# Patient Record
Sex: Female | Born: 1965
Health system: Southern US, Community
[De-identification: ages and names within clinical notes are randomized; demographics above are authoritative.]

## PROBLEM LIST (undated history)

## (undated) DIAGNOSIS — IMO0002 Reserved for concepts with insufficient information to code with codable children: Secondary | ICD-10-CM

## (undated) DIAGNOSIS — F419 Anxiety disorder, unspecified: Secondary | ICD-10-CM

## (undated) DIAGNOSIS — I10 Essential (primary) hypertension: Secondary | ICD-10-CM

## (undated) DIAGNOSIS — I219 Acute myocardial infarction, unspecified: Secondary | ICD-10-CM

## (undated) DIAGNOSIS — I779 Disorder of arteries and arterioles, unspecified: Secondary | ICD-10-CM

## (undated) DIAGNOSIS — Z90710 Acquired absence of both cervix and uterus: Secondary | ICD-10-CM

## (undated) DIAGNOSIS — R9430 Abnormal result of cardiovascular function study, unspecified: Secondary | ICD-10-CM

## (undated) DIAGNOSIS — I739 Peripheral vascular disease, unspecified: Secondary | ICD-10-CM

## (undated) DIAGNOSIS — G479 Sleep disorder, unspecified: Secondary | ICD-10-CM

## (undated) DIAGNOSIS — Z720 Tobacco use: Secondary | ICD-10-CM

## (undated) DIAGNOSIS — Z955 Presence of coronary angioplasty implant and graft: Secondary | ICD-10-CM

## (undated) DIAGNOSIS — G43909 Migraine, unspecified, not intractable, without status migrainosus: Secondary | ICD-10-CM

## (undated) DIAGNOSIS — F32A Depression, unspecified: Secondary | ICD-10-CM

## (undated) DIAGNOSIS — F329 Major depressive disorder, single episode, unspecified: Secondary | ICD-10-CM

## (undated) DIAGNOSIS — E079 Disorder of thyroid, unspecified: Secondary | ICD-10-CM

## (undated) DIAGNOSIS — Z8489 Family history of other specified conditions: Secondary | ICD-10-CM

## (undated) DIAGNOSIS — I2510 Atherosclerotic heart disease of native coronary artery without angina pectoris: Secondary | ICD-10-CM

## (undated) DIAGNOSIS — Z95828 Presence of other vascular implants and grafts: Secondary | ICD-10-CM

## (undated) DIAGNOSIS — R519 Headache, unspecified: Secondary | ICD-10-CM

## (undated) DIAGNOSIS — M1990 Unspecified osteoarthritis, unspecified site: Secondary | ICD-10-CM

## (undated) DIAGNOSIS — E785 Hyperlipidemia, unspecified: Secondary | ICD-10-CM

## (undated) DIAGNOSIS — L0591 Pilonidal cyst without abscess: Secondary | ICD-10-CM

## (undated) HISTORY — PX: CORONARY STENT PLACEMENT: SHX1402

## (undated) HISTORY — PX: FRACTURE SURGERY: SHX138

## (undated) HISTORY — DX: Anxiety disorder, unspecified: F41.9

## (undated) HISTORY — DX: Hyperlipidemia, unspecified: E78.5

## (undated) HISTORY — PX: ABDOMINAL HYSTERECTOMY: SHX81

## (undated) HISTORY — DX: Abnormal result of cardiovascular function study, unspecified: R94.30

## (undated) HISTORY — DX: Atherosclerotic heart disease of native coronary artery without angina pectoris: I25.10

## (undated) HISTORY — DX: Peripheral vascular disease, unspecified: I73.9

## (undated) HISTORY — DX: Disorder of arteries and arterioles, unspecified: I77.9

## (undated) HISTORY — PX: JOINT REPLACEMENT: SHX530

## (undated) HISTORY — DX: Reserved for concepts with insufficient information to code with codable children: IMO0002

## (undated) HISTORY — DX: Disorder of thyroid, unspecified: E07.9

## (undated) HISTORY — DX: Acquired absence of both cervix and uterus: Z90.710

## (undated) HISTORY — DX: Presence of other vascular implants and grafts: Z95.828

## (undated) HISTORY — DX: Tobacco use: Z72.0

---
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
## 1999-06-29 ENCOUNTER — Other Ambulatory Visit: Admission: RE | Admit: 1999-06-29 | Discharge: 1999-06-29 | Payer: Self-pay | Admitting: Obstetrics and Gynecology

## 2002-05-21 ENCOUNTER — Other Ambulatory Visit: Admission: RE | Admit: 2002-05-21 | Discharge: 2002-05-21 | Payer: Self-pay | Admitting: Obstetrics and Gynecology

## 2003-01-16 ENCOUNTER — Encounter (INDEPENDENT_AMBULATORY_CARE_PROVIDER_SITE_OTHER): Payer: Self-pay | Admitting: *Deleted

## 2003-01-16 ENCOUNTER — Ambulatory Visit (HOSPITAL_COMMUNITY): Admission: RE | Admit: 2003-01-16 | Discharge: 2003-01-16 | Payer: Self-pay | Admitting: Obstetrics and Gynecology

## 2004-07-24 DIAGNOSIS — Z955 Presence of coronary angioplasty implant and graft: Secondary | ICD-10-CM

## 2004-07-24 HISTORY — DX: Presence of coronary angioplasty implant and graft: Z95.5

## 2005-12-15 ENCOUNTER — Inpatient Hospital Stay (HOSPITAL_COMMUNITY): Admission: EM | Admit: 2005-12-15 | Discharge: 2005-12-20 | Payer: Self-pay | Admitting: *Deleted

## 2005-12-15 ENCOUNTER — Encounter: Payer: Self-pay | Admitting: Emergency Medicine

## 2006-01-01 ENCOUNTER — Ambulatory Visit: Payer: Self-pay | Admitting: Cardiology

## 2006-02-19 ENCOUNTER — Ambulatory Visit: Payer: Self-pay | Admitting: Cardiology

## 2006-04-19 ENCOUNTER — Ambulatory Visit: Payer: Self-pay | Admitting: Cardiology

## 2006-04-20 ENCOUNTER — Ambulatory Visit: Payer: Self-pay

## 2006-05-11 ENCOUNTER — Ambulatory Visit: Payer: Self-pay | Admitting: Cardiology

## 2006-06-01 ENCOUNTER — Ambulatory Visit: Payer: Self-pay | Admitting: Cardiology

## 2006-11-20 ENCOUNTER — Ambulatory Visit: Payer: Self-pay | Admitting: Cardiology

## 2007-06-07 ENCOUNTER — Ambulatory Visit: Payer: Self-pay | Admitting: Cardiology

## 2007-06-07 ENCOUNTER — Ambulatory Visit: Payer: Self-pay

## 2008-03-04 ENCOUNTER — Emergency Department (HOSPITAL_COMMUNITY): Admission: EM | Admit: 2008-03-04 | Discharge: 2008-03-04 | Payer: Self-pay | Admitting: Emergency Medicine

## 2008-04-01 ENCOUNTER — Ambulatory Visit: Payer: Self-pay

## 2008-04-01 ENCOUNTER — Ambulatory Visit: Payer: Self-pay | Admitting: Cardiology

## 2008-07-27 ENCOUNTER — Encounter: Admission: RE | Admit: 2008-07-27 | Discharge: 2008-07-27 | Payer: Self-pay | Admitting: Obstetrics and Gynecology

## 2008-08-07 ENCOUNTER — Inpatient Hospital Stay (HOSPITAL_COMMUNITY): Admission: RE | Admit: 2008-08-07 | Discharge: 2008-08-09 | Payer: Self-pay | Admitting: Obstetrics and Gynecology

## 2008-08-07 ENCOUNTER — Encounter (INDEPENDENT_AMBULATORY_CARE_PROVIDER_SITE_OTHER): Payer: Self-pay | Admitting: Obstetrics and Gynecology

## 2008-11-05 ENCOUNTER — Encounter: Payer: Self-pay | Admitting: Cardiology

## 2008-11-05 ENCOUNTER — Ambulatory Visit: Payer: Self-pay | Admitting: Cardiology

## 2008-11-05 ENCOUNTER — Ambulatory Visit: Payer: Self-pay

## 2008-11-11 ENCOUNTER — Telehealth (INDEPENDENT_AMBULATORY_CARE_PROVIDER_SITE_OTHER): Payer: Self-pay | Admitting: *Deleted

## 2008-11-12 ENCOUNTER — Encounter: Payer: Self-pay | Admitting: Cardiovascular Disease

## 2008-11-12 ENCOUNTER — Ambulatory Visit: Payer: Self-pay

## 2009-05-03 ENCOUNTER — Telehealth: Payer: Self-pay | Admitting: Cardiology

## 2009-05-26 ENCOUNTER — Encounter: Payer: Self-pay | Admitting: Cardiology

## 2009-05-27 ENCOUNTER — Ambulatory Visit: Payer: Self-pay

## 2009-05-27 ENCOUNTER — Ambulatory Visit: Payer: Self-pay | Admitting: Cardiology

## 2009-09-06 ENCOUNTER — Telehealth: Payer: Self-pay | Admitting: Cardiology

## 2009-10-07 ENCOUNTER — Encounter (INDEPENDENT_AMBULATORY_CARE_PROVIDER_SITE_OTHER): Payer: Self-pay | Admitting: *Deleted

## 2010-02-09 ENCOUNTER — Telehealth: Payer: Self-pay | Admitting: Cardiology

## 2010-03-14 ENCOUNTER — Encounter: Payer: Self-pay | Admitting: Cardiology

## 2010-03-15 ENCOUNTER — Ambulatory Visit: Payer: Self-pay | Admitting: Cardiology

## 2010-03-15 ENCOUNTER — Ambulatory Visit: Payer: Self-pay

## 2010-08-14 ENCOUNTER — Encounter: Payer: Self-pay | Admitting: Obstetrics and Gynecology

## 2010-08-23 NOTE — Assessment & Plan Note (Signed)
Summary: 6 month appt/mt   Visit Type:  Follow-up Primary Provider:  Phillips Odor  CC:  CAD.  History of Present Illness: The patient is seen for followup of coronary artery disease.  She is not having any significant chest pain.  She is under significant stress.  Her mother who is a patient of mine, recently fell and was injured.  She is very worried about this.  She has continued to smoke some.  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  The patient also has severe carotid artery disease.  This is followed very carefully.  Doppler done today reveals stable 60-79% bilateral disease.  Current Medications (verified): 1)  Plavix 75 Mg Tabs (Clopidogrel Bisulfate) .... Take 1 Tab By Mouth Daily 2)  Benazepril Hcl 5 Mg Tabs (Benazepril Hcl) .Marland Kitchen.. 1 Tablet By Mouth Daily 3)  Metoprolol Tartrate 25 Mg Tabs (Metoprolol Tartrate) .... Two Times A Day 4)  Aspirin 81 Mg  Tbec (Aspirin) .... 2 By Mouth Every Day 5)  Vytorin 10-40 Mg Tabs (Ezetimibe-Simvastatin) .Marland Kitchen.. 1 By Mouth Daily 6)  Alprazolam 0.5 Mg  Tabs (Alprazolam) .... Take 1 Tablet By Mouth Once A Day 7)  Nitroglycerin 0.4 Mg Subl (Nitroglycerin) .... Take 1 Tab Sl As Needed Chest Pain 8)  Metformin Hcl 500 Mg Tabs (Metformin Hcl) .Marland Kitchen.. 1 Two Times A Day 9)  Triazolam 0.25 Mg Tabs (Triazolam) .... At Bedtime As Needed  Allergies (verified): 1)  ! Pcn  Past History:  Past Medical History: CAD...cath..11/2005..DES and aspiration throbbectomy  RCA  for ST MI  /  myoview  10/2008..53%...inferior scar...no ischemia EF  55%  cath...11/2005..mild inferior hypokinesis Carotid artery disease...   doppler.Marland KitchenMarland KitchenNovember, 2010.Marland KitchenMarland Kitchen60-79% bilateral  ..stable /   Doppler.. August, 2011... stable... 60-79% bilateral disease Obesity.   smoking.   Dyslipidemia hysterectomy for very large fibroids.... 2010 Anxiety  Review of Systems       Patient denies fever, chills, headache, sweats, rash, change in vision, change in hearing, chest pain, cough, nausea  vomiting, urinary symptoms.  All other systems are reviewed and are negative.  Vital Signs:  Patient profile:   45 year old female Height:      67 inches Weight:      207 pounds BMI:     32.54 Pulse rate:   80 / minute BP sitting:   116 / 70  (left arm) Cuff size:   regular  Vitals Entered By: Hardin Negus, RMA (March 15, 2010 10:03 AM)  Physical Exam  General:  patient is stable today but tearful about her mother. Head:  head is atraumatic. Eyes:  no xanthelasma. Neck:  carotid bruits are present. Chest Wall:  no chest wall tenderness. Lungs:  lungs are clear.  Respiratory effort is nonlabored. Heart:  cardiac exam reveals S1-S2.  No clicks or significant murmurs. Abdomen:  abdomen is soft. Msk:  no musculoskeletal deformities. Extremities:  no peripheral edema. Skin:  no skin rashes. Psych:  patient is oriented to person time and place.  Affect is normal.   Impression & Recommendations:  Problem # 1:  * ANXIETY The patient has felt anxious and has historically used Xanax.  Recently she was given some Halcion to help her sleep.  I have encouraged her to use a Halcion on a very limited basis so that she does not develop reflex insomnia. I discussed the use of these medications with her at length.  Problem # 2:  TOBACCO ABUSE (ICD-305.1) Unfortunately she continues to smoke.  She is doing her  best to try to limit this.  I counseled her about this.  Problem # 3:  HYPERLIPIDEMIA (ICD-272.4)  Her updated medication list for this problem includes:    Vytorin 10-40 Mg Tabs (Ezetimibe-simvastatin) .Marland Kitchen... 1 by mouth daily The patient's lipids are treated.  Problem # 4:  CAROTID ARTERY STENOSIS, BILATERAL (ICD-433.10)  Her updated medication list for this problem includes:    Plavix 75 Mg Tabs (Clopidogrel bisulfate) .Marland Kitchen... Take 1 tab by mouth daily    Aspirin 81 Mg Tbec (Aspirin) .Marland Kitchen... 2 by mouth every day Doppler today reveals significant but stable carotid disease.  She  will have a followup study in 6 months.I reviewed the carotid Doppler report very carefully.  Problem # 5:  OVERWEIGHT (ICD-278.02) Weight loss would be helpful.  Problem # 6:  CAD (ICD-414.00)  Her updated medication list for this problem includes:    Plavix 75 Mg Tabs (Clopidogrel bisulfate) .Marland Kitchen... Take 1 tab by mouth daily    Benazepril Hcl 5 Mg Tabs (Benazepril hcl) .Marland Kitchen... 1 tablet by mouth daily    Metoprolol Tartrate 25 Mg Tabs (Metoprolol tartrate) .Marland Kitchen..Marland Kitchen Two times a day    Aspirin 81 Mg Tbec (Aspirin) .Marland Kitchen... 2 by mouth every day    Nitroglycerin 0.4 Mg Subl (Nitroglycerin) .Marland Kitchen... Take 1 tab sl as needed chest pain Coronary disease is stable.  No change in therapy at this time.  Patient Instructions: 1)  Your physician wants you to follow-up in:  6 months.  You will receive a reminder letter in the mail two months in advance. If you don't receive a letter, please call our office to schedule the follow-up appointment.

## 2010-08-23 NOTE — Miscellaneous (Signed)
  Clinical Lists Changes  Observations: Added new observation of PRIMARY MD: Assunta Found, MD (03/15/2010 13:27)

## 2010-08-23 NOTE — Progress Notes (Signed)
Summary: plavix / vytorin / benazepril  Phone Note Refill Request Message from:  Patient on February 09, 2010 3:39 PM  Refills Requested: Medication #1:  PLAVIX 75 MG TABS Take 1 tab by mouth daily  Medication #2:  VYTORIN 10-40 MG TABS 1 by mouth daily  Medication #3:  BENAZEPRIL HCL 5 MG TABS 1 tablet by mouth daily  Medication #4:  METFORMIN HCL 500 MG TABS 1 two times a day. [facility] [phone]  Initial call taken by: Judie Grieve,  February 09, 2010 3:40 PM  Follow-up for Phone Call        lmtcb metformin needs to be filled by PCP not cardiology. Hardin Negus, RMA  February 10, 2010 9:36 AM  spoke with Pt she understands  Follow-up by: Hardin Negus, RMA,  February 10, 2010 4:46 PM    Prescriptions: VYTORIN 10-40 MG TABS (EZETIMIBE-SIMVASTATIN) 1 by mouth daily  #90 x 2   Entered by:   Hardin Negus, RMA   Authorized by:   Talitha Givens, MD, [facility]   Signed by:   Hardin Negus, RMA on 02/10/2010   Method used:   Faxed to ...       [facility] (mail-order)             , [place]         Ph: [phone]       Fax: [phone]   RxID:   [id number] BENAZEPRIL HCL 5 MG TABS (BENAZEPRIL HCL) 1 tablet by mouth daily  #90 x 2   Entered by:   Hardin Negus, RMA   Authorized by:   Talitha Givens, MD, [facility]   Signed by:   Hardin Negus, RMA on 02/10/2010   Method used:   Faxed to ...       [facility] (mail-order)             , [place]         Ph: [phone]       Fax: [phone]   RxID:   [id number] PLAVIX 75 MG TABS (CLOPIDOGREL BISULFATE) Take 1 tab by mouth daily  #90 x 2   Entered by:   Hardin Negus, RMA   Authorized by:   Talitha Givens, MD, [facility]   Signed by:   Hardin Negus, RMA on 02/10/2010   Method used:   Faxed to ...       [facility] (mail-order)             , [place]         Ph: [phone]       Fax: [phone]   RxID:   [id number]

## 2010-08-23 NOTE — Progress Notes (Signed)
Summary: pt needs refill asap today  Phone Note Refill Request Call back at Work Phone [phone] Message from:  Patient  Refills Requested: Medication #1:  PLAVIX 75 MG TABS Take 1 tab by mouth daily per pt they have faxed several times and she only has six pills left and needs this refill called in today and please call her before five today at her work number listed above  Initial call taken by: Omer Jack,  September 06, 2009 2:11 PM    Prescriptions: PLAVIX 75 MG TABS (CLOPIDOGREL BISULFATE) Take 1 tab by mouth daily  #90 x 2   Entered by:   Hardin Negus, RMA   Authorized by:   Talitha Givens, MD, [facility]   Signed by:   Hardin Negus, RMA on 09/06/2009   Method used:   Electronically to        [facility]* (mail-order)             ,          Ph: [phone]       Fax: [phone]   RxID:   [id number]

## 2010-08-23 NOTE — Letter (Signed)
Summary: Appointment - Reminder 2  [facility], Main Office  [address]   [place], [place] [address]   Phone: [phone]  Fax: [phone]     October 07, 2009 MRN: [id number]   NAILA ELIZONDO [address] [place], [place]  [address]   Dear Ms. Radloff,  Our records indicate that it is time to schedule a follow-up appointment with Dr. Myrtis Ser. It is very important that we reach you to schedule this appointment. We look forward to participating in your health care needs. Please contact us at the number listed above at your earliest convenience to schedule your appointment.  If you are unable to make an appointment at this time, give [place] a call so we can update our records.     Sincerely,   Migdalia Dk [facility] Scheduling Team

## 2010-08-23 NOTE — Miscellaneous (Signed)
Summary: Orders Update  Clinical Lists Changes  Orders: Added new Test order of Carotid Duplex (Carotid Duplex) - Signed 

## 2010-09-13 ENCOUNTER — Encounter: Payer: Self-pay | Admitting: Cardiology

## 2010-09-14 ENCOUNTER — Encounter (INDEPENDENT_AMBULATORY_CARE_PROVIDER_SITE_OTHER): Payer: 59

## 2010-09-14 ENCOUNTER — Encounter: Payer: Self-pay | Admitting: Cardiology

## 2010-09-14 ENCOUNTER — Ambulatory Visit (INDEPENDENT_AMBULATORY_CARE_PROVIDER_SITE_OTHER): Payer: 59 | Admitting: Cardiology

## 2010-09-14 DIAGNOSIS — I2510 Atherosclerotic heart disease of native coronary artery without angina pectoris: Secondary | ICD-10-CM

## 2010-09-14 DIAGNOSIS — I6529 Occlusion and stenosis of unspecified carotid artery: Secondary | ICD-10-CM

## 2010-09-20 NOTE — Miscellaneous (Signed)
  Clinical Lists Changes  Observations: Added new observation of PAST MED HX: CAD...cath..11/2005..DES and aspiration throbbectomy  RCA  for ST MI  /  myoview  10/2008..53%...inferior scar...no ischemia EF  55%  cath...11/2005..mild inferior hypokinesis  /   EF 53%.. nuclear... April, 2010 Carotid artery disease...   doppler.Marland KitchenMarland KitchenNovember, 2010.Marland KitchenMarland Kitchen60-79% bilateral  ..stable /   Doppler.. August, 2011... stable... 60-79% bilateral disease Obesity.   smoking.   Dyslipidemia hysterectomy for very large fibroids.... 2010 Anxiety (09/13/2010 8:29) Added new observation of PRIMARY MD: Assunta Found, MD (09/13/2010 8:29)       Past History:  Past Medical History: CAD...cath..11/2005..DES and aspiration throbbectomy  RCA  for ST MI  /  myoview  10/2008..53%...inferior scar...no ischemia EF  55%  cath...11/2005..mild inferior hypokinesis  /   EF 53%.. nuclear... April, 2010 Carotid artery disease...   doppler.Marland KitchenMarland KitchenNovember, 2010.Marland KitchenMarland Kitchen60-79% bilateral  ..stable /   Doppler.. August, 2011... stable... 60-79% bilateral disease Obesity.   smoking.   Dyslipidemia hysterectomy for very large fibroids.... 2010 Anxiety

## 2010-09-20 NOTE — Miscellaneous (Signed)
Summary: Orders Update  Clinical Lists Changes  Orders: Added new Test order of Carotid Duplex (Carotid Duplex) - Signed 

## 2010-09-20 NOTE — Assessment & Plan Note (Signed)
Summary: [name]   Visit Type:  Follow-up Primary Provider:  Assunta Found, MD  CC:  CAD.  History of Present Illness: Patient is seen for followup of coronary artery disease.  I saw her last August, 2011.  First we discussed her mother who is my patient.  The patient did not have any chest pain.  She is trying very hard to stop smoking.  She has enormous stress at work.  She is trying to cut down to one cigarette of the time.  Currently she smokes in the range of 10 cigarettes per day.  Recently she had a cyst on her back that was drained.  Current Medications (verified): 1)  Plavix 75 Mg Tabs (Clopidogrel Bisulfate) .... Take 1 Tab By Mouth Daily 2)  Benazepril Hcl 5 Mg Tabs (Benazepril Hcl) .Marland Kitchen.. 1 Tablet By Mouth Daily 3)  Metoprolol Tartrate 25 Mg Tabs (Metoprolol Tartrate) .... Two Times A Day 4)  Aspirin 81 Mg  Tbec (Aspirin) .... 2 By Mouth Every Day 5)  Vytorin 10-40 Mg Tabs (Ezetimibe-Simvastatin) .Marland Kitchen.. 1 By Mouth Daily 6)  Alprazolam 0.5 Mg  Tabs (Alprazolam) .... Take 1 Tablet By Mouth Once A Day 7)  Nitroglycerin 0.4 Mg Subl (Nitroglycerin) .... Take 1 Tab Sl As Needed Chest Pain 8)  Metformin Hcl 500 Mg Tabs (Metformin Hcl) .Marland Kitchen.. 1 Two Times A Day 9)  Triazolam 0.25 Mg Tabs (Triazolam) .... At Bedtime As Needed 10)  Bactrim Ds 800-160 Mg Tabs (Sulfamethoxazole-Trimethoprim) .... Two Times A Day  Allergies: 1)  ! Pcn  Past History:  Past Medical History: CAD...cath..11/2005..DES and aspiration throbbectomy  RCA  for ST MI  /  myoview  10/2008..53%...inferior scar...no ischemia EF  55%  cath...11/2005..mild inferior hypokinesis  /   EF 53%.. nuclear... April, 2010 Carotid artery disease...   doppler.Marland KitchenMarland KitchenNovember, 2010.Marland KitchenMarland Kitchen60-79% bilateral  ..stable /   Doppler.. August, 2011... stable... 60-79% bilateral disease Obesity.   smoking.   Dyslipidemia hysterectomy for very large fibroids.... 2010 Anxiety..  Review of Systems       Patient denies fever, chills, headache, sweats,  rash, change in vision, change in hearing, chest pain, cough, nausea vomiting, urinary symptoms.  All other systems are reviewed and are negative.  Vital Signs:  Patient profile:   45 year old female Height:      67 inches Weight:      209 pounds BMI:     32.85 Pulse rate:   81 / minute BP sitting:   110 / 70  (left arm)  Vitals Entered By: Laurance Flatten CMA (September 14, 2010 9:12 AM)  Physical Exam  General:  The patient is stable but overweight. Head:  head is atraumatic. Eyes:  no xanthelasma. Neck:  no jugular venous distention. Chest Wall:  no chest wall tenderness. Lungs:  lungs are clear.  Respiratory effort is unlabored. Heart:  cardiac exam reveals an S1-S2.  No clicks or significant murmurs or Abdomen:  abdomen is soft. Msk:  no musculoskeletal deformities. Extremities:  no peripheral edema. Skin:  no skin rashes.  The lesion on her back is treated. Psych:  patient is oriented to person time and place.  Affect is normal.   Impression & Recommendations:  Problem # 1:  * ANXIETY The patient has a multitude of stresses.  There was an unexpected death in the family who was found to weeks after death.  She has significant stress at work.  She is doing well in general.  These stresses affect her ability to stop smoking.  We discussed this at length.  Problem # 2:  TOBACCO ABUSE (ICD-305.1) As outlined above she is trying hard to stop smoking.  Problem # 3:  CAROTID ARTERY STENOSIS, BILATERAL (ICD-433.10) Carotid disease is followed very carefully.  Problem # 4:  CAD (ICD-414.00) Coronary disease is stable.  EKG is done today and reviewed by me.  I compared with prior tracing.  There are old inferior Q waves.  No significant change. No further workup is needed at this time. Her nitroglycerin will be refilled.  Problem # 5:  HYPERLIPIDEMIA (ICD-272.4) Lipids are treated.  No change in therapy.  I will see her back in 6 months for followup.  Other Orders: EKG w/  Interpretation (93000)  Patient Instructions: 1)  Your physician recommends that you schedule a follow-up appointment in: 6 months with Dr. Myrtis Ser 2)  Your physician recommends that you continue on your current medications as directed. Please refer to the Current Medication list given to you today. Prescriptions: NITROGLYCERIN 0.4 MG SUBL (NITROGLYCERIN) Take 1 tab SL as needed chest pain  #25 x 6   Entered by:   Dossie Arbour, RN, BSN   Authorized by:   Talitha Givens, MD, [facility]   Signed by:   Dossie Arbour, RN, BSN on 09/14/2010   Method used:   Electronically to        [facility] [address] * (retail)       [address]       [place], [place]  [address]       Ph: [phone] or [phone]       Fax: [phone]   RxID:   [id number]

## 2010-09-30 ENCOUNTER — Telehealth: Payer: Self-pay | Admitting: Cardiology

## 2010-10-11 NOTE — Progress Notes (Signed)
Summary: pt having dental work need to stop some meds  Phone Note Call from Patient Call back at Work Phone [phone]   Caller: Patient Summary of Call: Pt having work on Monday and need to speak with someone about stopping meidcation prior to the dental work Initial call taken by: Judie Grieve,  September 30, 2010 9:29 AM  Follow-up for Phone Call        Phone Call Completed  PER DR Aailyah Dunbar PT  MAY HOLD PLAVIX ONLY PRIOR TO DENTAL WORK DR BEAVERS OFF AWARE AS WELL AS PT . Follow-up by: Scherrie Bateman, LPN,  September 30, 2010 10:01 AM

## 2010-11-07 LAB — COMPREHENSIVE METABOLIC PANEL
Albumin: 4.2 g/dL (ref 3.5–5.2)
Alkaline Phosphatase: 49 U/L (ref 39–117)
BUN: 4 mg/dL — ABNORMAL LOW (ref 6–23)
Calcium: 10.2 mg/dL (ref 8.4–10.5)
Glucose, Bld: 90 mg/dL (ref 70–99)
Potassium: 3.5 mEq/L (ref 3.5–5.1)
Sodium: 136 mEq/L (ref 135–145)
Total Protein: 7.5 g/dL (ref 6.0–8.3)

## 2010-11-07 LAB — URINALYSIS, ROUTINE W REFLEX MICROSCOPIC
Nitrite: NEGATIVE
Protein, ur: NEGATIVE mg/dL
Specific Gravity, Urine: <1.005 — ABNORMAL LOW (ref 1.005–1.030)
Urobilinogen, UA: 0.2 mg/dL (ref 0.0–1.0)

## 2010-11-07 LAB — DIFFERENTIAL
Basophils Relative: 0 % (ref 0–1)
Lymphocytes Relative: 32 % (ref 12–46)
Lymphs Abs: 4.3 10*3/uL — ABNORMAL HIGH (ref 0.7–4.0)
Monocytes Absolute: 1 10*3/uL (ref 0.1–1.0)
Monocytes Relative: 7 % (ref 3–12)
Neutro Abs: 7.9 10*3/uL — ABNORMAL HIGH (ref 1.7–7.7)
Neutrophils Relative %: 59 % (ref 43–77)

## 2010-11-07 LAB — CROSSMATCH
ABO/RH(D): A POS
Antibody Screen: NEGATIVE

## 2010-11-07 LAB — PROTIME-INR: INR: 0.9 (ref 0.00–1.49)

## 2010-11-07 LAB — CBC
HCT: 31 % — ABNORMAL LOW (ref 36.0–46.0)
Hemoglobin: 6.9 g/dL — CL (ref 12.0–15.0)
MCHC: 31.3 g/dL (ref 30.0–36.0)
MCHC: 31 g/dL (ref 30.0–36.0)
Platelets: 422 10*3/uL — ABNORMAL HIGH (ref 150–400)
Platelets: 284 10*3/uL (ref 150–400)
RBC: 3.1 MIL/uL — ABNORMAL LOW (ref 3.87–5.11)
RDW: 17.9 % — ABNORMAL HIGH (ref 11.5–15.5)
RDW: 17.8 % — ABNORMAL HIGH (ref 11.5–15.5)

## 2010-11-07 LAB — HEMOGLOBIN AND HEMATOCRIT, BLOOD
HCT: 27.7 % — ABNORMAL LOW (ref 36.0–46.0)
Hemoglobin: 8.4 g/dL — ABNORMAL LOW (ref 12.0–15.0)

## 2010-11-07 LAB — APTT: aPTT: 27 seconds (ref 24–37)

## 2010-11-07 LAB — ABO/RH: ABO/RH(D): A POS

## 2010-12-06 NOTE — Assessment & Plan Note (Signed)
[facility]                            CARDIOLOGY OFFICE NOTE   NAME:[name], [name]                        MRN:          [id number]  DATE:04/01/2008                            DOB:          03-09-1966    Baldo Ash (daughter of Darol Destine) is here for Cardiology  followup.  She has known significant vascular disease.  There has been  enormous stress in her family.  Her mother Darol Destine) has a tumor  around her spine for which she had surgery and is now getting  chemotherapy.  In addition, she has an uncle who died suddenly of  cardiac disease and at the funeral, another uncle had a heart attack.  With this unfortunately, she has returned to smoking some.  However, she  is on Wellbutrin and working with Dr. Nobie Putnam, and trying very hard to  stop again.   She is not having any chest pain or significant shortness of breath.  We  spoke at length about stopping smoking.  We also spoke about her lipids.  I will obtain a copy of that data from Dr. Nobie Putnam.  He may have faxed  already, but it is not available to me at this time.   PAST MEDICAL HISTORY:   ALLERGIES:  PENICILLIN.   MEDICATIONS:  Plavix, benazepril, metoprolol, aspirin, Vytorin,  Wellbutrin, and Xanax.   OTHER MEDICAL PROBLEMS:  See the complete list on my note of June 07, 2007.   REVIEW OF SYSTEMS:  She actually is feeling well and is having no  significant problems other than the HPI.  Latese was noted that she had  some drooping of the left face.  She was seen urgently with a CT scan of  the head revealing no marked abnormality and a clinical diagnosis made  of Bell palsy.  It is already improving.   PHYSICAL EXAMINATION:  VITAL SIGNS:  Weight today is 221 pounds, which  is increased by 3 pounds since her last visit.  Blood pressure is 110/70  with a pulse of 88.  GENERAL:  The patient is oriented to person, time, and place.  Affect is  normal.  She is  significantly overweight.  HEENT:  Mild xanthelasma.  She has normal extraocular motion.  Examination of the patient's face reveals that there is very slight  differentiation between the left and right side of her mouth.  This is  quite subtle.  NECK:  There are no carotid bruits.  There is no jugular venous  distention.  LUNGS:  Clear.  Respiratory effort is not labored.  CARDIAC:  S1 with an S2.  There are no clicks or significant murmurs.  ABDOMEN:  Soft.  EXTREMITIES:  She has no peripheral edema.  the patient did have an  episode of Bell palsy.   LABORATORY DATA:  EKG reveals no significant change.  The patient had a  carotid Doppler today.  There is a preliminary result stating that she  has 60-79% bilateral stenoses with no significant change, but she needs  early followup in 6 months and this will be arranged.  PROBLEMS:  1. Coronary artery disease.  She is stable with no change in her meds.  2. Obesity.  She clearly needs to lose weight.  3. History of cigarette smoking.  She had stopped and has restarted      and is trying very hard again to stop.  4. Bilateral carotid stenoses.  This appears to be stable and we need      continued aggressive secondary prevention.  5. Elevated lipids.  We need her labs from Dr. Geanie Logan.  Also, a      fish oil is recommended with the dose as high as 4 g combined of      EPA and DHA.     Luis Abed, MD, [facility]  Electronically Signed    JDK/MedQ  DD: 04/01/2008  DT: 04/01/2008  Job #: [id number]   cc:   Patrica Duel, M.D.

## 2010-12-06 NOTE — Assessment & Plan Note (Signed)
[facility]                            CARDIOLOGY OFFICE NOTE   NAME:Ebeling, ALYZAE HAWKEY                        MRN:          [id number]  DATE:06/07/2007                            DOB:          1966/04/14    Ms. Mcniel is the daughter of Darol Destine seen for cardiology  followup.  She is stable.  She is not having any chest pain. We are  following her carotid Doppler's very carefully.  She had a study done  today and her 60-79% stenoses are stable.  She has a loud right carotid  bruit.  She is not having any chest pain.  She is going about her full  activities. She continues to have some GYN problems and eventually she  may have surgery.  We are not at a point where Plavix can be held when  she needs surgery.  However, she has said that she clearly wants to lose  some weight before she undertakes anything else.   PAST MEDICAL HISTORY:   ALLERGIES:  PENICILLIN.   MEDICATIONS:  Plavix, benazepril, Metoprolol, aspirin and Vytorin 10/40.   OTHER MEDICAL PROBLEMS:  See the list below.   REVIEW OF SYSTEMS:  She does have difficulty with some GYN issues and  she needs a partial hysterectomy and she will be cleared to have this  from the cardia viewpoint over time.  We are far enough out now that  Plavix will be able to be stopped when she needs the procedure.  Otherwise her review of systems is negative.   PHYSICAL EXAMINATION:  Weight is 217 pounds. She has lost some weight  and then gained it again and she says she is now motivated although it  is very difficult while she attends school and works.  Blood pressure  126/82 with a pulse of 85.  The patient is oriented to person, time and  place and her affect is normal.  HEENT:  Reveals slight xanthelasma. She has normal extraocular motion.  There is a loud right carotid bruit.  There is no jugular venous  distension.  LUNGS:  Clear. Respiratory effort is not labored.  CARDIAC EXAM:  Reveals an S1  with an S2.  There are no clicks or  significant murmurs.  ABDOMEN:  Obese but soft.  EXTREMITIES:  She has no significant peripheral edema.   PROBLEMS:  1. Coronary disease.  We need to be very aggressive with her      treatment.  She had a Cypher stent. She is now greater than 1 year      out from this.  We will be able to hold Plavix for any needed      surgical procedures but otherwise I want to continue her Plavix.  2. Obesity.  She clearly needs to lose weight.  3. Hypertension - controlled.  4. History of cigarette use that was stopped.  5. Situational depression after her MI which clearly has improved.  6. History of a cough which is improved. At one point there was a      question that it could be from  an ACE but she is on benazepril and      she does not have an ACE cough.  7. Bilateral 60-79% carotid stenoses.  There is very careful followup      of this.  8. Potential need for GYN surgery.  When she and Dr. Donovan Kail are      ready for this she can be cleared to hold her Plavix at that time.  9. Elevated lipids - on Vytorin now 10/40.   I have not changed her medications.  We can stop her Plavix if and when  she needs the GYN procedure.  She will have a flu shot today. She will  have carotid Doppler followup in 6 months.     Luis Abed, MD, [facility]  Electronically Signed    JDK/MedQ  DD: 06/07/2007  DT: 06/07/2007  Job #: [id number]   cc:   Patrica Duel, M.D.  Miguel Aschoff, M.D.

## 2010-12-06 NOTE — Op Note (Signed)
NAME:  [name], [name]                 ACCOUNT NO.:  [id number]   MEDICAL RECORD NO.:  [id number]          PATIENT TYPE:  INP   LOCATION:  9371                          FACILITY:  [facility]   PHYSICIAN:  Miguel Aschoff, M.D.       DATE OF BIRTH:  08-06-1965   DATE OF PROCEDURE:  DATE OF DISCHARGE:                               OPERATIVE REPORT   PREOPERATIVE DIAGNOSES:  Massive uterine fibroids, menorrhagia.   POSTOPERATIVE DIAGNOSES:  Massive uterine fibroids, menorrhagia.   PROCEDURE:  Total abdominal hysterectomy and right salpingo-  oophorectomy.   SURGEON:  Miguel Aschoff, MD   ASSISTANT:  Luvenia Redden, MD   ANESTHESIA:  General.   COMPLICATIONS:  None.   JUSTIFICATION:  The patient is a 45 year old white female noted to have  a very large fibroids, 18 to 20 weeks' equivalent size associated with  menorrhagia.  Attempts had been made in the past to control her  menorrhagia conservatively with Mirena IUD because of a history of prior  myocardial infarction; however, at this point the fibroids have now  become so large, and the pressure and pain associated with them has  reached a degree that the patient has requested definitive therapy with  hysterectomy.  The patient has received cardiology clearance for this  procedure.  The risks and benefits were discussed with the patient.  Informed consent has been obtained.   PROCEDURE:  The patient was taken to the operating room, placed in the  supine position.  General anesthesia was administered without  difficulty.  She was then placed in the supine position, prepped and  draped in the usual sterile fashion.  Foley catheter was inserted.  At  this point, a midline incision was made from the symphysis pubis to the  umbilicus, extended down through subcutaneous tissue, and bleeding  points were clamped and coagulated as they were encountered.  The fascia  was then identified and incised vertically.  Once this was done, the  midline was  found.  Peritoneum was revealed and entered carefully  avoiding underlying structures.  Peritoneal incision was then extended  under direct visualization.  On entering the abdomen, again it was  obvious that the patient had previous massive fibroids again extending  to the umbilicus.  A self-retaining retractor was placed through the  wound.  It was repacked out of the pelvis.  At this point, the utero-  ovarian ligaments were identified, doubly clamped, cut, and suture  ligated using suture ligatures of 0 Vicryl.  The round ligaments,  fallopian tubes were clamped in a similar fashion, cut, and suture  ligated using suture ligatures of 0 Vicryl.  Additional bites were then  taken along these large fibroids by clamping the broad ligament  structures.  All pedicles were cut and suture ligated using suture  ligatures of 0 Vicryl.  It was possible to continue the dissection down  to the level of the uterine vessels.  These were then clamped with  curved Heaney clamps, and at this point, the fundus was excised to allow  better visualization and to allow the  cervix to be removed.  These were  repacked out of the pelvis once the fundus was excised.  Kocher clamps  were used to elevate the uterus, and then using straight Heaney clamps  the paracervical fascia was clamped, cut, and suture ligated using  suture ligatures of 0 Vicryl.  The cardinal ligaments were clamped, cut,  and suture ligated in similar fashion followed by the uterosacral  ligaments.  Once this was done, it was possible to excise the cervix  from the vaginal fornices.  The vagina was then elevated and closed  using running interlocking 0 Vicryl suture.  Inspection was made for  hemostasis.  There appeared to be some oozing in the area of the right  ovary as well as what appeared to be a very cyanotic fallopian tube.  In  an effort to avoid any postoperative complications, I elected to proceed  with removal of the right tube and  ovary.  The left tube and ovary were  completely within normal limits.  The infundibulopelvic ligament was  then identified, elevated, clamped, cut, and then doubly ligated using  two ligatures of 0 Vicryl.  The uterine specimen and cervix weighed more  than 1600 g.  At this point, a final inspection was made for hemostasis.  Hemostasis appeared to be excellent.  Lap counts and instrument counts  were then taken and found to be correct, and it was elected to close the  abdomen.  The parietal peritoneum was closed using running interlocking  0 Vicryl suture.  The fascia was closed using double-stranded PDS in a  running continuous fashion.  Subcutaneous tissue was closed using  interrupted 0 plain gut.  The skin incision was closed using staples.  The estimated blood loss from the procedure was 350 mL.  The patient  tolerated the procedure well.  Because of the patient's history of  myocardial infarction and what appeared to be ST-segment depression, at  the end of the procedure the patient will be observed in the ICU unit,  and if cardiology consultation is needed, her cardiologist, Dr. Willa Rough, will be contacted.      Miguel Aschoff, M.D.  Electronically Signed     AR/MEDQ  D:  08/07/2008  T:  08/08/2008  Job:  [id number]

## 2010-12-07 ENCOUNTER — Other Ambulatory Visit: Payer: Self-pay | Admitting: *Deleted

## 2010-12-07 MED ORDER — EZETIMIBE-SIMVASTATIN 10-40 MG PO TABS: 1.0000 | ORAL_TABLET | Freq: Every day | ORAL | Status: DC

## 2010-12-09 NOTE — Assessment & Plan Note (Signed)
[facility]                              CARDIOLOGY OFFICE NOTE   [name], [name]                        MRN:          [id number]  DATE:02/19/2006                            DOB:          01-19-1966    [name] is seen for follow-up.  She is not having any significant  recurring angina.  She underwent staged procedure to occluded right coronary  artery, followed by opening of her LAD in late May 2007.  She stopped  smoking.  She has had significant weight gain.  She has had some depression  but she is doing better.   PAST MEDICAL HISTORY:   ALLERGIES:  PENICILLIN.   MEDICATIONS:  1.  Plavix 75 mg.  2.  Benazepril (to be changed to Cozaar).  3.  Metoprolol 25 mg b.i.d.  4.  Zocor 40 mg.  5.  Aspirin 325 mg.   OTHER MEDICAL PROBLEMS:  See the list below.   REVIEW OF SYSTEMS:  She has had some vague symptoms but is doing well.  She  does have a dry cough that could possibly be ACE inhibitor-related.  Also,  she has weight gain.  We want to get her to cardiac rehab soon for multiple  reasons.  Otherwise, her review of systems is negative.   PHYSICAL EXAMINATION:  VITAL SIGNS:  Blood pressure is 120/80, pulse is 86.  GENERAL:  The patient is oriented to person, time and place, and her affect  was normal.  LUNGS:  Normal.  Respiratory effort is not labored.  HEENT:  No xanthelasma.  She has normal extraocular motion.  There is no  carotid bruit.  There is no jugular venous distention.  CARDIAC:  An S1 with an S2.  She has no clicks or significant murmurs.  ABDOMEN:  Obese but soft.  EXTREMITIES:  She has no significant peripheral edema.   No labs are done.   PROBLEMS:  1.  Coronary artery disease, premature at age 45.  She is on lipid-lowering      agents.  I need to check her lipids to make decisions about her dosing.  2.  Status post totally occluded right coronary with collaterals and urgent      intervention with a Cypher stent.  3.  Status post staged procedure on Dec 19, 2005, with percutaneous coronary      intervention to the left anterior descending artery.  4.  Obesity.  She is gaining weight, and I hope we can help her with this      with nutritional help.  5.  History of hypertension.  6.  Cigarette abuse, which she stopped.  7.  Situational depression, which is improving.  8.  Cough, question ACE-related.  We will probably change her to Cozaar.                               Luis Abed, MD, [facility]    JDK/MedQ  DD:  02/19/2006  DT:  02/20/2006  Job #:  [id number]  cc:   Patrica Duel, MD

## 2010-12-09 NOTE — Assessment & Plan Note (Signed)
[name]                              CARDIOLOGY OFFICE NOTE   NAME:[name], [name]                        MRN:          [id number]  DATE:04/19/2006                            DOB:          October 13, 1965    [name] is seen for followup.  I know her well concerning her coronary  disease and also her mother [name] that we followcarefully.)  In  May 2007 the patient had a staged procedure that included treating an  occluded right coronary artery and then a staged procedure of opening her  LAD.  She did receive a drug-eluting stent.  She stopped smoking.  She is  trying to begin to lose weight.  Her depression is improving.  She is going  to cardiac rehab and learning about her diet and other issues.  She is not  having any chest pain or shortness of breath.  I had a long discussion with  her about the fact that she needs to have a GYN surgery.  See the discussion  below.   PAST MEDICAL HISTORY:   ALLERGIES:  PENICILLIN.   MEDICATION:  1. Plavix 75.  2. Benazepril 5.  3. Metoprolol 25 b.i.d.  4. Aspirin 325.   OTHER MEDICAL PROBLEMS:  See the list below.   REVIEW OF SYSTEMS:  She does not have any chest pain.  She is having  significant GYN symptoms and this has been a chronic problem.  She is now  willing to consider approaching this with possible surgery and she is to see  Dr. Miguel [name] about this.  Otherwise her review of systems is negative.   PHYSICAL EXAMINATION:  Blood pressure in her left arm with a large cuff is  110/76.  Her pulse is 82.  Patient is oriented to person, time and place.  Affect is normal.  LUNGS:  Are clear. Respiratory effort is not labored.  HEENT:  Reveals no xanthelasma.  She has normal extraocular motion.  Today I hear a bruit in the right carotid that I have not heard before.  She  will need a Doppler for this.  There is no jugular venous distention.  CARDIAC:  Reveals an S1 with an S2.  There are  no clicks or significant  murmurs.  ABDOMEN:  Is soft.  There are no masses or bruits.  Her weight today is 207 pounds the same as at the time of her last visit.  She has no peripheral edema.  There are no musculoskeletal deformities.  NEUROLOGIC:  Is grossly intact.   No labs are done today.   PROBLEMS:  1. Coronary disease in a young patient.  She had occluded right coronary      with collaterals that was treated with an urgent intervention with a      Cypher stent and then she had a followup intervention to her left      anterior descending on Dec 19, 2005.  In my prior notes I had listed      that she was on Zocor.  Today she does not  have this medication with      her and it is very important that we get her back onto Zocor and      aggressively approach her lipids.  2. Obesity.  She is beginning to work on this.  3. History of hypertension.  Her blood pressure is controlled at this      time.  4. Cigarette abuse which she stopped at the time of her myocardial      infarction.  5. Situational depression.  This is clearly improving.  6. Cough.  It was questioned of it being angiotensin-converting enzyme      related.  We considered changing her to Cozaar, however she continues      on benazepril and her cough is improved.  7. Right carotid bruit.  She needs a Doppler.  8. Need for a gynecological surgery.  This issue was carefully reviewed      and discussed with the patient at length.  Her initial procedures were      done in May 2007.  I would want her on Plavix for at least a year.  For      certain she needs to be on Plavix for 6 months before any consideration      could be given to holding it for any type of procedure.  My first      choice would be for her not to hold her Plavix at all.  She will be      seeing Dr. Tenny [name] to see if she needs the procedure and if the procedure      can be done with her on aspirin and Plavix.   We will work with her depending on what she  finds from Dr. Tenny [name].   I will see her for followup in 2 months.                               Diana Abed, MD, [facility]    JDK/MedQ  DD:  04/19/2006  DT:  04/19/2006  Job #:  [id number]   cc:   [name], M.D.  [name], M.D.

## 2010-12-09 NOTE — Assessment & Plan Note (Signed)
[facility]                              CARDIOLOGY OFFICE NOTE   NAME:Batts, [name]                        MRN:          [id number]  DATE:06/01/2006                            DOB:          07/16/1966    [name] is seen for followup (daughter of [name]).  She is doing  well.  She has not had any recurrent chest pain.  She had a staged procedure  that included treating an occluded right and then a staged procedure to her  LAD.  She was somewhat depressed afterwards and this is improving.  She has  taken on a very good attitude and is actively working towards her secondary  prevention.  She has been seeing Dr. Miguel Aschoff for careful follow up of her  GYN problems.  In speaking with her today she tells me of options that were  given to her about the approach to her GYN problems  At this point, I  believe that she is going to agree to the simple approach with some time of  insert and therefore not need surgery.  I am certainly in favor of this as I  do not want to stop her Plavix.  Also she has carotid bruits and we did  carotid Dopplers.  She has bilateral 60% to 79% stenoses.  She needs follow  up in 6 months.  She needs aggressive approach to her lipids which we are  doing.   PAST MEDICAL HISTORY:   ALLERGIES:  PENICILLIN.   MEDICATION:  1. Plavix 75.  2. Benazepril 5.  3. Metoprolol 25 b.i.d.  4. Simvastatin 40.  5. Aspirin 81.   OTHER MEDICAL PROBLEMS:  See the extensive list on my note of April 19, 2006.   REVIEW OF SYSTEMS:  She is doing well.  She has some days when she feels  fatigued.  She has had some vague chest and neck pain, but we both agreed  that these are not of significance at this time, otherwise her review of  systems is negative.   PHYSICAL EXAMINATION:  Blood pressure today is 124/72.  Her weight is 207.  She is working on losing weight.  Her pulse is 85.  Patient is oriented to person, time and  place and her affect is normal.  HEENT:  Reveals no xanthelasma.  She has normal extraocular motion.  She has  bilateral parotid bruits.  There is no jugular venous distention.  LUNGS:  Are clear. Respiratory effort is not labored.  CARDIAC:  Reveals an S1 with an S2.  There are no clicks or significant  murmurs.  ABDOMEN:  Is soft but obese.  She has no significant peripheral edema.   LABS:  See the description above about her carotid Dopplers.  Her lipid  profile done on May 11, 2006 (patient on Simvastatin 40 since April 19, 2006) reveal a cholesterol decreasing to 136.  Triglycerides decreasing  to 78.  HDL up slightly to 27 and LDL down from 142 to 94.  This is a good  direction but we  need to be even more aggressive.   PROBLEMS:  Include:  1. Coronary disease.  In this very young patient, I am being very      aggressive in her followup.  She had Cipher stent and we will do      everything we can to keep her from coming off Plavix.  We will be      aggressive with her secondary prevention.  2. Obesity.  She continues to work on losing weight.  3. Hypertension - controlled.  4. History of cigarette abuse that she stopped at the time of her      myocardial infarction.  5. Situational depression.  This continues to improve.  We talked about      several things and she is clearly doing better.  6. Cough.  This is improved.  We thought it might be related to an ACE but      eventually she improved and we have kept her on Benazepril.  7. Bilateral carotid disease.  She has bilateral 60% to 80% stenoses.      Follow up will be in 6 months.  8. Need for gynecological surgery.  I discussed with her all of the      approaches and she will be following up with Dr. Tenny Craw.  At this time we      will be able to keep her on her Plavix.  Certainly in the future if she      were to need a more aggressive approach, this could be considered but      not at this time on an elective basis,  as I do not want to stop her      Plavix.  9. We will change her simvastatin to Vytorin 10/40.  10.I will see her back in 3 months for followup.    ______________________________  Luis Abed, MD, [facility]    JDK/MedQ  DD: 06/01/2006  DT: 06/01/2006  Job #: [id number]   cc:   Miguel Aschoff, M.D.  Patrica Duel, M.D.

## 2010-12-09 NOTE — Op Note (Signed)
NAME:  [name], [name]                           ACCOUNT NO.:  [id number]   MEDICAL RECORD NO.:  [id number]                   PATIENT TYPE:  AMB   LOCATION:  SDC                                  FACILITY:  [facility]   PHYSICIAN:  Miguel Aschoff, M.D.                    DATE OF BIRTH:  02/09/1966   DATE OF PROCEDURE:  01/16/2003  DATE OF DISCHARGE:                                 OPERATIVE REPORT   PREOPERATIVE DIAGNOSIS:  Menorrhagia.   POSTOPERATIVE DIAGNOSIS:  Menorrhagia.   PROCEDURE:  D and C and endometrial cryoablation.   SURGEON:  Miguel Aschoff, M.D.   ANESTHESIA:  General.   COMPLICATIONS:  None.   JUSTIFICATION:  The patient is a 44 year old white female with history of  progressive menorrhagia unresponsive to treatment with medial therapy.  The  patient has requested the procedure be performed to try to control the  bleeding and to try to avoid any major surgery.  She was given informed  consent for D and C and endometrial cryoablation to try to achieve these  goals.  The risks and benefits of the procedure were discussed with the  patient.   PROCEDURE:  The patient was taken to the operating room, placed in the  supine position.  General anesthesia was administered without difficulty.  She was then placed in the dorsal lithotomy position and prepped and draped  in the usual sterile fashion.  The bladder was catheterized.  The speculum  was placed in the vaginal vault.  The anterior cervical lip was grasped with  tenaculum and the endocervical canal was dilated using serial Pratt  dilators.  Vigorous sharp curettage was carried out of the endometrial  cavity and then a moderate to large amount of endometrial curettings was  obtained.  These were sent for histologic study.  The cavity sounded to 11  cm.  Once the curettage was carried out, the cryoablation kit was placed in  the right cornual portion of the uterus.  Saline was injected and then a  freeze cycle was carried out  for six minutes.  Then the procedure was  repeated for six minutes in the left cornual region of the uterus and across  the uterine sides.  A third freeze cycle was done in the center portion of  the uterus.  The freeze cycles were done without difficulty.  The patient  tolerated the procedure well.  The estimated blood loss was approximately 50  cc.  At the end of the procedure all instruments were removed.  The patient  was reversed from the anesthetic and taken to the recovery room in  satisfactory condition.   The plan is for the patient to be discharged home.  Medications for home  include Cipro 250 mg b.i.d. x three days, Darvocet-N 100 one every four  hours as needed for pain.  The patient did share a  history of insomnia, and  she is being sent home with Ambien 10 mg p.o. h.s. p.r.n. sleep.  She will  be seen back in four weeks for followup examination.  She is to call if  there are any problems such as breakthrough pain or heavy bleeding.                                               Miguel Aschoff, M.D.   AR/MEDQ  D:  01/16/2003  T:  01/17/2003  Job:  [id number]

## 2010-12-09 NOTE — Discharge Summary (Signed)
[name], [name]                 ACCOUNT NO.:  [id number]   MEDICAL RECORD NO.:  [id number]          PATIENT TYPE:  INP   LOCATION:  9371                          FACILITY:  [facility]   PHYSICIAN:  Miguel Aschoff, M.D.       DATE OF BIRTH:  10-10-1965   DATE OF ADMISSION:  08/07/2008  DATE OF DISCHARGE:  08/09/2008                               DISCHARGE SUMMARY   ADMISSION DIAGNOSES:  1. Massive uterine fibroids.  2. Anemia.  3. History of prior myocardial infarction.   BRIEF HISTORY:  The patient is a 45 year old white female, who has had a  history of uterine fibroids.  In addition, at age 29, the patient had a  myocardial infarction.  Attempts were made to try to control her heavy  bleeding, using Mirena IUD and while this did cause some resolution in  the amount of vaginal bleeding, the patient was noted on examination to  have her uterus now up to the level of the umbilicus.  Because of the  size of the uterus and symptoms associated with it, she desired a  definitive procedure to be carried out to resolve the fibroids.  After  receiving cardiology clearance with Dr. Willa Rough, the patient was  admitted to the hospital to undergo total abdominal hysterectomy.  Preoperative studies were obtained which yielded an admission hemoglobin  of 9.7.  The patient underwent a total abdominal hysterectomy and right  salpingo-oophorectomy on August 07, 2008.  The procedure was carried  out without difficulty, but on closing the abdomen, the patient did  develop ST-segment depression noted by the Anesthesiologist on her EKG  tracing.  Because of this patient, the patient was observed in the Adult  ICU Unit at [facility] to be monitored.  There was resolution of  the ST-segment depression and no further abnormalities noted.  The  patient did have a drop in hemoglobin to 6.9 and it was felt because of  her prior history of myocardial infarction, and now impaired oxygen  carrying capacity  due to her significant postop anemia.  The patient  should be transfused.  The patient did receive 2 units of packed cells.  This was done without any complication with informed consent and the  patient was feeling much better by the second postoperative day  following the transfusion.  She is felt to be in satisfactory condition  at this point and able to be discharge to home.  Her medications for  home included Tylox 1 every 3 hours needed for pain.  She was instructed  to be obtain her aspirin therapy on the day following her discharge, and  to resume her Plavix therapy on August 12, 2008.  The patient will be  seen back in our office on August 13, 2008 to undergo staple removal.  The pathology report on the hysterectomy specimen showed the uterus and  cervix when weighed in the operating room to weigh 1683 g.  The  pathology report showed secretory endometrium, multiple intramural, and  submucosal, and subserosal leiomyomas, benign right fallopian tube, and  benign right ovary  with stromal hemorrhage.  The patient was sent home  in satisfactory condition on a regular diet.      Miguel Aschoff, M.D.  Electronically Signed     AR/MEDQ  D:  08/13/2008  T:  08/14/2008  Job:  [id number]

## 2010-12-09 NOTE — Cardiovascular Report (Signed)
NAME:  Diana, [name]                 ACCOUNT NO.:  [id number]   MEDICAL RECORD NO.:  [id number]          PATIENT TYPE:  INP   LOCATION:  3799                         FACILITY:  [facility]   PHYSICIAN:  Richard A. Alanda Amass, M.D.DATE OF BIRTH:  1965-07-27   DATE OF PROCEDURE:  12/19/2005  DATE OF DISCHARGE:                              CARDIAC CATHETERIZATION   PROCEDURE:  Retrograde central aortic catheterization, selective coronary  angiography via Judkins technique, intracoronary nitroglycerin  administration, percutaneous coronary intervention with percutaneous  transluminal coronary angioplasty and stent, high-grade left anterior  descending stenosis, post diagonal 2, Cypher 3.0/13 high-pressure inflation,  side branch diagonal 2 ostial percutaneous transluminal coronary  angioplasty, 2.5 balloon.  Double bolus Aggrastat plus infusion, weight-  adjusted heparin, continued aspirin and Plavix.   BRIEF HISTORY:  [name] is a 45 year old married mother of 2 children, ages 71  and 73, a smoker.  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  Has a family  history of coronary disease, unknown cholesterol status and a prior history  of a D&C for menorrhagia.  She was admitted Dec 15, 2005 with ongoing acute  inferior wall myocardial infarction with antecedent 1-week history of  radiating mid-substernal chest pain to throat and arms.  She was admitted  with prolonged episode of chest pain after no improvement with GI medication  with inferior ST-segment elevation MI.  She was seen by Dr. Domingo Sep and  referred to Dr. Eldridge Dace for emergency catheterization, which was done Dec 15, 2005.  She had total occlusion of the mid RCA, which was stented with a  2.5/18 Cypher stent after aspiration thrombectomy with an Export catheter  deployed and postdilated with a 3.0 balloon.  LV showed mid-to-basilar  inferior hypokinesis with EF of approximately 50%.  She was started with  IIb/IIIa inhibitors, loaded with  Plavix and given aspirin therapy.  She has  done well postprocedure without evidence of RV infarct, borderline but  stable blood pressures and no recurrent chest pain.  She is brought back to  the laboratory now for known high-grade 90% mid LAD lesion just beyond the  diagonal 2 for staged PCI.  Informed consent was obtained to proceed with  the procedure.  CPKs post MI peaked at 2025 with MB of 216, troponin of 33.  LDL was 142, cholesterol 196.   PROCEDURE:  The right groin was prepped, draped in usual manner, 1%  Xylocaine was used for local anesthesia.  The RCFA was entered with a single  anterior puncture using an 18 thin-wall needle and a 6-French short Daig  sidearm sheath was inserted without difficulty.  The patient was given  weight-adjusted heparin, continued on aspirin and Plavix (see orders).  ACT  was therapeutic.  Aggrastat double-bolus plus infusion was be done,  monitoring ACTs.  Selective right coronary angiography was done with a 6-  French 4-cm taper diagnostic right coronary catheter.  This demonstrated  widely patent stent just beyond the acute marginal and the RV the  midportion.  There was 0% narrowing.  Beyond the stent, there was 30% smooth  eccentric narrowing or  less with normal flow to a trifurcating PLA and a  large PDA with no significant stenosis in either branch.   There was 30% to 40% mildly eccentric narrowing in the proximal third of the  RCA, mildly segmental.  There was no ostial stenosis on flush injection.   Left coronary angiography was done with a 6-French 3.5-cm JL Cordis guiding  catheter.  This demonstrated a normal main, left, a thin first diagonal  without significant disease followed by 2 large septal perforators and then  a large second diagonal from the junction of mid third of the LAD.  The  second diagonal had about 20% narrowing in the proximal portion.  Beyond the  second diagonal was 90% concentric mildly segmental narrowing of the  LAD.  A  small third diagonal from the distal third of the LAD where the LAD went to  the apex.   The circumflex was nondominant with a large bifurcating first marginal, a  small A-V groove branch and a small PABG branch.  There was less than 30%  narrowing in the proximal circumflex.   The LAD lesion was crossed with 0.014-inch Asahi soft guidewire.  The LAD  lesion was dilated across the diagonal 2 ostia with a 2.5/12 Voyager and 7-  35.  The ostium of the diagonal remained intact.  It was then exchanged for  DES 3.0/ 13 Cypher stent, which was positioned across the diagonal 2 to  cover the stenosis and the mild disease proximal to the diagonal 2.  It was  deployed at 16-30, post-dilated 18-30.  The balloon was removed and the  diagonal 2 ostium had a 80% to 90% pinched narrowing.  The LAD stent was  widely patent with good flow.  The wire was pulled back and went through the  stent struts, crossed into the diagonal 2 and the diagonal 2 ostia was  dilated carefully with 2.5/12 Voyager at 6/20 and 6/27.  Balloon was pulled  back.  There was less than 20% narrowing of the diagonal 2 ostia with normal  flow, no dissection and 0% to less than 10% narrowing of the LAD stent  across the diagonal 2.  Dilatation system was removed, side-arm sheath was  flushed and the patient was brought to the holding area for ventral sheath  removal when ACT normalizes.  She will be continued on IIb/IIIa inhibitors  for 18 hours, continued on aspirin and Plavix and associated therapy of her  newly diagnosed coronary disease.  Smoking cessation is strongly  counseled.   CATHETERIZATION DIAGNOSES:  1.  Arteriosclerotic heart disease -- premature.  2.  Acute ST-elevation inferior myocardial infarction, Dec 15, 2005, treated      with emergency percutaneous transluminal coronary angioplasty and DES      stent in the mid right coronary artery with aspiration thrombectomy by     Dr. Eldridge Dace, successful,  reperfusion time not indicated in available      notes, but currently prompt, since this was a morning procedure.  3.  Left ventricular dysfunction and inferior hypokinesis.  Left ventricular      angiogram on this study shows improved inferior wall motion abnormality      with only minimal hypokinesis and ejection fraction approximately 55%      with no mitral regurgitation.  4.  Hyperlipidemia.  5.  External oblique.  6.  Family history of coronary disease.  7.  Systemic hypertension.  8.  Cigarette abuse.      Richard A. Alanda Amass,  M.D.  Electronically Signed     RAW/MEDQ  D:  12/19/2005  T:  12/19/2005  Job:  [id number]   cc:   [facility]   Dani Gobble, MD  Fax: [phone]   Corky Crafts, MD  Fax: [phone]   [place] Ace, [place] Susa Griffins MD   Patrica Duel, M.D.  Fax: [phone]

## 2010-12-09 NOTE — Discharge Summary (Signed)
NAMEJERAH, [name]                 ACCOUNT NO.:  [id number]   MEDICAL RECORD NO.:  [id number]          PATIENT TYPE:  INP   LOCATION:  6523                         FACILITY:  [facility]   PHYSICIAN:  Cristy Hilts. Jacinto Halim, MD       DATE OF BIRTH:  Sep 16, 1965   DATE OF ADMISSION:  12/15/2005  DATE OF DISCHARGE:  12/20/2005                                 DISCHARGE SUMMARY   [name] is a 45 year old white female who came to the ER with complaints  of chest pain.  She was seen by Dr. Edward Qualia and she was placed on IV  heparin, IV nitroglycerin, and Integrilin.  Her pain continued.  Thus, she  was taken originally to cardiac catheterization lab.  The cath was performed  by Dr. Latrelle Dodrill, who is in the interventionalist on call and she underwent  Cypher stenting 2.5 x 18 for an occluded RCA.  She also had LAD stenosis of  70% to 80%, at the mid vessel at the origin of the diagonal tube.   Post procedure, she did well.  She was seen by cardiac rehab.  It was  decided she should undergo staged procedure and on Dec 19, 2005, she had a  Cypher stent 3.0 x 13 to her mid LAD placed by Dr. Susa Griffins.  On  Dec 20, 2005, she was stable, blood pressure was 102/58.  Her labs were  within normal limits and it was decided that she could be discharged home  day #5 of her MI.   LABORATORY DATA:  Currently there are no complete labs in this chart.  On  her labs on Dec 20, 2005, she had a hemoglobin of 10.7, hematocrit of 31.7,  platelets of 311, WBC 12.3.  Sodium was 140, potassium 3.7, BUN 11,  creatinine was 0.8, and her glucose was 103.  Her CK-MB was 161/2.9.   DISCHARGE MEDICATIONS:  1.  Zocor 40 mg at bedtime.  2.  Aspirin 325 mg once a day.  3.  Plavix 75 mg 1 time per day.  She is not to stop.  4.  Metoprolol 25 mg once a day.  5.  Wellbutrin 150 mg 2 times per day.  6.  Protonix 40 mg 1 time per day.  7.  Benazepril 5 mg 1 time per day.  8.  Nitroglycerin 1 under tongue as needed for chest  pain.  9.  Ativan 0.5 mg 1 every 8 hours when needed.   DISCHARGE INSTRUCTIONS:  She is not to return to work until she is seen in  the office.  She should follow her cardiac rehab instructions.  She should  be on a low saturated, low fat diet, and she has a followup with Dr.  Alanda Amass on January 02, 2006, at 11:30.   DISCHARGE DIAGNOSES:  1.  Acute myocardial infarction with urgent cardiac catheterization with      findings of total right coronary artery with DES stent placement, 2.5 x      18 Cypher stenting.  She had a staged procedure with a 70% to 80% mid  left anterior descending stenosis, with Cypher stenting placed on Dec 19, 2005.  2.  Coronary artery disease, discussed the findings as above.  3.  Ejection fraction of 55%.  4.  Dyslipidemia on statin medication.  5.  Tobacco use.  6.  Gastroesophageal reflux disease.      Lezlie Octave, N.P.      Cristy Hilts. Jacinto Halim, MD  Electronically Signed    BB/MEDQ  D:  01/23/2006  T:  01/23/2006  Job:  [id number]

## 2010-12-09 NOTE — Assessment & Plan Note (Signed)
HEALTHCARE                            CARDIOLOGY OFFICE NOTE   NAME:[name], Diana DOLECKI                        MRN:          [id number]  DATE:11/20/2006                            DOB:          April 26, 1966    [name] (daughter of [name]) is seen for cardiology  followup. I saw her last in November 2007. She has been stable. She did  have carotid Doppler followup today. I do not have that report available  yet but the preliminary is no significant change since the prior study.  We had done one at 6 months because we were concerned about the severity  of her initial finding, but they appear to be quite stable. She is not  having any chest pain. She had a difficult time with her schoolwork  along with regular work and unfortunately admits that her diet has been  not optimal and she has gained more weight. She is now motivated in that  she has a place to exercise at work and there is a weight loss program  and she is looking for  dietician help and we will support all of these  efforts. She is not having chest pain, syncope or presyncope.   PAST MEDICAL HISTORY:   ALLERGIES:  PENICILLIN.   MEDICATIONS:  Plavix, benazepril, metoprolol, simvastatin, aspirin and  Vytorin.   OTHER MEDICAL PROBLEMS:  See the extensive list on my note of April 19, 2006 and the note of June 01, 2006.   REVIEW OF SYSTEMS:  She has had some mild tingling in her arms which  does not seem to be significant. Otherwise her review of systems is  negative.   PHYSICAL EXAMINATION:  VITAL SIGNS:  Her weight is up to 216 pounds.  Blood pressure is 122/83 with a pulse of 82.  GENERAL:  The is oriented to person, time and place. Her affect is  normal.  HEENT:  Reveals no xanthelasma. She has normal extraocular motion. She  has no carotid bruits. There is no jugular venous distention.  LUNGS:  Clear. Respiratory effort is not labored.  CARDIAC:  Reveals an S1 with an  S2. There are no clicks or significant  murmurs.  ABDOMEN:  Soft. There are no masses or bruits. She has normal bowel  sounds. She has no peripheral edema.   EKG reveals no change.   PROBLEM LIST:  Problems are listed #1-10 of the note of June 01, 2006.  1. Coronary disease, stable.  2. Obesity. See the description above of how she plans to begin her      weight loss program.  3. Need for gynecological surgery. This is on a stable mode at this      time. I do not want to stop her Plavix at this time. She received a      CYPHER stent. This was done in May 2007. Over time now, she would      be able to come off her Plavix if needed.   I will see her for cardiology followup in 6 months.  Diana Abed, MD, [facility]  Electronically Signed    JDK/MedQ  DD: 11/20/2006  DT: 11/20/2006  Job #: [id number]   cc:   [name], M.D.  [name], M.D.

## 2010-12-09 NOTE — Cardiovascular Report (Signed)
[name], [name]                 ACCOUNT NO.:  [id number]   MEDICAL RECORD NO.:  [id number]          PATIENT TYPE:  INP   LOCATION:  2915                         FACILITY:  [facility]   PHYSICIAN:  Corky Crafts, MDDATE OF BIRTH:  1966-07-14   DATE OF PROCEDURE:  12/15/2005  DATE OF DISCHARGE:  12/15/2005                              CARDIAC CATHETERIZATION   REFERRING PHYSICIAN:  Dr. Kem Boroughs   PROCEDURES PERFORMED:  Left heart catheterization, coronary angiogram, left  ventriculogram, PCI of the right coronary artery.   OPERATOR:  Dr. Eldridge Dace   INDICATIONS:  Unstable angina, non-ST elevation MI.   PROCEDURAL NARRATIVE:  The patient was admitted to the hospital and  continued to have pain in her chest at rest.  She also had elevated cardiac  enzymes.  The risks and benefits of cardiac catheterization were explained  to the patient by Dr. Domingo Sep.  The patient was brought to the cardiac  catheterization lab and placed on the table.  She was prepped and draped in  the usual sterile fashion.  Her right groin was infiltrated with 1%  lidocaine.  A 6-French arterial sheath was placed into a right femoral  artery using the modified Seldinger technique.  Left coronary artery  angiography was performed using a JL-4.0 catheter.  Digital angiography was  performed in multiple projections using hand injection of contrast.  The  right coronary artery angiography was then performed using a JR-4.0  catheter.  Digital angiography was performed using hand injection of  contrast.  The PCI of the right coronary artery was then performed.  Please  see below for details.  After the PCI a pigtail catheter was advanced to the  ascending aorta and across the aortic valve under fluoroscopic guidance.  Hemodynamic pressure assessment was performed.  The ventriculogram was  performed in the 30 degree RAO position using power injection of contrast.  The catheter was then pulled back across the  aortic valve under continuous  hemodynamic pressure monitoring.  The right femoral sheath will be removed  using manual compression.   FINDINGS:  The left main artery was widely patent.  The left circumflex was  a medium-sized vessel with luminal irregularities.  The first obtuse  marginal was a large vessel with luminal irregularities.  The second obtuse  marginal was a small vessel.  The left anterior descending had minor  irregularities throughout.  There was a 70% stenosis in the mid vessel at  the origin of the second diagonal.  The first diagonal was small.  The  second diagonal was a medium-sized vessel without significant stenosis.  The  right coronary artery was occluded in the mid portion.  There was evidence  of left-to-right collaterals filling the distal vessel.  The left  ventriculogram showed mid to basal inferior hypokinesis of moderate degree.  The overall left ventricular ejection fraction was estimated to be 50%.   HEMODYNAMICS:  Left ventricular pressure 106/8 with a left ventricular end-  diastolic pressure of 21 mmHg.  Aortic pressure was 113/64 with a mean  aortic pressure of 84 mmHg.  PERCUTANEOUS CORONARY INTERVENTION NARRATIVE:  A JR-4 guiding catheter with  side holes was advanced to the ascending aorta and placed in the ostium of  the right coronary artery under fluoroscopic guidance.  An Public relations account executive was advanced across the lesion, some flow was restored just with the  wire, thrombus was visible.  An export catheter was then used to aspirate  thrombus.  There was successful removal of thrombus from the mid right  coronary artery.  A 2.5 x 18 mm Cypher stent was then advanced across the  lesion and inflated to 16 atmospheres for 38 seconds.  The midportion of the  stent was then postdilated with a 3.0 x 13 mm PowerSail inflated to 14  atmospheres for 24 seconds and then again 14 atmospheres for 8 seconds.  There was an excellent angiographic result,  there is no residual stenosis,  and there is TIMI 3 flow.  There were other mild irregularities noted in the  distal right coronary artery.   IMPRESSIONS:  1.  Acute myocardial infarction from occluded right coronary artery.  The      EKG did not change dramatically likely because of significant left-to-      right collaterals.  2.  Successful aspiration thrombectomy with subsequent drug eluting stent      placement with a 2.5 x 18 mm Cypher stent to the mid right coronary      artery.  This was postdilated to greater than 3 mm in diameter.  3.  Inferior hypokinesis with an overall estimated left ventricular ejection      fraction of 50%.   RECOMMENDATIONS:  The patient will receive Integrilin for 18 hours.  She  will receive a Plavix loading dose of 600 mg x1.  She should receive aspirin  325 mg p.o. daily and Plavix 75 mg p.o. daily indefinitely.  She will also  receive aggressive secondary prevention including blood pressure control and  lipid lowering therapy.  Further plans will be per Dr. Domingo Sep.      Corky Crafts, MD  Electronically Signed     JSV/MEDQ  D:  12/15/2005  T:  12/15/2005  Job:  [id number]

## 2010-12-16 ENCOUNTER — Telehealth: Payer: Self-pay | Admitting: Cardiology

## 2010-12-16 MED ORDER — CLOPIDOGREL BISULFATE 75 MG PO TABS: 75.0000 mg | ORAL_TABLET | Freq: Every day | ORAL | Status: DC

## 2010-12-16 NOTE — Telephone Encounter (Signed)
RX sent into pharmacy. Pt notified. 

## 2010-12-16 NOTE — Telephone Encounter (Signed)
Refill requested 5-21 pt now out needs refill asap for plavix 75 mg uses [facility]

## 2010-12-17 ENCOUNTER — Other Ambulatory Visit: Payer: Self-pay | Admitting: *Deleted

## 2010-12-17 MED ORDER — CLOPIDOGREL BISULFATE 75 MG PO TABS: 75.0000 mg | ORAL_TABLET | Freq: Every day | ORAL | Status: DC

## 2011-04-13 ENCOUNTER — Other Ambulatory Visit: Payer: Self-pay | Admitting: Cardiology

## 2011-04-13 ENCOUNTER — Encounter: Payer: Self-pay | Admitting: Cardiology

## 2011-04-13 DIAGNOSIS — Z720 Tobacco use: Secondary | ICD-10-CM | POA: Insufficient documentation

## 2011-04-13 DIAGNOSIS — I739 Peripheral vascular disease, unspecified: Secondary | ICD-10-CM

## 2011-04-13 DIAGNOSIS — E785 Hyperlipidemia, unspecified: Secondary | ICD-10-CM | POA: Insufficient documentation

## 2011-04-13 DIAGNOSIS — I6529 Occlusion and stenosis of unspecified carotid artery: Secondary | ICD-10-CM

## 2011-04-13 DIAGNOSIS — E663 Overweight: Secondary | ICD-10-CM | POA: Insufficient documentation

## 2011-04-13 DIAGNOSIS — R9430 Abnormal result of cardiovascular function study, unspecified: Secondary | ICD-10-CM | POA: Insufficient documentation

## 2011-04-13 DIAGNOSIS — Z90710 Acquired absence of both cervix and uterus: Secondary | ICD-10-CM | POA: Insufficient documentation

## 2011-04-13 DIAGNOSIS — I2510 Atherosclerotic heart disease of native coronary artery without angina pectoris: Secondary | ICD-10-CM | POA: Insufficient documentation

## 2011-04-13 DIAGNOSIS — F419 Anxiety disorder, unspecified: Secondary | ICD-10-CM | POA: Insufficient documentation

## 2011-04-13 DIAGNOSIS — I779 Disorder of arteries and arterioles, unspecified: Secondary | ICD-10-CM | POA: Insufficient documentation

## 2011-04-14 ENCOUNTER — Encounter: Payer: Self-pay | Admitting: Cardiology

## 2011-04-14 ENCOUNTER — Ambulatory Visit (INDEPENDENT_AMBULATORY_CARE_PROVIDER_SITE_OTHER): Payer: 59 | Admitting: Cardiology

## 2011-04-14 ENCOUNTER — Encounter (INDEPENDENT_AMBULATORY_CARE_PROVIDER_SITE_OTHER): Payer: 59 | Admitting: *Deleted

## 2011-04-14 DIAGNOSIS — I779 Disorder of arteries and arterioles, unspecified: Secondary | ICD-10-CM

## 2011-04-14 DIAGNOSIS — I6529 Occlusion and stenosis of unspecified carotid artery: Secondary | ICD-10-CM

## 2011-04-14 DIAGNOSIS — E079 Disorder of thyroid, unspecified: Secondary | ICD-10-CM

## 2011-04-14 DIAGNOSIS — I2510 Atherosclerotic heart disease of native coronary artery without angina pectoris: Secondary | ICD-10-CM

## 2011-04-14 NOTE — Assessment & Plan Note (Signed)
I. The patient had carotid Doppler today.  There was question of an abnormality of the left lobe of thyroid.  She will speak with her primary physician about this.

## 2011-04-14 NOTE — Progress Notes (Signed)
HPI Patient is seen today for followup coronary disease.  She has not had significant chest pain.  Did not have any significant shortness of breath.  She feels fatigued and felt that her cognitive abilities and decreased briefly this week.  She's not had any focal deficits. Allergies  Allergen Reactions  . Penicillins     Current Outpatient Prescriptions  Medication Sig Dispense Refill  . ALPRAZolam (XANAX) 0.5 MG tablet Take 1 tablet by mouth Twice daily.      . Ascorbic Acid (VITAMIN C PO) Take by mouth daily.        . benazepril (LOTENSIN) 5 MG tablet Take 1 tablet by mouth Daily.      . clopidogrel (PLAVIX) 75 MG tablet Take 1 tablet (75 mg total) by mouth daily.  90 tablet  2  . ezetimibe-simvastatin (VYTORIN) 10-40 MG per tablet Take 1 tablet by mouth at bedtime.  90 tablet  3  . metFORMIN (GLUCOPHAGE) 1000 MG tablet Take 1 tablet by mouth Daily.      . metoprolol tartrate (LOPRESSOR) 25 MG tablet Take 1 tablet by mouth Daily.        History   Social History  . Marital Status: Married    Spouse Name: N/A    Number of Children: N/A  . Years of Education: N/A   Occupational History  . Not on file.   Social History Main Topics  . Smoking status: Current Everyday Smoker    Types: Cigarettes  . Smokeless tobacco: Not on file   Comment: 10-15 cigarettes daily  . Alcohol Use: Not on file  . Drug Use: Not on file  . Sexually Active: Not on file   Other Topics Concern  . Not on file   Social History Narrative  . No narrative on file    No family history on file.  Past Medical History  Diagnosis Date  . CAD (coronary artery disease)     DES RCA for MI,11/2005 /  nuclear 10/2008 , 53%, no scar or ischemia  . Ejection fraction     55% cath 2007  /  53% nuclear, 10/2008, inferior hypo  . Carotid artery disease   . Overweight   . Tobacco abuse   . Dyslipidemia   . S/P hysterectomy     Very large fibroids.  . Anxiety   . Thyroid disorder     Left lobe of thyroid as  abnormal appearance noted on carotid Doppler September 21,    No past surgical history on file.  ROS  Patient denies fever, chills, headache, sweats, rash, change in vision, change in hearing, chest pain, cough, nausea vomiting, urinary symptoms.  All of the systems are reviewed and are negative.  PHYSICAL EXAM Patient is stable.  She is oriented to person time and place.  Affect is normal.  Head is atraumatic.  No jugular venous distention.  Lungs are clear.  Respiratory effort is nonlabored.  Cardiac exam reveals S1 and S2.  No clicks or significant murmurs.  Abdomen is soft.  There is no peripheral edema. Filed Vitals:   04/14/11 1456  BP: 130/80  Pulse: 78  Height: 5\' 6"  (1.676 m)  Weight: 205 lb (92.987 kg)     EKG is not done today.  ASSESSMENT & PLAN

## 2011-04-14 NOTE — Assessment & Plan Note (Addendum)
The patient has significant carotid artery disease.  Today's Doppler shows it is stable.  It is of note that on the Doppler there seems to be an abnormality of the left lobe of thyroid.  The patient knows to talk with her primary physician about this.

## 2011-04-14 NOTE — Patient Instructions (Signed)
Your physician recommends that you schedule a follow-up appointment in: 6 MONTHS WITH DR Myrtis Ser AND CAROTID SAME DAY  Your physician recommends that you continue on your current medications as directed. Please refer to the Current Medication list given to you today.  Your physician has requested that you have a carotid duplex. This test is an ultrasound of the carotid arteries in your neck. It looks at blood flow through these arteries that supply the brain with blood. Allow one hour for this exam. There are no restrictions or special instructions. 6 MONTHS  SEE DR Myrtis Ser SAME DAY  DX [phone]

## 2011-04-14 NOTE — Assessment & Plan Note (Signed)
Coronary disease is stable. No change in therapy. 

## 2011-04-21 ENCOUNTER — Encounter: Payer: 59 | Admitting: *Deleted

## 2011-04-21 ENCOUNTER — Ambulatory Visit: Payer: 59 | Admitting: Cardiology

## 2011-05-29 ENCOUNTER — Other Ambulatory Visit (HOSPITAL_COMMUNITY): Payer: Self-pay | Admitting: Internal Medicine

## 2011-05-29 DIAGNOSIS — R945 Abnormal results of liver function studies: Secondary | ICD-10-CM

## 2011-06-01 ENCOUNTER — Ambulatory Visit (HOSPITAL_COMMUNITY): Payer: 59

## 2011-06-07 ENCOUNTER — Ambulatory Visit (HOSPITAL_COMMUNITY)
Admission: RE | Admit: 2011-06-07 | Discharge: 2011-06-07 | Disposition: A | Discharge status: Home / Self Care | Payer: 59 | Source: Ambulatory Visit | Attending: Internal Medicine | Admitting: Internal Medicine

## 2011-06-07 DIAGNOSIS — R748 Abnormal levels of other serum enzymes: Secondary | ICD-10-CM | POA: Insufficient documentation

## 2011-06-07 DIAGNOSIS — R945 Abnormal results of liver function studies: Secondary | ICD-10-CM

## 2011-06-07 DIAGNOSIS — R932 Abnormal findings on diagnostic imaging of liver and biliary tract: Secondary | ICD-10-CM | POA: Insufficient documentation

## 2011-06-30 ENCOUNTER — Emergency Department (HOSPITAL_COMMUNITY): Payer: No Typology Code available for payment source

## 2011-06-30 ENCOUNTER — Encounter (HOSPITAL_COMMUNITY): Payer: Self-pay

## 2011-06-30 ENCOUNTER — Inpatient Hospital Stay (HOSPITAL_COMMUNITY)
Admission: EM | Admit: 2011-06-30 | Discharge: 2011-07-24 | DRG: 957 | Disposition: A | Discharge status: Skilled Nursing Facility | Payer: No Typology Code available for payment source | Attending: General Surgery | Admitting: General Surgery

## 2011-06-30 ENCOUNTER — Other Ambulatory Visit: Payer: Self-pay

## 2011-06-30 DIAGNOSIS — E669 Obesity, unspecified: Secondary | ICD-10-CM | POA: Diagnosis present

## 2011-06-30 DIAGNOSIS — S82202A Unspecified fracture of shaft of left tibia, initial encounter for closed fracture: Secondary | ICD-10-CM | POA: Diagnosis present

## 2011-06-30 DIAGNOSIS — S62109A Fracture of unspecified carpal bone, unspecified wrist, initial encounter for closed fracture: Secondary | ICD-10-CM

## 2011-06-30 DIAGNOSIS — F411 Generalized anxiety disorder: Secondary | ICD-10-CM | POA: Diagnosis present

## 2011-06-30 DIAGNOSIS — S62307A Unspecified fracture of fifth metacarpal bone, left hand, initial encounter for closed fracture: Secondary | ICD-10-CM | POA: Diagnosis present

## 2011-06-30 DIAGNOSIS — S32409A Unspecified fracture of unspecified acetabulum, initial encounter for closed fracture: Principal | ICD-10-CM | POA: Diagnosis present

## 2011-06-30 DIAGNOSIS — I10 Essential (primary) hypertension: Secondary | ICD-10-CM | POA: Diagnosis present

## 2011-06-30 DIAGNOSIS — F17200 Nicotine dependence, unspecified, uncomplicated: Secondary | ICD-10-CM | POA: Diagnosis present

## 2011-06-30 DIAGNOSIS — E119 Type 2 diabetes mellitus without complications: Secondary | ICD-10-CM | POA: Diagnosis present

## 2011-06-30 DIAGNOSIS — Y92410 Unspecified street and highway as the place of occurrence of the external cause: Secondary | ICD-10-CM

## 2011-06-30 DIAGNOSIS — L89609 Pressure ulcer of unspecified heel, unspecified stage: Secondary | ICD-10-CM | POA: Diagnosis not present

## 2011-06-30 DIAGNOSIS — S8253XA Displaced fracture of medial malleolus of unspecified tibia, initial encounter for closed fracture: Secondary | ICD-10-CM | POA: Diagnosis present

## 2011-06-30 DIAGNOSIS — S27329A Contusion of lung, unspecified, initial encounter: Secondary | ICD-10-CM

## 2011-06-30 DIAGNOSIS — S62319A Displaced fracture of base of unspecified metacarpal bone, initial encounter for closed fracture: Secondary | ICD-10-CM | POA: Diagnosis present

## 2011-06-30 DIAGNOSIS — D62 Acute posthemorrhagic anemia: Secondary | ICD-10-CM | POA: Diagnosis present

## 2011-06-30 DIAGNOSIS — S066X9A Traumatic subarachnoid hemorrhage with loss of consciousness of unspecified duration, initial encounter: Secondary | ICD-10-CM

## 2011-06-30 DIAGNOSIS — L89509 Pressure ulcer of unspecified ankle, unspecified stage: Secondary | ICD-10-CM | POA: Diagnosis not present

## 2011-06-30 DIAGNOSIS — S12100A Unspecified displaced fracture of second cervical vertebra, initial encounter for closed fracture: Secondary | ICD-10-CM

## 2011-06-30 DIAGNOSIS — S82201A Unspecified fracture of shaft of right tibia, initial encounter for closed fracture: Secondary | ICD-10-CM

## 2011-06-30 DIAGNOSIS — J95821 Acute postprocedural respiratory failure: Secondary | ICD-10-CM | POA: Diagnosis not present

## 2011-06-30 DIAGNOSIS — I6529 Occlusion and stenosis of unspecified carotid artery: Secondary | ICD-10-CM | POA: Diagnosis present

## 2011-06-30 DIAGNOSIS — S0190XA Unspecified open wound of unspecified part of head, initial encounter: Secondary | ICD-10-CM | POA: Diagnosis present

## 2011-06-30 DIAGNOSIS — L8990 Pressure ulcer of unspecified site, unspecified stage: Secondary | ICD-10-CM | POA: Diagnosis not present

## 2011-06-30 DIAGNOSIS — S73005A Unspecified dislocation of left hip, initial encounter: Secondary | ICD-10-CM

## 2011-06-30 DIAGNOSIS — S82409A Unspecified fracture of shaft of unspecified fibula, initial encounter for closed fracture: Secondary | ICD-10-CM | POA: Diagnosis present

## 2011-06-30 DIAGNOSIS — S2242XA Multiple fractures of ribs, left side, initial encounter for closed fracture: Secondary | ICD-10-CM | POA: Diagnosis present

## 2011-06-30 DIAGNOSIS — R1012 Left upper quadrant pain: Secondary | ICD-10-CM | POA: Diagnosis present

## 2011-06-30 DIAGNOSIS — IMO0002 Reserved for concepts with insufficient information to code with codable children: Secondary | ICD-10-CM | POA: Diagnosis present

## 2011-06-30 DIAGNOSIS — E079 Disorder of thyroid, unspecified: Secondary | ICD-10-CM | POA: Diagnosis present

## 2011-06-30 DIAGNOSIS — S82209A Unspecified fracture of shaft of unspecified tibia, initial encounter for closed fracture: Secondary | ICD-10-CM | POA: Diagnosis present

## 2011-06-30 DIAGNOSIS — I498 Other specified cardiac arrhythmias: Secondary | ICD-10-CM | POA: Diagnosis not present

## 2011-06-30 DIAGNOSIS — Y998 Other external cause status: Secondary | ICD-10-CM

## 2011-06-30 DIAGNOSIS — I609 Nontraumatic subarachnoid hemorrhage, unspecified: Secondary | ICD-10-CM

## 2011-06-30 DIAGNOSIS — E785 Hyperlipidemia, unspecified: Secondary | ICD-10-CM | POA: Diagnosis present

## 2011-06-30 DIAGNOSIS — J9589 Other postprocedural complications and disorders of respiratory system, not elsewhere classified: Secondary | ICD-10-CM | POA: Diagnosis not present

## 2011-06-30 DIAGNOSIS — S62309A Unspecified fracture of unspecified metacarpal bone, initial encounter for closed fracture: Secondary | ICD-10-CM

## 2011-06-30 DIAGNOSIS — S32402A Unspecified fracture of left acetabulum, initial encounter for closed fracture: Secondary | ICD-10-CM

## 2011-06-30 DIAGNOSIS — G5730 Lesion of lateral popliteal nerve, unspecified lower limb: Secondary | ICD-10-CM | POA: Diagnosis not present

## 2011-06-30 DIAGNOSIS — I2510 Atherosclerotic heart disease of native coronary artery without angina pectoris: Secondary | ICD-10-CM | POA: Diagnosis present

## 2011-06-30 DIAGNOSIS — S2249XA Multiple fractures of ribs, unspecified side, initial encounter for closed fracture: Secondary | ICD-10-CM | POA: Diagnosis present

## 2011-06-30 DIAGNOSIS — S2232XA Fracture of one rib, left side, initial encounter for closed fracture: Secondary | ICD-10-CM

## 2011-06-30 DIAGNOSIS — L0591 Pilonidal cyst without abscess: Secondary | ICD-10-CM | POA: Diagnosis not present

## 2011-06-30 LAB — POCT I-STAT, CHEM 8
BUN: 9 mg/dL (ref 6–23)
Chloride: 104 meq/L (ref 96–112)
Creatinine, Ser: 0.6 mg/dL (ref 0.50–1.10)
Potassium: 3.7 meq/L (ref 3.5–5.1)
Sodium: 139 meq/L (ref 135–145)
TCO2: 21 mmol/L (ref 0–100)

## 2011-06-30 LAB — COMPREHENSIVE METABOLIC PANEL
AST: 223 U/L — ABNORMAL HIGH (ref 0–37)
Albumin: 3.5 g/dL (ref 3.5–5.2)
BUN: 10 mg/dL (ref 6–23)
Chloride: 103 mEq/L (ref 96–112)
Creatinine, Ser: 0.52 mg/dL (ref 0.50–1.10)
Potassium: 3.7 mEq/L (ref 3.5–5.1)
Total Bilirubin: 0.2 mg/dL — ABNORMAL LOW (ref 0.3–1.2)
Total Protein: 6.1 g/dL (ref 6.0–8.3)

## 2011-06-30 LAB — CBC
HCT: 36.2 % (ref 36.0–46.0)
MCH: 30 pg (ref 26.0–34.0)
MCV: 92.1 fL (ref 78.0–100.0)
Platelets: 328 10*3/uL (ref 150–400)
RBC: 3.93 MIL/uL (ref 3.87–5.11)
WBC: 29.1 10*3/uL — ABNORMAL HIGH (ref 4.0–10.5)

## 2011-06-30 LAB — PROTIME-INR: Prothrombin Time: 13.6 seconds (ref 11.6–15.2)

## 2011-06-30 LAB — ABO/RH: ABO/RH(D): A POS

## 2011-06-30 LAB — LACTIC ACID, PLASMA: Lactic Acid, Venous: 2.8 mmol/L — ABNORMAL HIGH (ref 0.5–2.2)

## 2011-06-30 MED ORDER — ETOMIDATE 2 MG/ML IV SOLN: 14.0000 mg | Freq: Once | INTRAVENOUS | Status: AC

## 2011-06-30 MED ORDER — ETOMIDATE 2 MG/ML IV SOLN
INTRAVENOUS | Status: AC
  Filled 2011-06-30: qty 20

## 2011-06-30 MED ORDER — FENTANYL CITRATE 0.05 MG/ML IJ SOLN
25.0000 ug | Freq: Once | INTRAMUSCULAR | Status: AC
  Administered 2011-06-30: 50 ug via INTRAVENOUS
  Filled 2011-06-30: qty 2

## 2011-06-30 MED ORDER — IOHEXOL 300 MG/ML  SOLN
100.0000 mL | Freq: Once | INTRAMUSCULAR | Status: AC | PRN
  Administered 2011-06-30: 100 mL via INTRAVENOUS

## 2011-06-30 MED ORDER — ETOMIDATE 2 MG/ML IV SOLN
INTRAVENOUS | Status: AC | PRN
  Administered 2011-06-30 (×2): 14 mg via INTRAVENOUS

## 2011-06-30 MED ORDER — SODIUM CHLORIDE 0.9 % IV SOLN
INTRAVENOUS | Status: AC | PRN
  Administered 2011-06-30: 999 mL/h via INTRAVENOUS

## 2011-06-30 NOTE — ED Notes (Signed)
Per EMS, pt combative at scene d/t confusion. GCS 14. Pt repeating questions i.e.  "why am I going to the hospital", "where is my husband?, "what happened?" . . . On EMS mini memory exam pt unable to remember 3 objects (dog, eraser, cat).. However pt could tell health hx, meds & allergies.

## 2011-06-30 NOTE — ED Notes (Signed)
Surgery MD @ bedside evaluating and speaking with patient/family.

## 2011-06-30 NOTE — ED Provider Notes (Signed)
History   HPI            Review of Systems      Physical Exam  ED Course  Procedural sedation Date/Time: 06/30/2011 10:45 PM Performed by: Hanley Seamen Authorized by: Hanley Seamen Consent: Verbal consent obtained. Written consent obtained. Risks and benefits: risks, benefits and alternatives were discussed Consent given by: patient and spouse Imaging studies: imaging studies available Patient identity confirmed: verbally with patient and arm band Time out: Immediately prior to procedure a "time out" was called to verify the correct patient, procedure, equipment, support staff and site/side marked as required. Comments: Patient sedated with 70 mg of etomidate rate. Dr. Magnus Ivan of orthopedics attempted to reduce the fracture dislocation of the left hip. This was unsuccessful and the etomidate was repeated. Dr. Magnus Ivan was able to reduce the hip the second time. Hip placement was verified with x-ray. Total time spent with patient 35 minutes.                Hanley Seamen, MD 06/30/11 [phone]

## 2011-06-30 NOTE — ED Notes (Signed)
Patient is resting comfortably. 
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
## 2011-06-30 NOTE — ED Notes (Signed)
Phoned radiology for a portable 1-view left hip xray for post reduction
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
## 2011-06-30 NOTE — ED Notes (Signed)
Received I-stat results and showed to Dr. Carolynne Edouard

## 2011-06-30 NOTE — ED Notes (Signed)
In xray with patient. Pt also c/o left knee pain. Phoned Dr. Griffith Citron about this new complaint. Order received for left knee & hip xray.
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
## 2011-06-30 NOTE — ED Notes (Signed)
Returned from xray

## 2011-07-01 ENCOUNTER — Inpatient Hospital Stay (HOSPITAL_COMMUNITY): Payer: No Typology Code available for payment source

## 2011-07-01 ENCOUNTER — Encounter (HOSPITAL_COMMUNITY): Payer: Self-pay | Admitting: Neurology

## 2011-07-01 DIAGNOSIS — S62307A Unspecified fracture of fifth metacarpal bone, left hand, initial encounter for closed fracture: Secondary | ICD-10-CM | POA: Diagnosis present

## 2011-07-01 DIAGNOSIS — S82209A Unspecified fracture of shaft of unspecified tibia, initial encounter for closed fracture: Secondary | ICD-10-CM

## 2011-07-01 DIAGNOSIS — S32402A Unspecified fracture of left acetabulum, initial encounter for closed fracture: Secondary | ICD-10-CM | POA: Diagnosis present

## 2011-07-01 DIAGNOSIS — S27329A Contusion of lung, unspecified, initial encounter: Secondary | ICD-10-CM | POA: Diagnosis present

## 2011-07-01 DIAGNOSIS — S82201A Unspecified fracture of shaft of right tibia, initial encounter for closed fracture: Secondary | ICD-10-CM | POA: Diagnosis present

## 2011-07-01 DIAGNOSIS — S2242XA Multiple fractures of ribs, left side, initial encounter for closed fracture: Secondary | ICD-10-CM | POA: Diagnosis present

## 2011-07-01 DIAGNOSIS — S82202A Unspecified fracture of shaft of left tibia, initial encounter for closed fracture: Secondary | ICD-10-CM | POA: Diagnosis present

## 2011-07-01 DIAGNOSIS — S73005A Unspecified dislocation of left hip, initial encounter: Secondary | ICD-10-CM | POA: Diagnosis present

## 2011-07-01 DIAGNOSIS — S82409A Unspecified fracture of shaft of unspecified fibula, initial encounter for closed fracture: Secondary | ICD-10-CM

## 2011-07-01 DIAGNOSIS — S12100A Unspecified displaced fracture of second cervical vertebra, initial encounter for closed fracture: Secondary | ICD-10-CM | POA: Diagnosis present

## 2011-07-01 DIAGNOSIS — I609 Nontraumatic subarachnoid hemorrhage, unspecified: Secondary | ICD-10-CM | POA: Diagnosis present

## 2011-07-01 LAB — URINALYSIS, MICROSCOPIC ONLY
Bilirubin Urine: NEGATIVE
Glucose, UA: NEGATIVE mg/dL
Ketones, ur: NEGATIVE mg/dL
Nitrite: NEGATIVE
Specific Gravity, Urine: 1.042 — ABNORMAL HIGH (ref 1.005–1.030)
pH: 5.5 (ref 5.0–8.0)

## 2011-07-01 MED ORDER — ONDANSETRON HCL 4 MG PO TABS: 4.0000 mg | ORAL_TABLET | Freq: Four times a day (QID) | ORAL | Status: DC | PRN

## 2011-07-01 MED ORDER — ONDANSETRON HCL 4 MG/2ML IJ SOLN
4.0000 mg | Freq: Four times a day (QID) | INTRAMUSCULAR | Status: DC | PRN
  Administered 2011-07-01: 4 mg via INTRAVENOUS
  Filled 2011-07-01: qty 2

## 2011-07-01 MED ORDER — ONDANSETRON HCL 4 MG/2ML IJ SOLN: 4.0000 mg | Freq: Four times a day (QID) | INTRAMUSCULAR | Status: DC | PRN

## 2011-07-01 MED ORDER — MORPHINE SULFATE 4 MG/ML IJ SOLN
4.0000 mg | INTRAMUSCULAR | Status: DC | PRN
  Administered 2011-07-01 – 2011-07-02 (×6): 4 mg via INTRAVENOUS
  Filled 2011-07-01 (×3): qty 1
  Filled 2011-07-01 (×2): qty 2
  Filled 2011-07-01 (×2): qty 1
  Filled 2011-07-01: qty 2

## 2011-07-01 MED ORDER — DIPHENHYDRAMINE HCL 50 MG/ML IJ SOLN: 12.5000 mg | Freq: Four times a day (QID) | INTRAMUSCULAR | Status: DC | PRN

## 2011-07-01 MED ORDER — POTASSIUM CHLORIDE IN NACL 20-0.9 MEQ/L-% IV SOLN
INTRAVENOUS | Status: DC
  Administered 2011-07-01 – 2011-07-16 (×21): via INTRAVENOUS
  Filled 2011-07-01 (×34): qty 1000

## 2011-07-01 MED ORDER — MORPHINE SULFATE (PF) 1 MG/ML IV SOLN
INTRAVENOUS | Status: DC
  Administered 2011-07-01: 21:00:00 via INTRAVENOUS
  Administered 2011-07-01: 3 mg via INTRAVENOUS
  Administered 2011-07-01: 15:00:00 via INTRAVENOUS
  Filled 2011-07-01 (×2): qty 25

## 2011-07-01 MED ORDER — DIPHENHYDRAMINE HCL 12.5 MG/5ML PO ELIX
12.5000 mg | ORAL_SOLUTION | Freq: Four times a day (QID) | ORAL | Status: DC | PRN
  Filled 2011-07-01: qty 5

## 2011-07-01 MED ORDER — PANTOPRAZOLE SODIUM 40 MG IV SOLR
40.0000 mg | Freq: Every day | INTRAVENOUS | Status: DC
  Administered 2011-07-01 – 2011-07-04 (×4): 40 mg via INTRAVENOUS
  Filled 2011-07-01 (×5): qty 40

## 2011-07-01 MED ORDER — SODIUM CHLORIDE 0.9 % IJ SOLN: 9.0000 mL | INTRAMUSCULAR | Status: DC | PRN

## 2011-07-01 MED ORDER — METOPROLOL TARTRATE 1 MG/ML IV SOLN
INTRAVENOUS | Status: AC
  Administered 2011-07-01: 5 mg via INTRAVENOUS
  Filled 2011-07-01: qty 5

## 2011-07-01 MED ORDER — WHITE PETROLATUM GEL
Status: AC
  Filled 2011-07-01: qty 5

## 2011-07-01 MED ORDER — NALOXONE HCL 0.4 MG/ML IJ SOLN: 0.4000 mg | INTRAMUSCULAR | Status: DC | PRN

## 2011-07-01 MED ORDER — PANTOPRAZOLE SODIUM 40 MG PO TBEC: 40.0000 mg | DELAYED_RELEASE_TABLET | Freq: Every day | ORAL | Status: DC

## 2011-07-01 MED ORDER — METOPROLOL TARTRATE 1 MG/ML IV SOLN
5.0000 mg | Freq: Four times a day (QID) | INTRAVENOUS | Status: DC
  Administered 2011-07-01 – 2011-07-06 (×8): 5 mg via INTRAVENOUS
  Filled 2011-07-01 (×21): qty 5

## 2011-07-01 NOTE — Progress Notes (Signed)
Patient ID: [name], female   DOB: [birth year], 45 y.o.   MRN: [id number] I talked with the patient and family in length at the beside.  She is getting ready to have a follow-up CT of her head.  I have ordered CTs of her left hip/pelvis, left knee, and right ankle.  She does need fixation of her right tib/fib fracture which we may proceed with today vs tomorrow based on the head CT and her stability.
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
## 2011-07-01 NOTE — Progress Notes (Addendum)
Patient ID: [name], female   DOB: 08/09/1965, 45 y.o.   MRN: [id number] I reviewed all of her extremity CT scans and went over these with her family.  Will proceed to the OR in the am for fixation of her right tib/fib fracture.
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
## 2011-07-01 NOTE — ED Notes (Signed)
Patient is resting comfortably. 

## 2011-07-01 NOTE — ED Notes (Signed)
Written consent obtained and signed by spouse at bedside prior to procedure; witnessed by RN
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
## 2011-07-02 ENCOUNTER — Inpatient Hospital Stay (HOSPITAL_COMMUNITY): Payer: No Typology Code available for payment source

## 2011-07-02 ENCOUNTER — Inpatient Hospital Stay (HOSPITAL_COMMUNITY): Payer: No Typology Code available for payment source | Admitting: Anesthesiology

## 2011-07-02 ENCOUNTER — Encounter (HOSPITAL_COMMUNITY): Payer: Self-pay | Admitting: Anesthesiology

## 2011-07-02 ENCOUNTER — Encounter (HOSPITAL_COMMUNITY): Admission: EM | Disposition: A | Discharge status: Skilled Nursing Facility | Payer: Self-pay | Source: Home / Self Care

## 2011-07-02 DIAGNOSIS — J9600 Acute respiratory failure, unspecified whether with hypoxia or hypercapnia: Secondary | ICD-10-CM

## 2011-07-02 HISTORY — PX: TIBIA IM NAIL INSERTION: SHX2516

## 2011-07-02 LAB — BASIC METABOLIC PANEL
BUN: 6 mg/dL (ref 6–23)
Calcium: 7.1 mg/dL — ABNORMAL LOW (ref 8.4–10.5)
GFR calc Af Amer: >90 mL/min (ref >90)
GFR calc non Af Amer: >90 mL/min (ref >90)
Glucose, Bld: 171 mg/dL — ABNORMAL HIGH (ref 70–99)
Sodium: 139 mEq/L (ref 135–145)

## 2011-07-02 LAB — CBC
Hemoglobin: 8.2 g/dL — ABNORMAL LOW (ref 12.0–15.0)
MCH: 29.7 pg (ref 26.0–34.0)
MCHC: 32.3 g/dL (ref 30.0–36.0)
RDW: 13.3 % (ref 11.5–15.5)

## 2011-07-02 LAB — PREPARE RBC (CROSSMATCH)

## 2011-07-02 SURGERY — INSERTION, INTRAMEDULLARY ROD, TIBIA
Anesthesia: General | Laterality: Right | Wound class: Clean

## 2011-07-02 MED ORDER — VITAMIN E 15 UNIT/0.3ML PO SOLN
400.0000 [IU] | Freq: Three times a day (TID) | ORAL | Status: DC
  Filled 2011-07-02 (×3): qty 8

## 2011-07-02 MED ORDER — CLINDAMYCIN PHOSPHATE 600 MG/50ML IV SOLN
600.0000 mg | Freq: Three times a day (TID) | INTRAVENOUS | Status: AC
  Administered 2011-07-02 – 2011-07-03 (×3): 600 mg via INTRAVENOUS
  Filled 2011-07-02 (×3): qty 50

## 2011-07-02 MED ORDER — FENTANYL CITRATE 0.05 MG/ML IJ SOLN
INTRAMUSCULAR | Status: DC | PRN
  Administered 2011-07-02: 250 ug via INTRAVENOUS
  Administered 2011-07-02: 50 ug via INTRAVENOUS
  Administered 2011-07-02: 100 ug via INTRAVENOUS
  Administered 2011-07-02: 50 ug via INTRAVENOUS
  Administered 2011-07-02: 100 ug via INTRAVENOUS
  Administered 2011-07-02: 150 ug via INTRAVENOUS
  Administered 2011-07-02: 50 ug via INTRAVENOUS

## 2011-07-02 MED ORDER — VITAMIN C 500 MG PO TABS
1000.0000 mg | ORAL_TABLET | Freq: Three times a day (TID) | ORAL | Status: AC
  Administered 2011-07-02 – 2011-07-09 (×20): 1000 mg
  Filled 2011-07-02 (×21): qty 2

## 2011-07-02 MED ORDER — ROCURONIUM BROMIDE 100 MG/10ML IV SOLN
INTRAVENOUS | Status: DC | PRN
  Administered 2011-07-02 (×2): 50 mg via INTRAVENOUS

## 2011-07-02 MED ORDER — ACETAMINOPHEN 650 MG RE SUPP: 650.0000 mg | Freq: Four times a day (QID) | RECTAL | Status: DC | PRN

## 2011-07-02 MED ORDER — SELENIUM 50 MCG PO TABS
200.0000 ug | ORAL_TABLET | Freq: Every day | ORAL | Status: AC
  Administered 2011-07-02 – 2011-07-08 (×6): 200 ug
  Filled 2011-07-02 (×8): qty 4

## 2011-07-02 MED ORDER — SODIUM CHLORIDE 0.9 % IV SOLN
INTRAVENOUS | Status: DC | PRN
  Administered 2011-07-02: 08:00:00 via INTRAVENOUS

## 2011-07-02 MED ORDER — SODIUM CHLORIDE 0.9 % IV SOLN
50.0000 ug/h | INTRAVENOUS | Status: DC
  Administered 2011-07-02 – 2011-07-05 (×2): 75 ug/h via INTRAVENOUS
  Administered 2011-07-06: 50 ug/h via INTRAVENOUS
  Administered 2011-07-07 – 2011-07-08 (×2): 100 ug/h via INTRAVENOUS
  Administered 2011-07-09 – 2011-07-11 (×2): 75 ug/h via INTRAVENOUS
  Administered 2011-07-12 – 2011-07-14 (×3): 100 ug/h via INTRAVENOUS
  Administered 2011-07-15: 50 ug/h via INTRAVENOUS
  Filled 2011-07-02 (×12): qty 50

## 2011-07-02 MED ORDER — CLINDAMYCIN PHOSPHATE 600 MG/50ML IV SOLN
600.0000 mg | INTRAVENOUS | Status: AC
  Administered 2011-07-02: 600 mg via INTRAVENOUS
  Filled 2011-07-02: qty 50

## 2011-07-02 MED ORDER — MORPHINE SULFATE 10 MG/ML IJ SOLN
INTRAMUSCULAR | Status: DC | PRN
  Administered 2011-07-02 (×2): 5 mg via INTRAVENOUS

## 2011-07-02 MED ORDER — FENTANYL BOLUS VIA INFUSION
50.0000 ug | Freq: Four times a day (QID) | INTRAVENOUS | Status: DC | PRN
  Administered 2011-07-02: 100 ug/h via INTRAVENOUS
  Filled 2011-07-02: qty 100

## 2011-07-02 MED ORDER — JEVITY 1.2 CAL PO LIQD
1000.0000 mL | ORAL | Status: DC
  Administered 2011-07-02: 1000 mL
  Filled 2011-07-02 (×3): qty 1000

## 2011-07-02 MED ORDER — SODIUM CHLORIDE 0.9 % IV SOLN
2.0000 mg/h | INTRAVENOUS | Status: DC
  Administered 2011-07-02: 2 mg/h via INTRAVENOUS
  Administered 2011-07-02: 3 mg/h via INTRAVENOUS
  Administered 2011-07-03 – 2011-07-04 (×3): 2 mg/h via INTRAVENOUS
  Administered 2011-07-05: 3 mg/h via INTRAVENOUS
  Administered 2011-07-06: 1.5 mg/h via INTRAVENOUS
  Administered 2011-07-07: 1 mg/h via INTRAVENOUS
  Administered 2011-07-08: 2 mg/h via INTRAVENOUS
  Administered 2011-07-08: 3 mg/h via INTRAVENOUS
  Administered 2011-07-09 – 2011-07-10 (×3): 2 mg/h via INTRAVENOUS
  Administered 2011-07-11: 6 mg/h via INTRAVENOUS
  Administered 2011-07-11: 4 mg/h via INTRAVENOUS
  Administered 2011-07-12 – 2011-07-14 (×5): 6 mg/h via INTRAVENOUS
  Filled 2011-07-02 (×21): qty 10

## 2011-07-02 MED ORDER — ACETAMINOPHEN 160 MG/5ML PO SOLN
650.0000 mg | Freq: Four times a day (QID) | ORAL | Status: DC | PRN
  Administered 2011-07-02 – 2011-07-05 (×9): 650 mg
  Filled 2011-07-02 (×10): qty 20.3

## 2011-07-02 MED ORDER — MIDAZOLAM HCL 5 MG/5ML IJ SOLN
INTRAMUSCULAR | Status: DC | PRN
  Administered 2011-07-02 (×2): 2 mg via INTRAVENOUS

## 2011-07-02 MED ORDER — VITAMIN E 100 UNT/0.25ML PO OIL
400.0000 [IU] | TOPICAL_OIL | Freq: Three times a day (TID) | ORAL | Status: DC
  Administered 2011-07-02 – 2011-07-17 (×39): 400 [IU]
  Filled 2011-07-02 (×47): qty 1

## 2011-07-02 MED ORDER — MIDAZOLAM BOLUS VIA INFUSION
1.0000 mg | INTRAVENOUS | Status: DC | PRN
  Administered 2011-07-06: 1.5 mg via INTRAVENOUS
  Administered 2011-07-08: 1 mg via INTRAVENOUS
  Administered 2011-07-08 (×2): 1 mg/h via INTRAVENOUS
  Filled 2011-07-02 (×3): qty 2

## 2011-07-02 MED ORDER — LACTATED RINGERS IV SOLN
INTRAVENOUS | Status: DC | PRN
  Administered 2011-07-02 (×2): via INTRAVENOUS

## 2011-07-02 MED ORDER — SUCCINYLCHOLINE CHLORIDE 20 MG/ML IJ SOLN
INTRAMUSCULAR | Status: DC | PRN
  Administered 2011-07-02: 100 mg via INTRAVENOUS

## 2011-07-02 MED ORDER — SODIUM CHLORIDE 0.9 % IR SOLN
Status: DC | PRN
  Administered 2011-07-02: 1

## 2011-07-02 MED ORDER — PROPOFOL 10 MG/ML IV EMUL
INTRAVENOUS | Status: DC | PRN
  Administered 2011-07-02: 10 mL via INTRAVENOUS

## 2011-07-02 SURGICAL SUPPLY — 73 items
1.3 WIRE ×2 IMPLANT
BANDAGE ELASTIC 4 VELCRO ST LF (GAUZE/BANDAGES/DRESSINGS) ×2 IMPLANT
BANDAGE ELASTIC 6 VELCRO ST LF (GAUZE/BANDAGES/DRESSINGS) ×2 IMPLANT
BANDAGE GAUZE ELAST BULKY 4 IN (GAUZE/BANDAGES/DRESSINGS) ×2 IMPLANT
BIT DRILL 2.7 QC CANN 155 (BIT) ×2 IMPLANT
BIT DRILL LONG 4.0 (BIT) ×1 IMPLANT
BIT DRILL SHORT 4.0 (BIT) ×3 IMPLANT
BLADE SURG 10 STRL SS (BLADE) IMPLANT
BNDG COHESIVE 4X5 TAN STRL (GAUZE/BANDAGES/DRESSINGS) ×2 IMPLANT
CLOTH BEACON ORANGE TIMEOUT ST (SAFETY) ×2 IMPLANT
COVER MAYO STAND STRL (DRAPES) IMPLANT
COVER SURGICAL LIGHT HANDLE (MISCELLANEOUS) ×2 IMPLANT
CUFF TOURNIQUET SINGLE 34IN LL (TOURNIQUET CUFF) IMPLANT
CUFF TOURNIQUET SINGLE 44IN (TOURNIQUET CUFF) IMPLANT
DRAPE C-ARM 42X72 X-RAY (DRAPES) ×2 IMPLANT
DRAPE INCISE IOBAN 66X45 STRL (DRAPES) IMPLANT
DRAPE ORTHO SPLIT 77X108 STRL (DRAPES) ×3
DRAPE PROXIMA HALF (DRAPES) IMPLANT
DRAPE SURG ORHT 6 SPLT 77X108 (DRAPES) ×3 IMPLANT
DRESSING OPSITE X SMALL 2X3 (GAUZE/BANDAGES/DRESSINGS) ×2 IMPLANT
DRILL BIT LONG 4.0 (BIT) ×2
DRILL BIT SHORT 4.0 (BIT) ×3
DURAPREP 26ML APPLICATOR (WOUND CARE) ×2 IMPLANT
ELECT REM PT RETURN 9FT ADLT (ELECTROSURGICAL) ×2
ELECTRODE REM PT RTRN 9FT ADLT (ELECTROSURGICAL) ×1 IMPLANT
GAUZE SPONGE 4X4 12PLY STRL LF (GAUZE/BANDAGES/DRESSINGS) ×2 IMPLANT
GAUZE XEROFORM 1X8 LF (GAUZE/BANDAGES/DRESSINGS) ×2 IMPLANT
GAUZE XEROFORM 5X9 LF (GAUZE/BANDAGES/DRESSINGS) ×2 IMPLANT
GLOVE BIOGEL PI IND STRL 8 (GLOVE) ×1 IMPLANT
GLOVE BIOGEL PI INDICATOR 8 (GLOVE) ×1
GLOVE ORTHO TXT STRL SZ7.5 (GLOVE) ×4 IMPLANT
GOWN PREVENTION PLUS LG XLONG (DISPOSABLE) IMPLANT
GOWN PREVENTION PLUS XLARGE (GOWN DISPOSABLE) ×2 IMPLANT
GOWN STRL NON-REIN LRG LVL3 (GOWN DISPOSABLE) ×2 IMPLANT
GUIDE PIN 3.2MM (MISCELLANEOUS) ×1
GUIDE PIN ORTH 343X3.2XBRAD (MISCELLANEOUS) ×1 IMPLANT
GUIDE ROD 3.0 (MISCELLANEOUS) ×2
KIT BASIN OR (CUSTOM PROCEDURE TRAY) ×2 IMPLANT
KIT ROOM TURNOVER OR (KITS) ×2 IMPLANT
MANIFOLD NEPTUNE II (INSTRUMENTS) ×2 IMPLANT
NAIL TIBIAL 8.5X32 (Nail) ×2 IMPLANT
NEEDLE HYPO 21X1.5 SAFETY (NEEDLE) ×2 IMPLANT
NS IRRIG 1000ML POUR BTL (IV SOLUTION) ×2 IMPLANT
PACK ORTHO EXTREMITY (CUSTOM PROCEDURE TRAY) ×2 IMPLANT
PAD ARMBOARD 7.5X6 YLW CONV (MISCELLANEOUS) ×4 IMPLANT
PAD CAST 4YDX4 CTTN HI CHSV (CAST SUPPLIES) ×1 IMPLANT
PADDING CAST COTTON 4X4 STRL (CAST SUPPLIES) ×1
PADDING WEBRIL 4 STERILE (GAUZE/BANDAGES/DRESSINGS) ×2 IMPLANT
PADDING WEBRIL 6 STERILE (GAUZE/BANDAGES/DRESSINGS) ×2 IMPLANT
ROD GUIDE 3.0 (MISCELLANEOUS) ×1 IMPLANT
SCREW 4.5MM X 35MM (Screw) ×2 IMPLANT
SCREW 4.5X25MM (Screw) ×2 IMPLANT
SCREW 4.5X30MM (Screw) ×2 IMPLANT
SCREW CANN2.5XFLUT 38X14X4 (Screw) ×1 IMPLANT
SCREW CANNULATED 4.0X38 (Screw) ×1 IMPLANT
SPLINT PLASTER CAST XFAST 5X30 (CAST SUPPLIES) ×1 IMPLANT
SPLINT PLASTER XFAST SET 5X30 (CAST SUPPLIES) ×1
SPONGE GAUZE 4X4 12PLY (GAUZE/BANDAGES/DRESSINGS) ×2 IMPLANT
SPONGE LAP 18X18 X RAY DECT (DISPOSABLE) ×2 IMPLANT
SPONGE LAP 4X18 X RAY DECT (DISPOSABLE) ×2 IMPLANT
STAPLER VISISTAT 35W (STAPLE) ×2 IMPLANT
STOCKINETTE IMPERVIOUS LG (DRAPES) ×2 IMPLANT
SUT VIC AB 0 CT1 27 (SUTURE) ×1
SUT VIC AB 0 CT1 27XBRD ANBCTR (SUTURE) ×1 IMPLANT
SUT VIC AB 0 CTB1 27 (SUTURE) ×2 IMPLANT
SUT VIC AB 2-0 CT1 27 (SUTURE) ×2
SUT VIC AB 2-0 CT1 TAPERPNT 27 (SUTURE) ×2 IMPLANT
SYR CONTROL 10ML LL (SYRINGE) ×2 IMPLANT
TOWEL OR 17X24 6PK STRL BLUE (TOWEL DISPOSABLE) ×2 IMPLANT
TOWEL OR 17X26 10 PK STRL BLUE (TOWEL DISPOSABLE) ×2 IMPLANT
TUBE CONNECTING 12X1/4 (SUCTIONS) ×2 IMPLANT
WATER STERILE IRR 1000ML POUR (IV SOLUTION) ×2 IMPLANT
YANKAUER SUCT BULB TIP NO VENT (SUCTIONS) ×2 IMPLANT

## 2011-07-02 NOTE — Progress Notes (Signed)
The patient was seen, examined and PA note reviewed and data reviewed.  I agree with the plan of action. 
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
## 2011-07-02 NOTE — Transfer of Care (Signed)
Immediate Anesthesia Transfer of Care Note  Patient: [name]  Procedure(s) Performed:  INTRAMEDULLARY (IM) NAIL TIBIAL  Patient Location: NICU  Anesthesia Type: General  Level of Consciousness: sedated, unresponsive and Patient remains intubated per anesthesia plan  Airway & Oxygen Therapy: Patient remains intubated per anesthesia plan  Post-op Assessment: Report given to PACU RN and Post -op Vital signs reviewed and stable  Post vital signs: Reviewed and stable  Complications: No apparent anesthesia complications

## 2011-07-02 NOTE — Anesthesia Postprocedure Evaluation (Signed)
  Anesthesia Post-op Note  Patient: [name]  Procedure(s) Performed:  INTRAMEDULLARY (IM) NAIL TIBIAL  Patient Location: ICU  Anesthesia Type: General  Level of Consciousness: sedated  Airway and Oxygen Therapy: Patient remains intubated per anesthesia plan  Post-op Pain: none  Post-op Assessment: Post-op Vital signs reviewed, Patient's Cardiovascular Status Stable and Respiratory Function Stable  Post-op Vital Signs: Reviewed and stable  Complications: No apparent anesthesia complications
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
## 2011-07-02 NOTE — Anesthesia Preprocedure Evaluation (Addendum)
Anesthesia Evaluation  Patient identified by MRN, date of birth, ID band Patient awake    Reviewed: Allergy & Precautions, H&P , NPO status , Patient's Chart, lab work & pertinent test results, reviewed documented beta blocker date and time   Airway Mallampati: III TM Distance: >3 FB Neck ROM: Limited  Mouth opening: Limited Mouth Opening  Dental  (+) Teeth Intact   Pulmonary Current Smoker,    + decreased breath sounds      Cardiovascular hypertension, Pt. on medications and Pt. on home beta blockers + CAD and + Cardiac Stents Regular Normal    Neuro/Psych    GI/Hepatic negative GI ROS, Neg liver ROS,   Endo/Other  Diabetes mellitus-, Well Controlled, Type 2  Renal/GU negative Renal ROS     Musculoskeletal   Abdominal (+) obese,   Peds  Hematology   Anesthesia Other Findings   Reproductive/Obstetrics                           Anesthesia Physical Anesthesia Plan  ASA: III  Anesthesia Plan: General   Post-op Pain Management:    Induction: Intravenous  Airway Management Planned: Oral ETT and Video Laryngoscope Planned  Additional Equipment:   Intra-op Plan:   Post-operative Plan: Extubation in OR and Possible Post-op intubation/ventilation  Informed Consent: I have reviewed the patients History and Physical, chart, labs and discussed the procedure including the risks, benefits and alternatives for the proposed anesthesia with the patient or authorized representative who has indicated his/her understanding and acceptance.   Dental advisory given  Plan Discussed with: CRNA  Anesthesia Plan Comments:        Anesthesia Quick Evaluation
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
## 2011-07-02 NOTE — Progress Notes (Signed)
07/02/11  12:00 PT Note: Noted PT order discontinued.  Spoke with Charma Igo, PA, who will reorder PT when appropriate.  Thanks!  07/02/2011 Cephus Shelling, PT, DPT [phone]

## 2011-07-02 NOTE — Anesthesia Procedure Notes (Addendum)
Procedure Name: Intubation Date/Time: 07/02/2011 7:49 AM Performed by: Leona Singleton A. Oxygen Delivery Method: Circle System Utilized Preoxygenation: Pre-oxygenation with 100% oxygen Intubation Type: IV induction Ventilation: Mask ventilation without difficulty Grade View: Grade I Tube type: Oral Tube size: 7.5 mm Number of attempts: 1 Airway Equipment and Method: video-laryngoscopy Placement Confirmation: ETT inserted through vocal cords under direct vision,  breath sounds checked- equal and bilateral and positive ETCO2 Secured at: 23 cm Tube secured with: Tape Dental Injury: Teeth and Oropharynx as per pre-operative assessment  Difficulty Due To: Difficult Airway- due to cervical collar, Difficult Airway- due to limited oral opening, Difficult Airway-  due to neck instability and Difficulty was anticipated Future Recommendations: Recommend- induction with short-acting agent, and alternative techniques readily available Comments: CSpine neutral throughout intubation.

## 2011-07-02 NOTE — ED Provider Notes (Signed)
I saw and evaluated the patient, reviewed the resident's note and I agree with the findings and plan. Please see my ED provider note for further details.   Laray Anger, DO 07/02/11 0401

## 2011-07-02 NOTE — Addendum Note (Signed)
Addendum  created 07/02/11 1436 by Alphonsus Sias. Darry Kelnhofer   Modules edited:Charges VN

## 2011-07-02 NOTE — Preoperative (Signed)
Beta Blockers   Reason not to administer Beta Blockers:Not Applicable 

## 2011-07-03 ENCOUNTER — Encounter (HOSPITAL_COMMUNITY): Payer: Self-pay | Admitting: Anesthesiology

## 2011-07-03 ENCOUNTER — Inpatient Hospital Stay (HOSPITAL_COMMUNITY): Payer: No Typology Code available for payment source

## 2011-07-03 ENCOUNTER — Encounter (HOSPITAL_COMMUNITY): Payer: Self-pay | Admitting: Orthopaedic Surgery

## 2011-07-03 LAB — BASIC METABOLIC PANEL
BUN: 5 mg/dL — ABNORMAL LOW (ref 6–23)
Creatinine, Ser: 0.39 mg/dL — ABNORMAL LOW (ref 0.50–1.10)
GFR calc non Af Amer: >90 mL/min (ref >90)
Glucose, Bld: 188 mg/dL — ABNORMAL HIGH (ref 70–99)
Potassium: 3.9 mEq/L (ref 3.5–5.1)

## 2011-07-03 LAB — TYPE AND SCREEN
ABO/RH(D): A POS
Antibody Screen: NEGATIVE
Unit division: 0
Unit division: 0
Unit division: 0
Unit division: 0

## 2011-07-03 LAB — CBC
HCT: 26.5 % — ABNORMAL LOW (ref 36.0–46.0)
Hemoglobin: 8.7 g/dL — ABNORMAL LOW (ref 12.0–15.0)
MCHC: 32.8 g/dL (ref 30.0–36.0)
MCV: 91.4 fL (ref 78.0–100.0)

## 2011-07-03 LAB — GLUCOSE, CAPILLARY
Glucose-Capillary: 201 mg/dL — ABNORMAL HIGH (ref 70–99)
Glucose-Capillary: 181 mg/dL — ABNORMAL HIGH (ref 70–99)

## 2011-07-03 LAB — PREPARE RBC (CROSSMATCH)

## 2011-07-03 MED ORDER — HETASTARCH-ELECTROLYTES 6 % IV SOLN
500.0000 mL | Freq: Once | INTRAVENOUS | Status: AC
  Administered 2011-07-03: 500 mL via INTRAVENOUS
  Filled 2011-07-03: qty 500

## 2011-07-03 MED ORDER — INSULIN ASPART 100 UNIT/ML ~~LOC~~ SOLN
0.0000 [IU] | SUBCUTANEOUS | Status: DC
  Administered 2011-07-03 (×2): 1 [IU] via SUBCUTANEOUS
  Administered 2011-07-03 (×2): 3 [IU] via SUBCUTANEOUS
  Administered 2011-07-04 (×3): 1 [IU] via SUBCUTANEOUS
  Administered 2011-07-05 (×3): 3 [IU] via SUBCUTANEOUS
  Administered 2011-07-05 (×2): 1 [IU] via SUBCUTANEOUS
  Administered 2011-07-05 – 2011-07-06 (×5): 3 [IU] via SUBCUTANEOUS
  Administered 2011-07-06: 1 [IU] via SUBCUTANEOUS
  Administered 2011-07-06: 3 [IU] via SUBCUTANEOUS
  Filled 2011-07-03: qty 3

## 2011-07-03 MED ORDER — DEXTROSE 10 % IV SOLN: INTRAVENOUS | Status: DC

## 2011-07-03 MED ORDER — INSULIN ASPART 100 UNIT/ML ~~LOC~~ SOLN: 0.0000 [IU] | SUBCUTANEOUS | Status: DC

## 2011-07-03 MED ORDER — PRO-STAT SUGAR FREE PO LIQD
30.0000 mL | Freq: Four times a day (QID) | ORAL | Status: DC
  Administered 2011-07-03 – 2011-07-07 (×17): 30 mL
  Filled 2011-07-03 (×20): qty 30

## 2011-07-03 MED ORDER — PIVOT 1.5 CAL PO LIQD
1000.0000 mL | ORAL | Status: DC
  Administered 2011-07-03 – 2011-07-05 (×3): 1000 mL
  Filled 2011-07-03 (×5): qty 1000

## 2011-07-03 MED ORDER — VANCOMYCIN HCL IN DEXTROSE 1-5 GM/200ML-% IV SOLN
1000.0000 mg | Freq: Once | INTRAVENOUS | Status: AC
  Administered 2011-07-04: 1000 mg via INTRAVENOUS
  Filled 2011-07-03: qty 200

## 2011-07-03 MED ORDER — SODIUM CHLORIDE 0.9 % IV SOLN: INTRAVENOUS | Status: DC

## 2011-07-03 MED ORDER — BIOTENE DRY MOUTH MT LIQD
15.0000 mL | Freq: Four times a day (QID) | OROMUCOSAL | Status: DC
  Administered 2011-07-03 – 2011-07-17 (×53): 15 mL via OROMUCOSAL

## 2011-07-03 MED ORDER — CHLORHEXIDINE GLUCONATE 0.12 % MT SOLN
15.0000 mL | Freq: Two times a day (BID) | OROMUCOSAL | Status: DC
  Administered 2011-07-03 – 2011-07-17 (×26): 15 mL via OROMUCOSAL
  Filled 2011-07-03 (×27): qty 15

## 2011-07-03 NOTE — Progress Notes (Signed)
07/03/2011                            OT order noted as discontinued, OT will await reorder when appropriate. OT sign-off    Lucile Shutters   OTR/L Pager: [phone] Office: [phone] .
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
## 2011-07-03 NOTE — Progress Notes (Signed)
Per MD order for BAL. Pre O2 at FIO2 100%. Bal done using steril procedure with combicath. Instilled 20cc got 5cc specimen in return. Labeled and walked to lab. Pt remains on FIO2 100% . Pt tol well
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
## 2011-07-04 ENCOUNTER — Inpatient Hospital Stay (HOSPITAL_COMMUNITY): Payer: No Typology Code available for payment source

## 2011-07-04 ENCOUNTER — Encounter (HOSPITAL_COMMUNITY): Admission: EM | Disposition: A | Discharge status: Skilled Nursing Facility | Payer: Self-pay | Source: Home / Self Care

## 2011-07-04 LAB — BLOOD GAS, ARTERIAL
Acid-Base Excess: 0.6 mmol/L (ref 0.0–2.0)
Bicarbonate: 28.2 mEq/L — ABNORMAL HIGH (ref 20.0–24.0)
Bicarbonate: 28.6 mEq/L — ABNORMAL HIGH (ref 20.0–24.0)
Drawn by: 320991
Drawn by: 320991
FIO2: 0.7 %
FIO2: 0.7 %
MECHVT: 450 mL
O2 Saturation: 74 %
O2 Saturation: 96.1 %
O2 Saturation: 98.6 %
PEEP: 10 cmH2O
PEEP: 10 cmH2O
Patient temperature: 98.6
RATE: 14 resp/min
RATE: 14 resp/min
RATE: 18 resp/min
TCO2: 28.6 mmol/L (ref 0–100)
pCO2 arterial: 46.8 mmHg — ABNORMAL HIGH (ref 35.0–45.0)
pH, Arterial: 7.353 (ref 7.350–7.400)
pO2, Arterial: 72.1 mmHg — ABNORMAL LOW (ref 80.0–100.0)
pO2, Arterial: 108 mmHg — ABNORMAL HIGH (ref 80.0–100.0)

## 2011-07-04 LAB — CBC
HCT: 22.6 % — ABNORMAL LOW (ref 36.0–46.0)
Hemoglobin: 7.3 g/dL — ABNORMAL LOW (ref 12.0–15.0)
MCH: 29.8 pg (ref 26.0–34.0)
RBC: 2.45 MIL/uL — ABNORMAL LOW (ref 3.87–5.11)

## 2011-07-04 LAB — BASIC METABOLIC PANEL
BUN: 9 mg/dL (ref 6–23)
CO2: 28 mEq/L (ref 19–32)
Glucose, Bld: 175 mg/dL — ABNORMAL HIGH (ref 70–99)
Potassium: 3.9 mEq/L (ref 3.5–5.1)
Sodium: 140 mEq/L (ref 135–145)

## 2011-07-04 LAB — GLUCOSE, CAPILLARY
Glucose-Capillary: 155 mg/dL — ABNORMAL HIGH (ref 70–99)
Glucose-Capillary: 178 mg/dL — ABNORMAL HIGH (ref 70–99)
Glucose-Capillary: 204 mg/dL — ABNORMAL HIGH (ref 70–99)

## 2011-07-04 LAB — URINE CULTURE

## 2011-07-04 SURGERY — OPEN REDUCTION INTERNAL FIXATION (ORIF) ACETABULAR FRACTURE
Anesthesia: General | Laterality: Left

## 2011-07-04 MED ORDER — SODIUM CHLORIDE 0.9 % IJ SOLN
10.0000 mL | INTRAMUSCULAR | Status: DC | PRN
  Administered 2011-07-10 – 2011-07-17 (×2): 10 mL

## 2011-07-04 MED ORDER — PIVOT 1.5 CAL PO LIQD
1000.0000 mL | ORAL | Status: DC
  Filled 2011-07-04 (×4): qty 1000

## 2011-07-04 MED ORDER — LEVOFLOXACIN IN D5W 750 MG/150ML IV SOLN
750.0000 mg | INTRAVENOUS | Status: DC
  Administered 2011-07-04: 750 mg via INTRAVENOUS
  Filled 2011-07-04 (×2): qty 150

## 2011-07-04 MED ORDER — FUROSEMIDE 10 MG/ML IJ SOLN
20.0000 mg | Freq: Once | INTRAMUSCULAR | Status: AC
  Administered 2011-07-04: 20 mg via INTRAVENOUS
  Filled 2011-07-04: qty 2

## 2011-07-04 MED ORDER — SODIUM CHLORIDE 0.9 % IJ SOLN
10.0000 mL | Freq: Two times a day (BID) | INTRAMUSCULAR | Status: DC
  Administered 2011-07-04 – 2011-07-17 (×25): 10 mL

## 2011-07-04 NOTE — Progress Notes (Signed)
Patient ID: [name], female   DOB: [birth year], 45 y.o.   MRN: [id number] I met with the patient's father and brother at the bedside. I updated them on the clinical course.  Questions answered.
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
## 2011-07-04 NOTE — Progress Notes (Signed)
At bedside with pt. Pt became very agitated, pulling and biting on ETT. O2 SATs dropped to 65%. MD contacted. Possible seizures activity seen. Thick secretions suctioned orally. Pt manually bagged for approximately 10 min. It was very difficult to bag her at first. Stat portable chest xray and ABG ordered. Vent settings changed.
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
## 2011-07-05 ENCOUNTER — Inpatient Hospital Stay (HOSPITAL_COMMUNITY): Payer: No Typology Code available for payment source

## 2011-07-05 LAB — BASIC METABOLIC PANEL
CO2: 27 mEq/L (ref 19–32)
Chloride: 108 mEq/L (ref 96–112)
Glucose, Bld: 214 mg/dL — ABNORMAL HIGH (ref 70–99)
Potassium: 3.9 mEq/L (ref 3.5–5.1)
Sodium: 142 mEq/L (ref 135–145)

## 2011-07-05 LAB — BLOOD GAS, ARTERIAL
O2 Saturation: 94.3 %
PEEP: 10 cmH2O
Patient temperature: 98.6
RATE: 18 resp/min

## 2011-07-05 LAB — CBC
Hemoglobin: 6.8 g/dL — CL (ref 12.0–15.0)
RBC: 2.31 MIL/uL — ABNORMAL LOW (ref 3.87–5.11)

## 2011-07-05 LAB — GLUCOSE, CAPILLARY
Glucose-Capillary: 211 mg/dL — ABNORMAL HIGH (ref 70–99)
Glucose-Capillary: 216 mg/dL — ABNORMAL HIGH (ref 70–99)

## 2011-07-05 MED ORDER — VANCOMYCIN HCL 1000 MG IV SOLR
2000.0000 mg | Freq: Once | INTRAVENOUS | Status: AC
  Administered 2011-07-05: 2000 mg via INTRAVENOUS
  Filled 2011-07-05 (×2): qty 2000

## 2011-07-05 MED ORDER — FUROSEMIDE 10 MG/ML IJ SOLN
INTRAMUSCULAR | Status: AC
  Administered 2011-07-05: 20 mg via INTRAVENOUS
  Filled 2011-07-05: qty 4

## 2011-07-05 MED ORDER — FUROSEMIDE 10 MG/ML IJ SOLN
20.0000 mg | Freq: Once | INTRAMUSCULAR | Status: AC
  Administered 2011-07-05: 20 mg via INTRAVENOUS

## 2011-07-05 MED ORDER — LEVOFLOXACIN IN D5W 750 MG/150ML IV SOLN
750.0000 mg | INTRAVENOUS | Status: DC
  Administered 2011-07-05 – 2011-07-11 (×7): 750 mg via INTRAVENOUS
  Filled 2011-07-05 (×8): qty 150

## 2011-07-05 MED ORDER — ACETAMINOPHEN 325 MG PO TABS
650.0000 mg | ORAL_TABLET | Freq: Once | ORAL | Status: AC
  Administered 2011-07-05: 650 mg via NASOGASTRIC
  Filled 2011-07-05: qty 2

## 2011-07-05 MED ORDER — PANTOPRAZOLE SODIUM 40 MG PO PACK
40.0000 mg | PACK | Freq: Every day | ORAL | Status: DC
  Administered 2011-07-05 – 2011-07-16 (×10): 40 mg
  Filled 2011-07-05 (×13): qty 20

## 2011-07-05 MED ORDER — FUROSEMIDE 10 MG/ML IJ SOLN: 20.0000 mg | Freq: Once | INTRAMUSCULAR | Status: DC

## 2011-07-05 MED ORDER — VANCOMYCIN HCL IN DEXTROSE 1-5 GM/200ML-% IV SOLN
1000.0000 mg | Freq: Two times a day (BID) | INTRAVENOUS | Status: DC
  Administered 2011-07-06 – 2011-07-10 (×10): 1000 mg via INTRAVENOUS
  Filled 2011-07-05 (×10): qty 200

## 2011-07-05 MED ORDER — ACETAMINOPHEN 160 MG/5ML PO SOLN
650.0000 mg | Freq: Four times a day (QID) | ORAL | Status: DC | PRN
  Administered 2011-07-05 – 2011-07-15 (×14): 650 mg via ORAL
  Filled 2011-07-05 (×15): qty 20.3

## 2011-07-05 MED ORDER — INSULIN GLARGINE 100 UNIT/ML ~~LOC~~ SOLN
10.0000 [IU] | Freq: Every day | SUBCUTANEOUS | Status: DC
  Administered 2011-07-05 – 2011-07-06 (×2): 10 [IU] via SUBCUTANEOUS
  Filled 2011-07-05: qty 3

## 2011-07-05 MED ORDER — ACETAMINOPHEN 650 MG RE SUPP: 650.0000 mg | RECTAL | Status: DC | PRN

## 2011-07-05 NOTE — Progress Notes (Signed)
Follow up - Critical Care Medicine Note  Patient Details:    RAFEEF [name] is an 45 y.o. female.  Lines/tubes : AIRWAYS 7.5 mm (Active)  Secured at (cm) 23 cm 07/02/2011 12:00 AM     Airway 7.5 mm (Active)  Secured at (cm) 24 cm 07/05/2011  7:50 AM  Measured From Lips 07/05/2011  7:50 AM  Secured Location Center 07/05/2011  7:50 AM  Secured By [employer] 07/05/2011  7:50 AM  Tube Holder Repositioned Yes 07/05/2011  7:50 AM  Cuff Pressure (cm H2O) 25 cm H2O 07/05/2011  7:50 AM  Site Condition Dry 07/05/2011  7:50 AM     PICC Triple Lumen 07/04/11 PICC Right Basilic (Active)  Site Assessment Clean;Dry;Intact 07/05/2011  9:00 AM  Lumen #1 Status Infusing 07/05/2011  9:00 AM  Lumen #2 Status Flushed;Blood return noted;Saline locked 07/05/2011  9:00 AM  Lumen #3 Status Flushed;Blood return noted;Saline locked 07/05/2011  9:00 AM  Line Care Tubing changed 07/04/2011  4:00 PM  Dressing Type Transparent;Occlusive 07/05/2011  9:00 AM  Dressing Status Clean;Dry;Intact 07/05/2011  9:00 AM  Indication for Insertion or Continuance of Line Limited venous access - need for IV therapy >5 days (PICC only) 07/05/2011  9:00 AM     Arterial Line 07/04/11 Right Radial (Active)  Site Assessment Clean;Dry;Intact 07/05/2011  9:00 AM  Line Status Pulsatile blood flow 07/05/2011  9:00 AM  Art Line Waveform Appropriate 07/05/2011  9:00 AM  Art Line Interventions Zeroed and calibrated 07/05/2011  9:00 AM  Color/Movement/Sensation Capillary refill less than 3 sec 07/05/2011  9:00 AM  Dressing Type Transparent 07/05/2011  9:00 AM  Dressing Status Clean;Dry;Intact 07/05/2011  9:00 AM  Interventions New dressing;Other (Comment) 07/04/2011  1:00 AM     NG/OG Tube Orogastric Right mouth (Active)  Placement Verification Auscultation 07/05/2011  7:00 AM  Site Assessment Clean;Dry;Intact 07/05/2011  7:00 AM  Status Infusing tube feed 07/05/2011  7:00 AM  Drainage Appearance Tan 07/05/2011  7:00 AM    Gastric Residual 95 mL 07/05/2011  7:00 AM  Intake (mL) 30 mL 07/05/2011  8:00 AM     Urethral Catheter Non-latex 14 Fr. (Active)  Site Assessment Clean;Intact 07/05/2011  7:00 AM  Collection Container Standard drainage bag 07/05/2011  7:00 AM  Securement Method Leg strap 07/05/2011  7:00 AM  Urinary Catheter Interventions Unclamped 07/05/2011  7:00 AM  Indication for Insertion or Continuance of Catheter Prolonged immobilization;Urinary output monitoring 07/05/2011  7:00 AM  Output (mL) 60 mL 07/05/2011  9:00 AM    Microbiology/Sepsis markers: Results for orders placed during the hospital encounter of 06/30/11  MRSA PCR SCREENING     Status: Normal   Collection Time   07/01/11  1:57 AM      Component Value Range Status Comment   MRSA by PCR NEGATIVE  NEGATIVE  Final   URINE CULTURE     Status: Normal   Collection Time   07/03/11 10:43 AM      Component Value Range Status Comment   Specimen Description URINE, CATHETERIZED   Final    Special Requests NONE   Final    Setup Time [id number]   Final    Colony Count NO GROWTH   Final    Culture NO GROWTH   Final    Report Status 07/04/2011 FINAL   Final   CULTURE, BAL-QUANTITATIVE     Status: Normal (Preliminary result)   Collection Time   07/03/11 11:00 AM      Component Value Range  Status Comment   Specimen Description BRONCHIAL ALVEOLAR LAVAGE   Final    Special Requests NONE   Final    Gram Stain     Final    Value: ABUNDANT WBC PRESENT, PREDOMINANTLY PMN     NO ORGANISMS SEEN   Colony Count PENDING   Incomplete    Culture Culture reincubated for better growth   Final    Report Status PENDING   Incomplete     Anti-infectives:  Anti-infectives     Start     Dose/Rate Route Frequency Ordered Stop   07/04/11 1100   Levofloxacin (LEVAQUIN) IVPB 750 mg        750 mg 100 mL/hr over 90 Minutes Intravenous Every 24 hours 07/04/11 1049     07/04/11 0800   vancomycin (VANCOCIN) IVPB 1000 mg/200 mL premix        1,000 mg 200  mL/hr over 60 Minutes Intravenous  Once 07/03/11 1140 07/04/11 0939   07/02/11 1400   clindamycin (CLEOCIN) IVPB 600 mg        600 mg 100 mL/hr over 30 Minutes Intravenous 3 times per day 07/02/11 1035 07/03/11 0715   07/02/11 0800   clindamycin (CLEOCIN) IVPB 600 mg        600 mg 100 mL/hr over 30 Minutes Intravenous To Surgery 07/02/11 0721 07/02/11 0750          Best Practice/Protocols:  VTE Prophylaxis: Mechanical GI Prophylaxis: Proton Pump Inhibitor Continous Sedation  Consults: Treatment Team:  Rolanda Lundborg Kritzer Liz Beach, MD    Events:  Subjective:    Overnight Issues: Has not gotten significantly better overnight.  Anemic to HGB < 7 this morning.  Objective:  Vital signs for last 24 hours: Temp:  [99.4 F (37.4 C)-102.4 F (39.1 C)] 102.4 F (39.1 C) (12/12 0341) Pulse Rate:  [95-107] 97  (12/12 0800) Resp:  [18-23] 22  (12/12 0800) BP: (100-158)/(43-61) 117/55 mmHg (12/12 0750) SpO2:  [94 %-100 %] 100 % (12/12 0800) Arterial Line BP: (63-131)/(46-63) 110/52 mmHg (12/12 0800) FiO2 (%):  [49.7 %-70 %] 60 % (12/12 0800)  Hemodynamic parameters for last 24 hours:    Intake/Output from previous day: 12/11 0701 - 12/12 0700 In: 3975.5 [I.V.:2625.5; NG/GT:720; IV Piggyback:350] Out: 2475 [Urine:2475]  Intake/Output this shift: Total I/O In: 140.5 [I.V.:110.5; NG/GT:30] Out: 190 [Urine:190]  Vent settings for last 24 hours: Vent Mode:  [-] PRVC FiO2 (%):  [49.7 %-70 %] 60 % Set Rate:  [18 bmp] 18 bmp Vt Set:  [550 mL] 550 mL PEEP:  [10 cmH20] 10 cmH20 Plateau Pressure:  [29 cmH20-32 cmH20] 32 cmH20  Physical Exam:  General: Sedated on continuous drips Neuro: RASS -3 or deeper Resp: diminished breath sounds bilaterally and rhonchi LLL and RLL GI: Soft, flat, nontender, tolerating tube feedings well. Extremities: no edema, no erythema, pulses WNL  Results for orders placed during the hospital  encounter of 06/30/11 (from the past 24 hour(s))  BLOOD GAS, ARTERIAL     Status: Abnormal   Collection Time   07/04/11 11:59 AM      Component Value Range   FIO2 .70     Delivery systems VENTILATOR     Mode PRESSURE REGULATED VOLUME CONTROL     VT 550     Rate 18     Peep/cpap 10.0     pH, Arterial 7.403 (*) 7.350 - 7.400    pCO2 arterial 46.8 (*) 35.0 - 45.0 (mmHg)  pO2, Arterial 108.0 (*) 80.0 - 100.0 (mmHg)   Bicarbonate 28.6 (*) 20.0 - 24.0 (mEq/L)   TCO2 30.0  0 - 100 (mmol/L)   Acid-Base Excess 4.1 (*) 0.0 - 2.0 (mmol/L)   O2 Saturation 98.6     Patient temperature 98.6     Collection site A-LINE     Drawn by [phone]     Sample type ARTERIAL DRAW    GLUCOSE, CAPILLARY     Status: Abnormal   Collection Time   07/04/11 12:26 PM      Component Value Range   Glucose-Capillary 178 (*) 70 - 99 (mg/dL)  GLUCOSE, CAPILLARY     Status: Abnormal   Collection Time   07/04/11  4:01 PM      Component Value Range   Glucose-Capillary 155 (*) 70 - 99 (mg/dL)  GLUCOSE, CAPILLARY     Status: Abnormal   Collection Time   07/04/11  7:37 PM      Component Value Range   Glucose-Capillary 163 (*) 70 - 99 (mg/dL)  GLUCOSE, CAPILLARY     Status: Abnormal   Collection Time   07/04/11 11:49 PM      Component Value Range   Glucose-Capillary 175 (*) 70 - 99 (mg/dL)  GLUCOSE, CAPILLARY     Status: Abnormal   Collection Time   07/05/11  3:37 AM      Component Value Range   Glucose-Capillary 196 (*) 70 - 99 (mg/dL)  BLOOD GAS, ARTERIAL     Status: Abnormal   Collection Time   07/05/11  4:10 AM      Component Value Range   FIO2 50.00     Delivery systems VENTILATOR     Mode PRESSURE REGULATED VOLUME CONTROL     VT 550     Rate 18     Peep/cpap 10.0     pH, Arterial 7.422 (*) 7.350 - 7.400    pCO2 arterial 42.7  35.0 - 45.0 (mmHg)   pO2, Arterial 60.4 (*) 80.0 - 100.0 (mmHg)   Bicarbonate 27.3 (*) 20.0 - 24.0 (mEq/L)   TCO2 28.6  0 - 100 (mmol/L)   Acid-Base Excess 3.1 (*) 0.0 -  2.0 (mmol/L)   O2 Saturation 94.3     Patient temperature 98.6     Collection site A-LINE     Drawn by COLLECTED BY NURSE     Sample type ARTERIAL DRAW    CBC     Status: Abnormal   Collection Time   07/05/11  4:45 AM      Component Value Range   WBC 11.9 (*) 4.0 - 10.5 (K/uL)   RBC 2.31 (*) 3.87 - 5.11 (MIL/uL)   Hemoglobin 6.8 (*) 12.0 - 15.0 (g/dL)   HCT 14.7 (*) 82.9 - 46.0 (%)   MCV 93.1  78.0 - 100.0 (fL)   MCH 29.4  26.0 - 34.0 (pg)   MCHC 31.6  30.0 - 36.0 (g/dL)   RDW 56.2  13.0 - 86.5 (%)   Platelets 187  150 - 400 (K/uL)  BASIC METABOLIC PANEL     Status: Abnormal   Collection Time   07/05/11  4:45 AM      Component Value Range   Sodium 142  135 - 145 (mEq/L)   Potassium 3.9  3.5 - 5.1 (mEq/L)   Chloride 108  96 - 112 (mEq/L)   CO2 27  19 - 32 (mEq/L)   Glucose, Bld 214 (*) 70 - 99 (mg/dL)   BUN 16  6 - 23 (mg/dL)   Creatinine, Ser 1.47 (*) 0.50 - 1.10 (mg/dL)   Calcium 7.1 (*) 8.4 - 10.5 (mg/dL)   GFR calc non Af Amer >90  >90 (mL/min)   GFR calc Af Amer >90  >90 (mL/min)     Assessment/Plan:   NEURO  Altered Mental Status:  sedation   Plan: Keep sedated until pulmonary issue resolve.  PULM  Interstitial Lung Disease: Seems more like fat emboli symdrome versus ARDS ARDS (etiology unknown and possible fat emboli syndrome)   Plan: Continue to support, possibly diurese  CARDIO  Sinus Tachycardia   Plan: No changes  RENAL  Slight increase in creatinine   Plan: Will likely diuresis after has been given some blood.  GI  No significant problems identified   Plan: Continue tube feedings for now  ID  Currently has a fever over 102. WBC is slightly increased.   Plan: On Levaquin only, may need to broaden antibiotics  HEME  Anemia acute blood loss anemia and anemia of critical illness) Leukocytosis (neutrophilia)   Plan: Give two units of blood and diurese.  Check all cultures and adjust antibiotics.  ENDO No acute concerns.  Will make sure glucose is  well controlled.   Plan: Check sugars.  Global Issues  The patient needs to have surgery on her extremities, but her current pulmonary condition prohibits that.  ARDS, ? PNA, patient does not have a subglottic suction ETT--Should be re-intubated at some point with the appropriate tube.    LOS: 5 days   Additional comments:I reviewed the patient's new clinical lab test results. CBC/Bmet, I reviewed the patients new imaging test results. CXR and I reviewed the patient's other test results. microbiology   Had a long discussion with the patient's husband. Critical Care Total Time*: 1 Hour  Aqil Goetting III,Leann Mayweather O 07/05/2011  *Care during the described time interval was provided by me and/or other providers on the critical care team.  I have reviewed this patient's available data, including medical history, events of note, physical examination and test results as part of my evaluation.

## 2011-07-05 NOTE — Progress Notes (Signed)
CRITICAL VALUE ALERT  Critical value received:  Hb 6.8  Date of notification:  07/05/11  Time of notification:  0555  Critical value read back: yes  Nurse who received alert: Nasteho Glantz, Tommye Standard   MD notified (1st page):  Dr Andrey Campanile  Time of first page:  0600  MD notified (2nd page):  Time of second page:  Responding MD:  Dr Andrey Campanile  Time MD responded:  0600
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
## 2011-07-06 ENCOUNTER — Inpatient Hospital Stay (HOSPITAL_COMMUNITY): Payer: No Typology Code available for payment source

## 2011-07-06 LAB — CBC
HCT: 21.9 % — ABNORMAL LOW (ref 36.0–46.0)
MCH: 30.1 pg (ref 26.0–34.0)
MCV: 92.8 fL (ref 78.0–100.0)
Platelets: 199 10*3/uL (ref 150–400)
RDW: 14.5 % (ref 11.5–15.5)

## 2011-07-06 LAB — BLOOD GAS, ARTERIAL
Bicarbonate: 26.3 mEq/L — ABNORMAL HIGH (ref 20.0–24.0)
Drawn by: 28701
FIO2: 0.6 %
PEEP: 10 cmH2O
RATE: 18 resp/min
pCO2 arterial: 41 mmHg (ref 35.0–45.0)
pO2, Arterial: 103 mmHg — ABNORMAL HIGH (ref 80.0–100.0)

## 2011-07-06 LAB — CULTURE, BAL-QUANTITATIVE W GRAM STAIN: Colony Count: 10000

## 2011-07-06 LAB — GLUCOSE, CAPILLARY
Glucose-Capillary: 235 mg/dL — ABNORMAL HIGH (ref 70–99)
Glucose-Capillary: 244 mg/dL — ABNORMAL HIGH (ref 70–99)
Glucose-Capillary: 199 mg/dL — ABNORMAL HIGH (ref 70–99)

## 2011-07-06 LAB — DIFFERENTIAL
Basophils Absolute: 0.1 10*3/uL (ref 0.0–0.1)
Eosinophils Absolute: 0 10*3/uL (ref 0.0–0.7)
Eosinophils Relative: 0 % (ref 0–5)
Lymphocytes Relative: 18 % (ref 12–46)
Monocytes Absolute: 0.8 10*3/uL (ref 0.1–1.0)

## 2011-07-06 LAB — BASIC METABOLIC PANEL
CO2: 26 mEq/L (ref 19–32)
Calcium: 7.1 mg/dL — ABNORMAL LOW (ref 8.4–10.5)
Creatinine, Ser: 0.42 mg/dL — ABNORMAL LOW (ref 0.50–1.10)
GFR calc non Af Amer: >90 mL/min (ref >90)
Glucose, Bld: 241 mg/dL — ABNORMAL HIGH (ref 70–99)
Sodium: 142 mEq/L (ref 135–145)

## 2011-07-06 MED ORDER — METOPROLOL TARTRATE 25 MG PO TABS
25.0000 mg | ORAL_TABLET | Freq: Every day | ORAL | Status: DC
  Administered 2011-07-06 – 2011-07-17 (×11): 25 mg via NASOGASTRIC
  Filled 2011-07-06 (×12): qty 1

## 2011-07-06 MED ORDER — BENAZEPRIL HCL 5 MG PO TABS
5.0000 mg | ORAL_TABLET | Freq: Every day | ORAL | Status: DC
  Administered 2011-07-06 – 2011-07-17 (×8): 5 mg via NASOGASTRIC
  Filled 2011-07-06 (×12): qty 1

## 2011-07-06 MED ORDER — LEVALBUTEROL HCL 0.63 MG/3ML IN NEBU
0.6300 mg | INHALATION_SOLUTION | Freq: Three times a day (TID) | RESPIRATORY_TRACT | Status: DC
  Administered 2011-07-06 – 2011-07-15 (×22): 0.63 mg via RESPIRATORY_TRACT
  Filled 2011-07-06 (×30): qty 3

## 2011-07-06 MED ORDER — PIVOT 1.5 CAL PO LIQD
1000.0000 mL | ORAL | Status: DC
  Administered 2011-07-06: 1000 mL via ORAL
  Filled 2011-07-06 (×3): qty 1000

## 2011-07-06 MED ORDER — SODIUM CHLORIDE 0.9 % IV SOLN
INTRAVENOUS | Status: DC
  Administered 2011-07-07: 11.4 [IU]/h via INTRAVENOUS
  Administered 2011-07-07: 2 [IU]/h via INTRAVENOUS
  Filled 2011-07-06 (×3): qty 1

## 2011-07-06 MED ORDER — FUROSEMIDE 10 MG/ML IJ SOLN
40.0000 mg | Freq: Once | INTRAMUSCULAR | Status: AC
  Administered 2011-07-06: 40 mg via INTRAVENOUS
  Filled 2011-07-06: qty 4
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
## 2011-07-06 NOTE — Progress Notes (Signed)
Patient ID: [name], female   DOB: 1966-07-09, 45 y.o.   MRN: [id number] Subjective: Patient reports Mildly responsive only secondary to sedation  Objective: Vital signs in last 24 hours: Temp:  [99.8 F (37.7 C)-102.7 F (39.3 C)] 102.3 F (39.1 C) (12/13 0645) Pulse Rate:  [88-109] 109  (12/13 1034) Resp:  [21-29] 26  (12/13 1034) BP: (105-142)/(50-70) 142/64 mmHg (12/13 1034) SpO2:  [96 %-100 %] 96 % (12/13 1034) Arterial Line BP: (103-134)/(49-67) 122/56 mmHg (12/13 1000) FiO2 (%):  [50 %-60.3 %] 50 % (12/13 1034) Weight:  [117.6 kg (259 lb 4.2 oz)] 259 lb 4.2 oz (117.6 kg) (12/13 0400)  Intake/Output from previous day: 12/12 0701 - 12/13 0700 In: 4215.7 [I.V.:2635.7; Blood:350; NG/GT:880; IV Piggyback:350] Out: 2410 [Urine:2410] Intake/Output this shift: Total I/O In: 404 [I.V.:314; NG/GT:90] Out: 380 [Urine:380]  Opens eyes to name. Follows a few simple commands  Lab Results:  Basename 07/06/11 0430 07/05/11 0445  WBC 11.4* 11.9*  HGB 7.1* 6.8*  HCT 21.9* 21.5*  PLT 199 187   BMET  Basename 07/06/11 0430 07/05/11 0445  NA 142 142  K 3.8 3.9  CL 109 108  CO2 26 27  GLUCOSE 241* 214*  BUN 17 16  CREATININE 0.42* 0.46*  CALCIUM 7.1* 7.1*    Studies/Results: Ct Head Wo Contrast  07/05/2011  *RADIOLOGY REPORT*  Clinical Data: Follow-up head injury.  CT HEAD WITHOUT CONTRAST  Technique:  Contiguous axial images were obtained from the base of the skull through the vertex without contrast.  Comparison: 07/02/2011.  Findings: Right parietal and posterior left frontal - parietal subarachnoid hemorrhage once again noted without significant change.  No new area of intracranial hemorrhage detected.  The scalp soft tissue swelling more notable on the left with no evidence of underlying skull fracture.  The partial opacification of the left mastoid air cells do not appear to be associated with a fracture.  Mild mucosal thickening sphenoid sinus air cells and ethmoid  sinus air cells.  The patient is intubated.  No CT evidence of large acute infarct.  Small acute infarct cannot be excluded by CT. No intracranial mass lesion detected on this unenhanced exam.  Exophthalmos.  Prominent symmetric superior ophthalmic veins of questionable significance.  IMPRESSION: No significant change in small amount of subarachnoid blood in the right parietal region and posterior left frontal - parietal region.  Original Report Authenticated By: Fuller Canada, M.D.   Dg Chest Port 1 View  07/06/2011  *RADIOLOGY REPORT*  Clinical Data: ARDS pattern  PORTABLE CHEST - 1 VIEW  Comparison: Chest radiograph 07/05/2011  Findings: Endotracheal tube and NG tube are unchanged.  Right PICC line noted.  Stable cardiac silhouette.  There is a diffuse air space disease which is slightly improved compared to prior.  No pneumothorax.  IMPRESSION: Slight improvement in diffuse severe air space disease.  Stable support apparatus.  Original Report Authenticated By: Genevive Bi, M.D.   Dg Chest Port 1 View  07/05/2011  *RADIOLOGY REPORT*  Clinical Data: Check endotracheal tube  PORTABLE CHEST - 1 VIEW  Comparison: 07/04/2011  Findings: Endotracheal tube is unchanged in position.  Cardiomegaly again noted.  Diffuse bilateral airspace disease again noted with slight worsening in aeration.  Right arm PICC line with tip in SVC right atrium junction.  NG tube in place.  IMPRESSION:  Cardiomegaly again noted.  Diffuse bilateral airspace disease again noted with slight worsening in aeration.  Right arm PICC line with tip in SVC right atrium  junction.  NG tube in place. Stable endotracheal tube position.  Original Report Authenticated By: Natasha Mead, M.D.    Assessment/Plan: Stable from neurosurgery stand point. Her biggest issues are orthopedic and pulmonary. Present rx per trauma.  LOS: 6 days  As above   [name], MD 07/06/2011, 10:47 AM

## 2011-07-06 NOTE — Consult Note (Signed)
I have seen and examined the patient. I agree with the findings above.  [name] H 07/06/2011 11:03 AM

## 2011-07-07 ENCOUNTER — Inpatient Hospital Stay (HOSPITAL_COMMUNITY): Payer: No Typology Code available for payment source

## 2011-07-07 LAB — BLOOD GAS, ARTERIAL
MECHVT: 550 mL
RATE: 18 resp/min
TCO2: 29.4 mmol/L (ref 0–100)
pCO2 arterial: 55.3 mmHg — ABNORMAL HIGH (ref 35.0–45.0)
pH, Arterial: 7.331 — ABNORMAL LOW (ref 7.350–7.400)

## 2011-07-07 LAB — BASIC METABOLIC PANEL
BUN: 20 mg/dL (ref 6–23)
Chloride: 107 mEq/L (ref 96–112)
Creatinine, Ser: 0.49 mg/dL — ABNORMAL LOW (ref 0.50–1.10)
GFR calc Af Amer: >90 mL/min (ref >90)

## 2011-07-07 LAB — CBC
Hemoglobin: 8.1 g/dL — ABNORMAL LOW (ref 12.0–15.0)
Platelets: 233 10*3/uL (ref 150–400)
RBC: 2.72 MIL/uL — ABNORMAL LOW (ref 3.87–5.11)
WBC: 12.5 10*3/uL — ABNORMAL HIGH (ref 4.0–10.5)

## 2011-07-07 LAB — GLUCOSE, CAPILLARY
Glucose-Capillary: 125 mg/dL — ABNORMAL HIGH (ref 70–99)
Glucose-Capillary: 131 mg/dL — ABNORMAL HIGH (ref 70–99)
Glucose-Capillary: 144 mg/dL — ABNORMAL HIGH (ref 70–99)
Glucose-Capillary: 155 mg/dL — ABNORMAL HIGH (ref 70–99)
Glucose-Capillary: 163 mg/dL — ABNORMAL HIGH (ref 70–99)
Glucose-Capillary: 165 mg/dL — ABNORMAL HIGH (ref 70–99)
Glucose-Capillary: 168 mg/dL — ABNORMAL HIGH (ref 70–99)
Glucose-Capillary: 181 mg/dL — ABNORMAL HIGH (ref 70–99)
Glucose-Capillary: 186 mg/dL — ABNORMAL HIGH (ref 70–99)
Glucose-Capillary: 187 mg/dL — ABNORMAL HIGH (ref 70–99)
Glucose-Capillary: 187 mg/dL — ABNORMAL HIGH (ref 70–99)
Glucose-Capillary: 238 mg/dL — ABNORMAL HIGH (ref 70–99)
Glucose-Capillary: 238 mg/dL — ABNORMAL HIGH (ref 70–99)
Glucose-Capillary: 264 mg/dL — ABNORMAL HIGH (ref 70–99)
Glucose-Capillary: 292 mg/dL — ABNORMAL HIGH (ref 70–99)

## 2011-07-07 LAB — DIFFERENTIAL
Basophils Relative: 0 % (ref 0–1)
Eosinophils Absolute: 0.1 10*3/uL (ref 0.0–0.7)
Eosinophils Relative: 1 % (ref 0–5)
Lymphocytes Relative: 19 % (ref 12–46)
Neutrophils Relative %: 75 % (ref 43–77)

## 2011-07-07 LAB — TYPE AND SCREEN
ABO/RH(D): A POS
Antibody Screen: NEGATIVE
Unit division: 0

## 2011-07-07 MED ORDER — GLUCERNA 1.2 CAL PO LIQD
1000.0000 mL | ORAL | Status: DC
Start: 2011-07-07 — End: 2011-07-07
  Administered 2011-07-07: 1000 mL via ORAL
  Filled 2011-07-07: qty 1000

## 2011-07-07 MED ORDER — GLUCERNA 1.2 CAL PO LIQD
1000.0000 mL | ORAL | Status: DC
  Administered 2011-07-10 – 2011-07-14 (×3): 1000 mL
  Filled 2011-07-07 (×13): qty 1000

## 2011-07-07 MED ORDER — FUROSEMIDE 10 MG/ML IJ SOLN
20.0000 mg | Freq: Two times a day (BID) | INTRAMUSCULAR | Status: AC
  Administered 2011-07-07 – 2011-07-09 (×5): 20 mg via INTRAVENOUS
  Filled 2011-07-07 (×6): qty 2

## 2011-07-07 MED ORDER — PRO-STAT SUGAR FREE PO LIQD
30.0000 mL | Freq: Three times a day (TID) | ORAL | Status: DC
  Administered 2011-07-07 – 2011-07-15 (×19): 30 mL
  Filled 2011-07-07 (×31): qty 30

## 2011-07-07 MED ORDER — FUROSEMIDE 10 MG/ML IJ SOLN
40.0000 mg | Freq: Once | INTRAMUSCULAR | Status: AC
  Administered 2011-07-07: 40 mg via INTRAVENOUS
  Filled 2011-07-07: qty 4

## 2011-07-07 NOTE — Progress Notes (Signed)
Nutrition Follow-up  Patient remains intubated.   Diet Order:  NPO TF: Glucerna 1.2 at 50 ml/hr with ProStat QID, providing 1728 kcal, 132 g protein, 966 ml free water.   TF formula changed due to hyperglycemia.   Meds: Scheduled Meds:   . antiseptic oral rinse  15 mL Mouth Rinse QID  . benazepril  5 mg Per NG tube Daily  . chlorhexidine  15 mL Mouth Rinse BID  . feeding supplement  30 mL Per Tube QID  . furosemide  20 mg Intravenous Q12H  . furosemide  40 mg Intravenous Once  . furosemide  40 mg Intravenous Once  . levalbuterol  0.63 mg Nebulization Q8H  . levofloxacin (LEVAQUIN) IV  750 mg Intravenous Q24H  . metoprolol tartrate  25 mg Per NG tube Daily  . pantoprazole sodium  40 mg Per Tube Q1200  . selenium  200 mcg Per Tube Daily  . sodium chloride  10 mL Intracatheter Q12H  . vancomycin  1,000 mg Intravenous Q12H  . vitamin C  1,000 mg Per Tube Q8H  . vitamin e  400 Units Per Tube TID  . DISCONTD: furosemide  20 mg Intravenous Once  . DISCONTD: insulin aspart  0-3 Units Subcutaneous Q4H  . DISCONTD: insulin glargine  10 Units Subcutaneous QHS   Continuous Infusions:   . 0.9 % NaCl with KCl 20 mEq / L 50 mL/hr at 07/07/11 1300  . feeding supplement (GLUCERNA 1.2 CAL) 1,000 mL (07/07/11 1100)  . fentaNYL infusion INTRAVENOUS 100 mcg/hr (07/07/11 1300)  . insulin (NOVOLIN-R) infusion 10.5 Units/hr (07/07/11 1300)  . midazolam (VERSED) infusion 2 mg/hr (07/07/11 1300)  . DISCONTD: feeding supplement (PIVOT 1.5 CAL) 1,000 mL (07/06/11 1618)   PRN Meds:.acetaminophen (TYLENOL) oral liquid 160 mg/5 mL, fentaNYL, midazolam, ondansetron (ZOFRAN) IV, ondansetron, sodium chloride  Labs:  CMP     Component Value Date/Time   NA 142 07/07/2011 0340   K 3.7 07/07/2011 0340   CL 107 07/07/2011 0340   CO2 27 07/07/2011 0340   GLUCOSE 252* 07/07/2011 0340   BUN 20 07/07/2011 0340   CREATININE 0.49* 07/07/2011 0340   CALCIUM 7.2* 07/07/2011 0340   PROT 6.1 06/30/2011 2040   ALBUMIN 3.5 06/30/2011 2040   AST 223* 06/30/2011 2040   ALT 209* 06/30/2011 2040   ALKPHOS 46 06/30/2011 2040   BILITOT 0.2* 06/30/2011 2040   GFRNONAA >90 07/07/2011 0340   GFRAA >90 07/07/2011 0340   CBG (last 3)   Basename 07/07/11 1144 07/07/11 1040 07/07/11 0936  GLUCAP 163* 187* 187*     Intake/Output Summary (Last 24 hours) at 07/07/11 1503 Last data filed at 07/07/11 1300  Gross per 24 hour  Intake 3125.16 ml  Output   5386 ml  Net -2260.84 ml   Re-estimated nutrition needs: 2294 kcal 110-130 g protein  Weight Status:  116.6 kg, up from 104.5 kg on 12/10  Nutrition Dx:  Inadequate oral intake, ongoing  Goal:  TF to meet 60-70% of estimated calorie needs and 100% of estimated protein needs, current TF providing 75% kcal, and 123% protein needs.   Intervention:  Change TF to Glucerna 1.2 at 45 ml/hr with 30 ml ProStat TID, to provide 1512 kcal (66% kcal), 110 g protein (100% needs), 869 ml free water.   Note: when hyperglycemia resolved, suggest change TF back to Pivot 1.5 at 30 ml/h with 30 ml ProStat QID due to trauma. Both formulas (Glucerna and Pivot) will provide 124 g carbohydrates, which should not  have a substantial effect on blood glucose.   Monitor:  TF tolerance/adequacy, weight, labs   Seda Kronberg, [employer]

## 2011-07-07 NOTE — Progress Notes (Signed)
Patient currently on IV insulin gtt per ICU Hyperglycemia Protocol.  Patient not ready to transition yet, but left a treatment team sticky note for protocol once patient meets criteria to come off of insulin gtt.

## 2011-07-07 NOTE — Progress Notes (Signed)
Follow up - Critical Care Medicine Note  Patient Details:    [name] is an 45 y.o. female.  Lines/tubes : AIRWAYS 7.5 mm (Active)  Secured at (cm) 23 cm 07/02/2011 12:00 AM     Airway 7.5 mm (Active)  Secured at (cm) 24 cm 07/07/2011  4:30 AM  Measured From Lips 07/07/2011  4:30 AM  Secured Location Left 07/07/2011  4:30 AM  Secured By [employer] 07/07/2011  4:30 AM  Tube Holder Repositioned Yes 07/07/2011  4:30 AM  Cuff Pressure (cm H2O) 25 cm H2O 07/05/2011  7:50 AM  Site Condition Dry 07/06/2011  2:40 PM     PICC Triple Lumen 07/04/11 PICC Right Basilic (Active)  Site Assessment Clean;Dry;Intact 07/06/2011  8:00 PM  Lumen #1 Status Infusing;Flushed 07/06/2011  8:00 PM  Lumen #2 Status Flushed;Saline locked 07/06/2011  8:00 PM  Lumen #3 Status Flushed;Saline locked 07/06/2011  8:00 PM  Line Care Tubing changed 07/04/2011  4:00 PM  Dressing Type Transparent 07/06/2011  8:00 PM  Dressing Status Clean;Dry;Intact 07/06/2011  8:00 PM  Indication for Insertion or Continuance of Line Limited venous access - need for IV therapy >5 days (PICC only) 07/06/2011  8:00 PM     Arterial Line 07/04/11 Right Radial (Active)  Site Assessment Clean;Dry;Intact 07/06/2011  8:00 PM  Line Status Pulsatile blood flow 07/06/2011  8:00 PM  Art Line Waveform Appropriate 07/06/2011  8:00 PM  Art Line Interventions Zeroed and calibrated 07/06/2011  8:00 PM  Color/Movement/Sensation Capillary refill less than 3 sec 07/06/2011  8:00 PM  Dressing Type Transparent 07/06/2011  8:00 PM  Dressing Status Clean;Dry;Intact 07/06/2011  8:00 PM  Interventions Dressing reinforced 07/06/2011  8:00 AM     NG/OG Tube Orogastric Right mouth (Active)  Placement Verification Auscultation 07/07/2011  4:00 AM  Site Assessment Clean;Dry;Intact 07/07/2011  4:00 AM  Status Infusing tube feed 07/07/2011  4:00 AM  Drainage Appearance Tan 07/07/2011  4:00 AM  Gastric Residual 125 mL 07/07/2011  4:00 AM    Intake (mL) 50 mL 07/07/2011  6:00 AM     Urethral Catheter Non-latex 14 Fr. (Active)  Site Assessment Clean;Intact 07/06/2011  8:00 PM  Collection Container Standard drainage bag 07/06/2011  8:00 PM  Securement Method Leg strap 07/06/2011  8:00 PM  Urinary Catheter Interventions Unclamped 07/06/2011  8:00 PM  Indication for Insertion or Continuance of Catheter Prolonged immobilization 07/06/2011  8:00 PM  Output (mL) 125 mL 07/07/2011  6:00 AM    Microbiology/Sepsis markers: Results for orders placed during the hospital encounter of 06/30/11  MRSA PCR SCREENING     Status: Normal   Collection Time   07/01/11  1:57 AM      Component Value Range Status Comment   MRSA by PCR NEGATIVE  NEGATIVE  Final   URINE CULTURE     Status: Normal   Collection Time   07/03/11 10:43 AM      Component Value Range Status Comment   Specimen Description URINE, CATHETERIZED   Final    Special Requests NONE   Final    Setup Time [id number]   Final    Colony Count NO GROWTH   Final    Culture NO GROWTH   Final    Report Status 07/04/2011 FINAL   Final   CULTURE, BAL-QUANTITATIVE     Status: Normal   Collection Time   07/03/11 11:00 AM      Component Value Range Status Comment   Specimen Description BRONCHIAL ALVEOLAR LAVAGE  Final    Special Requests NONE   Final    Gram Stain     Final    Value: ABUNDANT WBC PRESENT, PREDOMINANTLY PMN     NO ORGANISMS SEEN   Colony Count 10,000 COLONIES/ML   Final    Culture Non-Pathogenic Oropharyngeal-type Flora Isolated.   Final    Report Status 07/06/2011 FINAL   Final   CULTURE, BLOOD (ROUTINE X 2)     Status: Normal (Preliminary result)   Collection Time   07/05/11 11:00 AM      Component Value Range Status Comment   Specimen Description BLOOD HAND LEFT   Final    Special Requests BOTTLES DRAWN AEROBIC ONLY 6.0CC    Final    Setup Time [id number]   Final    Culture     Final    Value:        BLOOD CULTURE RECEIVED NO GROWTH TO DATE CULTURE WILL  BE HELD FOR 5 DAYS BEFORE ISSUING A FINAL NEGATIVE REPORT   Report Status PENDING   Incomplete   CULTURE, BLOOD (ROUTINE X 2)     Status: Normal (Preliminary result)   Collection Time   07/05/11 11:15 AM      Component Value Range Status Comment   Specimen Description BLOOD HAND LEFT   Final    Special Requests BOTTLES DRAWN AEROBIC ONLY 6.0CC    Final    Setup Time [id number]   Final    Culture     Final    Value:        BLOOD CULTURE RECEIVED NO GROWTH TO DATE CULTURE WILL BE HELD FOR 5 DAYS BEFORE ISSUING A FINAL NEGATIVE REPORT   Report Status PENDING   Incomplete     Anti-infectives:  Anti-infectives     Start     Dose/Rate Route Frequency Ordered Stop   07/05/11 2330   vancomycin (VANCOCIN) IVPB 1000 mg/200 mL premix        1,000 mg 200 mL/hr over 60 Minutes Intravenous Every 12 hours 07/05/11 1055     07/05/11 1200   Levofloxacin (LEVAQUIN) IVPB 750 mg        750 mg 100 mL/hr over 90 Minutes Intravenous Every 24 hours 07/05/11 1101     07/05/11 1130   vancomycin (VANCOCIN) 2,000 mg in sodium chloride 0.9 % 500 mL IVPB        2,000 mg 250 mL/hr over 120 Minutes Intravenous  Once 07/05/11 1055 07/05/11 1457   07/04/11 1100   Levofloxacin (LEVAQUIN) IVPB 750 mg  Status:  Discontinued        750 mg 100 mL/hr over 90 Minutes Intravenous Every 24 hours 07/04/11 1049 07/05/11 1023   07/04/11 0800   vancomycin (VANCOCIN) IVPB 1000 mg/200 mL premix        1,000 mg 200 mL/hr over 60 Minutes Intravenous  Once 07/03/11 1140 07/04/11 0939   07/02/11 1400   clindamycin (CLEOCIN) IVPB 600 mg        600 mg 100 mL/hr over 30 Minutes Intravenous 3 times per day 07/02/11 1035 07/03/11 0715   07/02/11 0800   clindamycin (CLEOCIN) IVPB 600 mg        600 mg 100 mL/hr over 30 Minutes Intravenous To Surgery 07/02/11 0721 07/02/11 0750          Best Practice/Protocols:  VTE Prophylaxis: Mechanical GI Prophylaxis: Proton Pump Inhibitor Continous Sedation Hyperglycemia  (ICU)  Consults: Treatment Team:  Rolanda Lundborg Kritzer Liberty [name]  Rinaldo Ratel, MD    Events:  Subjective:    Overnight Issues: Hyperglycemia and fever overnight.  Objective:  Vital signs for last 24 hours: Temp:  [100.7 F (38.2 C)-103 F (39.4 C)] 100.7 F (38.2 C) (12/14 0600) Pulse Rate:  [90-119] 96  (12/14 0600) Resp:  [20-35] 22  (12/14 0600) BP: (109-152)/(50-77) 120/54 mmHg (12/14 0600) SpO2:  [95 %-100 %] 95 % (12/14 0600) Arterial Line BP: (87-143)/(44-71) 108/55 mmHg (12/14 0600) FiO2 (%):  [49.8 %-99.8 %] 50.2 % (12/14 0600) Weight:  [257 lb 0.9 oz (116.6 kg)] 257 lb 0.9 oz (116.6 kg) (12/14 0300)  Hemodynamic parameters for last 24 hours:    Intake/Output from previous day: 12/13 0701 - 12/14 0700 In: 3610.4 [I.V.:1597.9; Blood:312.5; NG/GT:1150; IV Piggyback:550] Out: 4071 [Urine:4070; Stool:1]  Intake/Output this shift: Total I/O In: 1593.9 [I.V.:783.9; NG/GT:610; IV Piggyback:200] Out: 1201 [Urine:1200; Stool:1]  Vent settings for last 24 hours: Vent Mode:  [-] PRVC FiO2 (%):  [49.8 %-99.8 %] 50.2 % Set Rate:  [18 bmp] 18 bmp Vt Set:  [550 mL] 550 mL PEEP:  [8 cmH20-10 cmH20] 8 cmH20 Plateau Pressure:  [28 cmH20-31 cmH20] 30 cmH20  Physical Exam:  General: Sedated Neuro: RASS -2 HEENT/Neck: Irritation and abrasion to chin area from previous C-collar Resp: rales bilaterally GI: Soft, tolerating tube feedings, but glucose is high Extremities: edema 2+  Results for orders placed during the hospital encounter of 06/30/11 (from the past 24 hour(s))  GLUCOSE, CAPILLARY     Status: Abnormal   Collection Time   07/06/11  7:59 AM      Component Value Range   Glucose-Capillary 238 (*) 70 - 99 (mg/dL)  GLUCOSE, CAPILLARY     Status: Abnormal   Collection Time   07/06/11 12:01 PM      Component Value Range   Glucose-Capillary 235 (*) 70 - 99 (mg/dL)  GLUCOSE, CAPILLARY     Status: Abnormal   Collection Time    07/06/11  3:41 PM      Component Value Range   Glucose-Capillary 244 (*) 70 - 99 (mg/dL)  GLUCOSE, CAPILLARY     Status: Abnormal   Collection Time   07/06/11  7:47 PM      Component Value Range   Glucose-Capillary 199 (*) 70 - 99 (mg/dL)  GLUCOSE, CAPILLARY     Status: Abnormal   Collection Time   07/06/11 11:37 PM      Component Value Range   Glucose-Capillary 264 (*) 70 - 99 (mg/dL)  GLUCOSE, CAPILLARY     Status: Abnormal   Collection Time   07/07/11  1:14 AM      Component Value Range   Glucose-Capillary 292 (*) 70 - 99 (mg/dL)  GLUCOSE, CAPILLARY     Status: Abnormal   Collection Time   07/07/11  2:16 AM      Component Value Range   Glucose-Capillary 278 (*) 70 - 99 (mg/dL)  GLUCOSE, CAPILLARY     Status: Abnormal   Collection Time   07/07/11  3:18 AM      Component Value Range   Glucose-Capillary 255 (*) 70 - 99 (mg/dL)  CBC     Status: Abnormal   Collection Time   07/07/11  3:40 AM      Component Value Range   WBC 12.5 (*) 4.0 - 10.5 (K/uL)   RBC 2.72 (*) 3.87 - 5.11 (MIL/uL)   Hemoglobin 8.1 (*) 12.0 - 15.0 (g/dL)   HCT 16.1 (*) 09.6 -  46.0 (%)   MCV 91.9  78.0 - 100.0 (fL)   MCH 29.8  26.0 - 34.0 (pg)   MCHC 32.4  30.0 - 36.0 (g/dL)   RDW 16.1 (*) 09.6 - 15.5 (%)   Platelets 233  150 - 400 (K/uL)  DIFFERENTIAL     Status: Abnormal   Collection Time   07/07/11  3:40 AM      Component Value Range   Neutrophils Relative 75  43 - 77 (%)   Lymphocytes Relative 19  12 - 46 (%)   Monocytes Relative 5  3 - 12 (%)   Eosinophils Relative 1  0 - 5 (%)   Basophils Relative 0  0 - 1 (%)   Neutro Abs 9.4 (*) 1.7 - 7.7 (K/uL)   Lymphs Abs 2.4  0.7 - 4.0 (K/uL)   Monocytes Absolute 0.6  0.1 - 1.0 (K/uL)   Eosinophils Absolute 0.1  0.0 - 0.7 (K/uL)   Basophils Absolute 0.0  0.0 - 0.1 (K/uL)   WBC Morphology TOXIC GRANULATION    BASIC METABOLIC PANEL     Status: Abnormal   Collection Time   07/07/11  3:40 AM      Component Value Range   Sodium 142  135 - 145  (mEq/L)   Potassium 3.7  3.5 - 5.1 (mEq/L)   Chloride 107  96 - 112 (mEq/L)   CO2 27  19 - 32 (mEq/L)   Glucose, Bld 252 (*) 70 - 99 (mg/dL)   BUN 20  6 - 23 (mg/dL)   Creatinine, Ser 0.45 (*) 0.50 - 1.10 (mg/dL)   Calcium 7.2 (*) 8.4 - 10.5 (mg/dL)   GFR calc non Af Amer >90  >90 (mL/min)   GFR calc Af Amer >90  >90 (mL/min)  BLOOD GAS, ARTERIAL     Status: Abnormal   Collection Time   07/07/11  3:55 AM      Component Value Range   FIO2 .50     Delivery systems VENTILATOR     Mode PRESSURE REGULATED VOLUME CONTROL     VT 550     Rate 18     Peep/cpap 8.0     pH, Arterial 7.331 (*) 7.350 - 7.400    pCO2 arterial 55.3 (*) 35.0 - 45.0 (mmHg)   pO2, Arterial 71.3 (*) 80.0 - 100.0 (mmHg)   Bicarbonate 27.8 (*) 20.0 - 24.0 (mEq/L)   TCO2 29.4  0 - 100 (mmol/L)   Acid-Base Excess 2.7 (*) 0.0 - 2.0 (mmol/L)   O2 Saturation 93.9     Patient temperature 101.2     Collection site A-LINE     Drawn by COLLECTED BY RT     Sample type ARTERIAL DRAW     Allens test (pass/fail) PASS  PASS   GLUCOSE, CAPILLARY     Status: Abnormal   Collection Time   07/07/11  4:17 AM      Component Value Range   Glucose-Capillary 238 (*) 70 - 99 (mg/dL)  GLUCOSE, CAPILLARY     Status: Abnormal   Collection Time   07/07/11  5:22 AM      Component Value Range   Glucose-Capillary 254 (*) 70 - 99 (mg/dL)  GLUCOSE, CAPILLARY     Status: Abnormal   Collection Time   07/07/11  6:22 AM      Component Value Range   Glucose-Capillary 220 (*) 70 - 99 (mg/dL)     Assessment/Plan:   NEURO  Altered Mental Status:  sedation  Plan: Continue sedation  PULM  Interstitial Lung Disease: ARDS   Plan: Continue to support  CARDIO  Sinus Tachycardia   Plan: No specific changes  RENAL  Urine output good   Plan: Lasix to be added  GI  toleratign tube feedings but needs to converted to Glucerna   Plan: convert to Clucerna  ID  Fevers of unknnown etiology, cultures have not been specific for site of  infection   Plan: Keep on antibiotic.  HEME  Anemia anemia of critical illness) Leukocytosis (neutrophilia)   Plan: Continue present management  ENDO Diabetes Mellitus (Type II) and Hyperglycemia (stress related and also related to tube feedings and infection)   Plan: Change to Glucerna formula and keep on insulin drip  Global Issues  Hyperglycemia, fevers, sedation, VDRF    LOS: 7 days   Additional comments:I reviewed the patient's new clinical lab test results. CBC/Bmet and I reviewed the patients new imaging test results. CXR  Critical Care Total Time*: 30 Minutes  Cesar Rogerson III,Jezreel Sisk O 07/07/2011  *Care during the described time interval was provided by me and/or other providers on the critical care team.  I have reviewed this patient's available data, including medical history, events of note, physical examination and test results as part of my evaluation.
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
## 2011-07-08 ENCOUNTER — Inpatient Hospital Stay (HOSPITAL_COMMUNITY): Payer: No Typology Code available for payment source

## 2011-07-08 LAB — BLOOD GAS, ARTERIAL
Acid-Base Excess: 5.3 mmol/L — ABNORMAL HIGH (ref 0.0–2.0)
Bicarbonate: 29.3 mEq/L — ABNORMAL HIGH (ref 20.0–24.0)
Drawn by: 347641
FIO2: 0.5 %
RATE: 18 resp/min
pCO2 arterial: 45.8 mmHg — ABNORMAL HIGH (ref 35.0–45.0)
pO2, Arterial: 110 mmHg — ABNORMAL HIGH (ref 80.0–100.0)

## 2011-07-08 LAB — BASIC METABOLIC PANEL
BUN: 18 mg/dL (ref 6–23)
Calcium: 7.1 mg/dL — ABNORMAL LOW (ref 8.4–10.5)
Creatinine, Ser: 0.4 mg/dL — ABNORMAL LOW (ref 0.50–1.10)
GFR calc Af Amer: >90 mL/min (ref >90)
GFR calc non Af Amer: >90 mL/min (ref >90)

## 2011-07-08 LAB — CBC
HCT: 23.2 % — ABNORMAL LOW (ref 36.0–46.0)
MCHC: 31.9 g/dL (ref 30.0–36.0)
Platelets: 241 10*3/uL (ref 150–400)
RDW: 15.1 % (ref 11.5–15.5)
WBC: 14.2 10*3/uL — ABNORMAL HIGH (ref 4.0–10.5)

## 2011-07-08 LAB — GLUCOSE, CAPILLARY
Glucose-Capillary: 159 mg/dL — ABNORMAL HIGH (ref 70–99)
Glucose-Capillary: 170 mg/dL — ABNORMAL HIGH (ref 70–99)
Glucose-Capillary: 147 mg/dL — ABNORMAL HIGH (ref 70–99)
Glucose-Capillary: 154 mg/dL — ABNORMAL HIGH (ref 70–99)

## 2011-07-08 LAB — DIFFERENTIAL
Basophils Absolute: 0 10*3/uL (ref 0.0–0.1)
Eosinophils Absolute: 0.4 10*3/uL (ref 0.0–0.7)
Lymphocytes Relative: 16 % (ref 12–46)
Monocytes Relative: 6 % (ref 3–12)
Neutro Abs: 10.6 10*3/uL — ABNORMAL HIGH (ref 1.7–7.7)
Neutrophils Relative %: 75 % (ref 43–77)

## 2011-07-08 MED ORDER — DEXTROSE 10 % IV SOLN: INTRAVENOUS | Status: DC

## 2011-07-08 MED ORDER — INSULIN ASPART 100 UNIT/ML ~~LOC~~ SOLN
0.0000 [IU] | SUBCUTANEOUS | Status: DC
  Administered 2011-07-08 – 2011-07-09 (×2): 1 [IU] via SUBCUTANEOUS
  Administered 2011-07-09 (×3): 3 [IU] via SUBCUTANEOUS
  Administered 2011-07-09 – 2011-07-10 (×5): 1 [IU] via SUBCUTANEOUS
  Administered 2011-07-10: 3 [IU] via SUBCUTANEOUS
  Filled 2011-07-08: qty 3

## 2011-07-08 MED ORDER — FERROUS SULFATE 220 (44 FE) MG/5ML PO ELIX: 220.0000 mg | ORAL_SOLUTION | Freq: Three times a day (TID) | ORAL | Status: DC

## 2011-07-08 MED ORDER — FERROUS SULFATE 300 (60 FE) MG/5ML PO SYRP
300.0000 mg | ORAL_SOLUTION | Freq: Three times a day (TID) | ORAL | Status: DC
  Administered 2011-07-08 – 2011-07-17 (×22): 300 mg
  Filled 2011-07-08 (×32): qty 5

## 2011-07-08 MED ORDER — IOHEXOL 300 MG/ML  SOLN
100.0000 mL | Freq: Once | INTRAMUSCULAR | Status: AC | PRN
  Administered 2011-07-08: 40 mL via INTRAVENOUS
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
## 2011-07-08 NOTE — Progress Notes (Signed)
I have seen and examined the patient and agree with the assessment and plans  

## 2011-07-08 NOTE — Procedures (Signed)
Successful placement of an infrarenal IVC filter.  No immediate complications.  
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
## 2011-07-09 ENCOUNTER — Inpatient Hospital Stay (HOSPITAL_COMMUNITY): Payer: No Typology Code available for payment source

## 2011-07-09 LAB — BLOOD GAS, ARTERIAL
Acid-Base Excess: 3.2 mmol/L — ABNORMAL HIGH (ref 0.0–2.0)
FIO2: 0.4 %
MECHVT: 550 mL
Patient temperature: 100
RATE: 18 resp/min
TCO2: 28.7 mmol/L (ref 0–100)

## 2011-07-09 LAB — CBC
HCT: 28.1 % — ABNORMAL LOW (ref 36.0–46.0)
Platelets: 314 10*3/uL (ref 150–400)
RBC: 3.04 MIL/uL — ABNORMAL LOW (ref 3.87–5.11)
RDW: 14.7 % (ref 11.5–15.5)
WBC: 15.1 10*3/uL — ABNORMAL HIGH (ref 4.0–10.5)

## 2011-07-09 LAB — BASIC METABOLIC PANEL
BUN: 19 mg/dL (ref 6–23)
CO2: 27 mEq/L (ref 19–32)
Chloride: 105 mEq/L (ref 96–112)
GFR calc Af Amer: >90 mL/min (ref >90)
Potassium: 3.7 mEq/L (ref 3.5–5.1)

## 2011-07-09 LAB — GLUCOSE, CAPILLARY
Glucose-Capillary: 136 mg/dL — ABNORMAL HIGH (ref 70–99)
Glucose-Capillary: 135 mg/dL — ABNORMAL HIGH (ref 70–99)
Glucose-Capillary: 156 mg/dL — ABNORMAL HIGH (ref 70–99)
Glucose-Capillary: 177 mg/dL — ABNORMAL HIGH (ref 70–99)
Glucose-Capillary: 197 mg/dL — ABNORMAL HIGH (ref 70–99)
Glucose-Capillary: 192 mg/dL — ABNORMAL HIGH (ref 70–99)
Glucose-Capillary: 217 mg/dL — ABNORMAL HIGH (ref 70–99)
Glucose-Capillary: 182 mg/dL — ABNORMAL HIGH (ref 70–99)
Glucose-Capillary: 187 mg/dL — ABNORMAL HIGH (ref 70–99)
Glucose-Capillary: 174 mg/dL — ABNORMAL HIGH (ref 70–99)
Glucose-Capillary: 227 mg/dL — ABNORMAL HIGH (ref 70–99)

## 2011-07-09 LAB — URINE CULTURE
Colony Count: NO GROWTH
Culture: NO GROWTH
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
## 2011-07-09 NOTE — Progress Notes (Signed)
Seen, agree with above.   Still needs some ortho surgeries this week. Filter successfully placed yesterday.

## 2011-07-10 ENCOUNTER — Inpatient Hospital Stay (HOSPITAL_COMMUNITY): Payer: No Typology Code available for payment source

## 2011-07-10 DIAGNOSIS — J95821 Acute postprocedural respiratory failure: Secondary | ICD-10-CM | POA: Diagnosis not present

## 2011-07-10 LAB — CBC
MCH: 29.3 pg (ref 26.0–34.0)
MCHC: 31.6 g/dL (ref 30.0–36.0)
Platelets: 347 10*3/uL (ref 150–400)
RBC: 2.87 MIL/uL — ABNORMAL LOW (ref 3.87–5.11)

## 2011-07-10 LAB — BASIC METABOLIC PANEL
Calcium: 8.6 mg/dL (ref 8.4–10.5)
GFR calc Af Amer: >90 mL/min (ref >90)
GFR calc non Af Amer: >90 mL/min (ref >90)
Potassium: 3.7 mEq/L (ref 3.5–5.1)
Sodium: 140 mEq/L (ref 135–145)

## 2011-07-10 LAB — BLOOD GAS, ARTERIAL
Acid-Base Excess: 5 mmol/L — ABNORMAL HIGH (ref 0.0–2.0)
Bicarbonate: 29.2 mEq/L — ABNORMAL HIGH (ref 20.0–24.0)
FIO2: 0.3 %
O2 Saturation: 92.4 %
TCO2: 30.5 mmol/L (ref 0–100)
pO2, Arterial: 61.4 mmHg — ABNORMAL LOW (ref 80.0–100.0)

## 2011-07-10 LAB — VANCOMYCIN, TROUGH: Vancomycin Tr: <5 ug/mL — ABNORMAL LOW (ref 10.0–20.0)

## 2011-07-10 LAB — GLUCOSE, CAPILLARY
Glucose-Capillary: 197 mg/dL — ABNORMAL HIGH (ref 70–99)
Glucose-Capillary: 198 mg/dL — ABNORMAL HIGH (ref 70–99)

## 2011-07-10 MED ORDER — VANCOMYCIN HCL IN DEXTROSE 1-5 GM/200ML-% IV SOLN
1000.0000 mg | Freq: Three times a day (TID) | INTRAVENOUS | Status: DC
  Administered 2011-07-10 – 2011-07-11 (×3): 1000 mg via INTRAVENOUS
  Filled 2011-07-10 (×5): qty 200

## 2011-07-10 MED ORDER — INSULIN ASPART 100 UNIT/ML ~~LOC~~ SOLN
0.0000 [IU] | SUBCUTANEOUS | Status: DC
  Administered 2011-07-10 – 2011-07-11 (×3): 6 [IU] via SUBCUTANEOUS
  Administered 2011-07-11: 3 [IU] via SUBCUTANEOUS
  Administered 2011-07-11 (×2): 6 [IU] via SUBCUTANEOUS
  Administered 2011-07-11: 3 [IU] via SUBCUTANEOUS
  Administered 2011-07-11: 10 [IU] via SUBCUTANEOUS
  Administered 2011-07-12 (×2): 6 [IU] via SUBCUTANEOUS
  Administered 2011-07-12: 10 [IU] via SUBCUTANEOUS
  Administered 2011-07-12 (×2): 6 [IU] via SUBCUTANEOUS
  Administered 2011-07-12: 10 [IU] via SUBCUTANEOUS
  Administered 2011-07-13 (×2): 6 [IU] via SUBCUTANEOUS
  Administered 2011-07-13 (×2): 3 [IU] via SUBCUTANEOUS
  Administered 2011-07-14: 6 [IU] via SUBCUTANEOUS
  Administered 2011-07-14 (×2): 3 [IU] via SUBCUTANEOUS
  Administered 2011-07-14 (×2): 6 [IU] via SUBCUTANEOUS
  Administered 2011-07-14: 3 [IU] via SUBCUTANEOUS
  Administered 2011-07-15: 6 [IU] via SUBCUTANEOUS
  Administered 2011-07-15: 3 [IU] via SUBCUTANEOUS
  Administered 2011-07-15 (×3): 6 [IU] via SUBCUTANEOUS
  Administered 2011-07-16 (×6): 3 [IU] via SUBCUTANEOUS
  Administered 2011-07-17: 10 [IU] via SUBCUTANEOUS
  Administered 2011-07-17 (×2): 3 [IU] via SUBCUTANEOUS
  Filled 2011-07-10: qty 3

## 2011-07-10 MED ORDER — WHITE PETROLATUM GEL
Status: AC
  Administered 2011-07-10: 11:00:00
  Filled 2011-07-10: qty 5

## 2011-07-10 MED ORDER — INSULIN GLARGINE 100 UNIT/ML ~~LOC~~ SOLN
10.0000 [IU] | SUBCUTANEOUS | Status: DC
  Administered 2011-07-10 – 2011-07-11 (×2): 10 [IU] via SUBCUTANEOUS
  Filled 2011-07-10: qty 3
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
## 2011-07-10 NOTE — Progress Notes (Signed)
Patient's ETT holder replaced by Gardiner Coins, RRT, and Carrington Clamp, RRT.
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
## 2011-07-10 NOTE — Progress Notes (Signed)
Inpatient Diabetes Program Recommendations  AACE/ADA: New Consensus Statement on Inpatient Glycemic Control (2009)  Target Ranges:  Prepandial:   less than 140 mg/dL      Peak postprandial:   less than 180 mg/dL (1-2 hours)      Critically ill patients:  140 - 180 mg/dL   Reason for Visit: Elevated glucose:  205 mg/dL  Inpatient Diabetes Program Recommendations Insulin - Basal: Add Lantus 20 units daily:   104 kg8.2= 20.8 units of basal
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
## 2011-07-11 ENCOUNTER — Inpatient Hospital Stay (HOSPITAL_COMMUNITY): Payer: No Typology Code available for payment source

## 2011-07-11 LAB — CBC
HCT: 28.6 % — ABNORMAL LOW (ref 36.0–46.0)
Hemoglobin: 9.2 g/dL — ABNORMAL LOW (ref 12.0–15.0)
MCV: 92 fL (ref 78.0–100.0)
RDW: 14.1 % (ref 11.5–15.5)
WBC: 19.4 10*3/uL — ABNORMAL HIGH (ref 4.0–10.5)

## 2011-07-11 LAB — GLUCOSE, CAPILLARY
Glucose-Capillary: 136 mg/dL — ABNORMAL HIGH (ref 70–99)
Glucose-Capillary: 163 mg/dL — ABNORMAL HIGH (ref 70–99)

## 2011-07-11 LAB — CULTURE, BLOOD (ROUTINE X 2)
Culture  Setup Time: 201212121714
Culture: NO GROWTH
Culture: NO GROWTH

## 2011-07-11 LAB — BASIC METABOLIC PANEL
BUN: 14 mg/dL (ref 6–23)
Chloride: 98 mEq/L (ref 96–112)
Creatinine, Ser: 0.33 mg/dL — ABNORMAL LOW (ref 0.50–1.10)
GFR calc Af Amer: >90 mL/min (ref >90)
Glucose, Bld: 148 mg/dL — ABNORMAL HIGH (ref 70–99)

## 2011-07-11 LAB — CULTURE, RESPIRATORY W GRAM STAIN

## 2011-07-11 LAB — VANCOMYCIN, TROUGH: Vancomycin Tr: 9.8 ug/mL — ABNORMAL LOW (ref 10.0–20.0)

## 2011-07-11 MED ORDER — VANCOMYCIN HCL IN DEXTROSE 1-5 GM/200ML-% IV SOLN
1000.0000 mg | Freq: Three times a day (TID) | INTRAVENOUS | Status: DC
  Administered 2011-07-11 – 2011-07-12 (×2): 1000 mg via INTRAVENOUS
  Filled 2011-07-11 (×4): qty 200

## 2011-07-11 MED ORDER — METRONIDAZOLE IN NACL 5-0.79 MG/ML-% IV SOLN
500.0000 mg | Freq: Three times a day (TID) | INTRAVENOUS | Status: DC
  Administered 2011-07-11 – 2011-07-12 (×3): 500 mg via INTRAVENOUS
  Filled 2011-07-11 (×5): qty 100

## 2011-07-11 MED ORDER — VANCOMYCIN HCL 1000 MG IV SOLR
1250.0000 mg | Freq: Three times a day (TID) | INTRAVENOUS | Status: DC
  Filled 2011-07-11 (×2): qty 1250

## 2011-07-11 MED ORDER — FLUCONAZOLE IN SODIUM CHLORIDE 200-0.9 MG/100ML-% IV SOLN
200.0000 mg | INTRAVENOUS | Status: AC
  Administered 2011-07-11 – 2011-07-15 (×4): 200 mg via INTRAVENOUS
  Filled 2011-07-11 (×5): qty 100
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
## 2011-07-11 NOTE — Progress Notes (Signed)
R/o C.diff  Per documentation - pt has loose stools yesterday. Currently on vanc/levaquin for PNA? D8 of abx now. C.diff is definitely possible since she is on broad spectrum abx. D/w Dr. Janee Morn, will check for c.diff pcr and start empiric flagyl.    Plan  1. C.diff PCR 2. Flagyl 500mg  IV q8
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
## 2011-07-12 ENCOUNTER — Inpatient Hospital Stay (HOSPITAL_COMMUNITY): Payer: No Typology Code available for payment source

## 2011-07-12 DIAGNOSIS — Z01810 Encounter for preprocedural cardiovascular examination: Secondary | ICD-10-CM

## 2011-07-12 LAB — GLUCOSE, CAPILLARY
Glucose-Capillary: 174 mg/dL — ABNORMAL HIGH (ref 70–99)
Glucose-Capillary: 170 mg/dL — ABNORMAL HIGH (ref 70–99)

## 2011-07-12 LAB — CBC
Hemoglobin: 8.3 g/dL — ABNORMAL LOW (ref 12.0–15.0)
MCH: 29.2 pg (ref 26.0–34.0)
MCV: 93 fL (ref 78.0–100.0)
RBC: 2.84 MIL/uL — ABNORMAL LOW (ref 3.87–5.11)
WBC: 16.7 10*3/uL — ABNORMAL HIGH (ref 4.0–10.5)

## 2011-07-12 LAB — BASIC METABOLIC PANEL
CO2: 28 mEq/L (ref 19–32)
Calcium: 8 mg/dL — ABNORMAL LOW (ref 8.4–10.5)
Chloride: 101 mEq/L (ref 96–112)
Creatinine, Ser: 0.39 mg/dL — ABNORMAL LOW (ref 0.50–1.10)
Glucose, Bld: 226 mg/dL — ABNORMAL HIGH (ref 70–99)

## 2011-07-12 MED ORDER — INSULIN GLARGINE 100 UNIT/ML ~~LOC~~ SOLN
20.0000 [IU] | SUBCUTANEOUS | Status: DC
  Administered 2011-07-12 – 2011-07-15 (×4): 20 [IU] via SUBCUTANEOUS
  Filled 2011-07-12: qty 3

## 2011-07-12 NOTE — Progress Notes (Signed)
Nutrition Follow-up  Diet Order:  NPO; TF via OG tube:  Glucerna 1.2 at 45 ml/h with Prostat 30 ml TID providing 1512 kcals, 110 grams protein, 869 ml free water daily.  Meds: Scheduled Meds:   . antiseptic oral rinse  15 mL Mouth Rinse QID  . benazepril  5 mg Per NG tube Daily  . chlorhexidine  15 mL Mouth Rinse BID  . feeding supplement  30 mL Per Tube TID  . ferrous sulfate  300 mg Per Tube TID  . fluconazole (DIFLUCAN) IV  200 mg Intravenous Q24H  . insulin aspart  0-10 Units Subcutaneous Q4H  . insulin glargine  20 Units Subcutaneous Q24H  . levalbuterol  0.63 mg Nebulization Q8H  . metoprolol tartrate  25 mg Per NG tube Daily  . pantoprazole sodium  40 mg Per Tube Q1200  . sodium chloride  10 mL Intracatheter Q12H  . vitamin e  400 Units Per Tube TID  . DISCONTD: insulin glargine  10 Units Subcutaneous Q24H  . DISCONTD: levofloxacin (LEVAQUIN) IV  750 mg Intravenous Q24H  . DISCONTD: metronidazole  500 mg Intravenous Q8H  . DISCONTD: vancomycin  1,250 mg Intravenous Q8H  . DISCONTD: vancomycin  1,000 mg Intravenous Q8H  . DISCONTD: vancomycin  1,000 mg Intravenous Q8H   Continuous Infusions:   . 0.9 % NaCl with KCl 20 mEq / L 50 mL/hr at 07/12/11 1200  . dextrose    . feeding supplement (GLUCERNA 1.2 CAL) 1,000 mL (07/12/11 1131)  . fentaNYL infusion INTRAVENOUS 100 mcg/hr (07/12/11 1200)  . insulin (NOVOLIN-R) infusion Stopped (07/08/11 0807)  . midazolam (VERSED) infusion 6 mg/hr (07/12/11 1200)   PRN Meds:.acetaminophen (TYLENOL) oral liquid 160 mg/5 mL, fentaNYL, midazolam, ondansetron (ZOFRAN) IV, ondansetron, sodium chloride  Labs:  CMP     Component Value Date/Time   NA 137 07/12/2011 0400   K 3.9 07/12/2011 0400   CL 101 07/12/2011 0400   CO2 28 07/12/2011 0400   GLUCOSE 226* 07/12/2011 0400   BUN 14 07/12/2011 0400   CREATININE 0.39* 07/12/2011 0400   CALCIUM 8.0* 07/12/2011 0400   PROT 6.1 06/30/2011 2040   ALBUMIN 3.5 06/30/2011 2040   AST 223*  06/30/2011 2040   ALT 209* 06/30/2011 2040   ALKPHOS 46 06/30/2011 2040   BILITOT 0.2* 06/30/2011 2040   GFRNONAA >90 07/12/2011 0400   GFRAA >90 07/12/2011 0400   CBG (last 3)   Basename 07/12/11 1221 07/12/11 0816 07/12/11 0322  GLUCAP 164* 231* 227*     Intake/Output Summary (Last 24 hours) at 07/12/11 1350 Last data filed at 07/12/11 1200  Gross per 24 hour  Intake 2676.4 ml  Output   2275 ml  Net  401.4 ml    Weight Status:  113.3 kg down from 116.6 kg on 12/14  Re-estimated needs:  2115-2315 kcals, 110-130 grams protein daily  Nutrition Dx:  Inadequate oral intake, ongoing.  Goal: TF to meet 60-70% of estimated calorie needs and 100% of estimated protein needs, current TF providing 68% kcal, and 100% protein needs.   Intervention:  Continue current TF.  Monitor:  TF tolerance/adequacy, weight, labs.   Hettie Holstein Pager #:  [phone]
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
## 2011-07-12 NOTE — Progress Notes (Signed)
PRELIMINARY  PRELIMINARY  PRELIMINARY  PRELIMINARY  Bilateral lower extremity venous duplex completed.    Preliminary report:  Bilateral:  No obvious evidence of DVT, superficial thrombosis or Baker's Cyst.  Right calf not imaged due to bandage.    Terance Hart, RVT 07/12/2011 1:06 PM

## 2011-07-12 NOTE — Progress Notes (Signed)
Subjective: Intubated and sedated now. Communicated with family and patient last night re surgery cancellation late yesterday night and rescheduling for first case tomorrow.  Objective: Vital signs in last 24 hours: Temp:  [97.9 F (36.6 C)-100.4 F (38 C)] 100.3 F (37.9 C) (12/19 0323) Pulse Rate:  [77-101] 93  (12/19 0748) Resp:  [17-32] 20  (12/19 0748) BP: (96-125)/(46-70) 114/61 mmHg (12/19 0748) SpO2:  [95 %-100 %] 97 % (12/19 0748) FiO2 (%):  [30 %-35.2 %] 30 % (12/19 0748) Weight:  [113.3 kg (249 lb 12.5 oz)] 249 lb 12.5 oz (113.3 kg) (12/19 0500) Weight change: 0.3 kg (10.6 oz) Last BM Date: 07/09/11  Intake/Output from previous day: 12/18 0701 - 12/19 0700 In: 2301.4 [I.V.:626.4; NG/GT:675; IV Piggyback:1000] Out: 2525 [Urine:2525]  MSK: No change in examination; able to demonstrate clear and forceful DPN, SPN, TN motor and reported intact sensation all distributions last night while alert.  Lab Results:  Basename 07/12/11 0400 07/11/11 0400  WBC 16.7* 19.4*  HGB 8.3* 9.2*  HCT 26.4* 28.6*  PLT 380 377   BMET  Basename 07/12/11 0400 07/11/11 0400  NA 137 134*  K 3.9 4.2  CL 101 98  CO2 28 28  GLUCOSE 226* 148*  BUN 14 14  CREATININE 0.39* 0.33*  CALCIUM 8.0* 8.5    Studies/Results: Dg Chest Port 1 View  07/11/2011  *RADIOLOGY REPORT*  Clinical Data: ARDS.  Ventilator.  PORTABLE CHEST - 1 VIEW  Comparison: 07/10/2011  Findings: Support devices.  Bilateral perihilar and lower lobe airspace opacities may be slightly improved, particularly in the right base.  Mild cardiomegaly.  No visible effusions.  No acute bony abnormality.  IMPRESSION: Bilateral patchy airspace opacities, slightly improved in the right base.  Original Report Authenticated By: Cyndie Chime, M.D.    DVT prophylaxis: filter  Assessment/Plan: Plan for ORIF of L acetabulum tomorrow am with dressing changes.   LOS: 12 days   [name] H 07/12/2011, 8:08 AM

## 2011-07-12 NOTE — Progress Notes (Signed)
Add Lantus 20 units daily if CBGs continue greater than 180 mg/dl.  Smith Mince RN BSN

## 2011-07-12 NOTE — Progress Notes (Signed)
Follow up - Critical Care Medicine Note  Patient Details:    [name] is an 45 y.o. female.  Lines/tubes : AIRWAYS 7.5 mm (Active)  Secured at (cm) 23 cm 07/02/2011 12:00 AM     Airway 7.5 mm (Active)  Secured at (cm) 21 cm 07/12/2011  7:48 AM  Measured From Lips 07/12/2011  7:48 AM  Secured Location Right 07/12/2011  7:48 AM  Secured By [employer] 07/12/2011  7:48 AM  Tube Holder Repositioned Yes 07/12/2011  7:48 AM  Cuff Pressure (cm H2O) 22 cm H2O 07/12/2011  3:26 AM  Site Condition Dry 07/12/2011  7:48 AM     PICC Triple Lumen 07/04/11 PICC Right Basilic (Active)  Site Assessment Clean;Dry;Intact 07/11/2011  8:00 PM  Lumen #1 Status Infusing 07/11/2011  8:00 PM  Lumen #2 Status Saline locked 07/11/2011  8:00 PM  Lumen #3 Status Saline locked 07/11/2011  8:00 PM  Line Care Tubing changed 07/04/2011  4:00 PM  Dressing Type Transparent 07/11/2011  8:00 PM  Dressing Status Clean;Dry;Intact 07/11/2011  8:00 PM  Dressing Intervention Dressing changed 07/12/2011  5:00 AM  Dressing Change Due 07/19/11 07/12/2011  5:00 AM  Indication for Insertion or Continuance of Line Limited venous access - need for IV therapy >5 days (PICC only) 07/10/2011  9:00 AM     NG/OG Tube Orogastric Right mouth (Active)  Placement Verification Auscultation 07/12/2011  7:00 AM  Site Assessment Clean;Dry;Intact 07/12/2011  7:00 AM  Status Infusing tube feed 07/12/2011  7:00 AM  Drainage Appearance Tan 07/09/2011  8:00 PM  Gastric Residual 0 mL 07/12/2011  7:00 AM  Intake (mL) 45 mL 07/12/2011 10:00 AM     Urethral Catheter Non-latex 14 Fr. (Active)  Site Assessment Clean;Intact 07/12/2011  7:00 AM  Collection Container Standard drainage bag 07/12/2011  7:00 AM  Securement Method Leg strap 07/12/2011  7:00 AM  Urinary Catheter Interventions Unclamped 07/12/2011  7:00 AM  Indication for Insertion or Continuance of Catheter Urinary output monitoring 07/12/2011  7:00 AM  Output (mL) 350  mL 07/12/2011 10:00 AM    Microbiology/Sepsis markers: Results for orders placed during the hospital encounter of 06/30/11  MRSA PCR SCREENING     Status: Normal   Collection Time   07/01/11  1:57 AM      Component Value Range Status Comment   MRSA by PCR NEGATIVE  NEGATIVE  Final   URINE CULTURE     Status: Normal   Collection Time   07/03/11 10:43 AM      Component Value Range Status Comment   Specimen Description URINE, CATHETERIZED   Final    Special Requests NONE   Final    Setup Time [id number]   Final    Colony Count NO GROWTH   Final    Culture NO GROWTH   Final    Report Status 07/04/2011 FINAL   Final   CULTURE, BAL-QUANTITATIVE     Status: Normal   Collection Time   07/03/11 11:00 AM      Component Value Range Status Comment   Specimen Description BRONCHIAL ALVEOLAR LAVAGE   Final    Special Requests NONE   Final    Gram Stain     Final    Value: ABUNDANT WBC PRESENT, PREDOMINANTLY PMN     NO ORGANISMS SEEN   Colony Count 10,000 COLONIES/ML   Final    Culture Non-Pathogenic Oropharyngeal-type Flora Isolated.   Final    Report Status 07/06/2011 FINAL   Final  CULTURE, BLOOD (ROUTINE X 2)     Status: Normal   Collection Time   07/05/11 11:00 AM      Component Value Range Status Comment   Specimen Description BLOOD HAND LEFT   Final    Special Requests BOTTLES DRAWN AEROBIC ONLY 6.0CC    Final    Setup Time [id number]   Final    Culture NO GROWTH 5 DAYS   Final    Report Status 07/11/2011 FINAL   Final   CULTURE, BLOOD (ROUTINE X 2)     Status: Normal   Collection Time   07/05/11 11:15 AM      Component Value Range Status Comment   Specimen Description BLOOD HAND LEFT   Final    Special Requests BOTTLES DRAWN AEROBIC ONLY 6.0CC    Final    Setup Time [id number]   Final    Culture NO GROWTH 5 DAYS   Final    Report Status 07/11/2011 FINAL   Final   CULTURE, BLOOD (ROUTINE X 2)     Status: Normal (Preliminary result)   Collection Time   07/08/11 10:15  AM      Component Value Range Status Comment   Specimen Description BLOOD LEFT ARM   Final    Special Requests BOTTLES DRAWN AEROBIC ONLY 8CC   Final    Setup Time [id number]   Final    Culture     Final    Value:        BLOOD CULTURE RECEIVED NO GROWTH TO DATE CULTURE WILL BE HELD FOR 5 DAYS BEFORE ISSUING A FINAL NEGATIVE REPORT   Report Status PENDING   Incomplete   CULTURE, BLOOD (ROUTINE X 2)     Status: Normal (Preliminary result)   Collection Time   07/08/11 10:20 AM      Component Value Range Status Comment   Specimen Description BLOOD LEFT HAND   Final    Special Requests BOTTLES DRAWN AEROBIC ONLY 4CC   Final    Setup Time [id number]   Final    Culture     Final    Value:        BLOOD CULTURE RECEIVED NO GROWTH TO DATE CULTURE WILL BE HELD FOR 5 DAYS BEFORE ISSUING A FINAL NEGATIVE REPORT   Report Status PENDING   Incomplete   URINE CULTURE     Status: Normal   Collection Time   07/08/11  2:01 PM      Component Value Range Status Comment   Specimen Description URINE, CATHETERIZED   Final    Special Requests NONE   Final    Setup Time [id number]   Final    Colony Count NO GROWTH   Final    Culture NO GROWTH   Final    Report Status 07/09/2011 FINAL   Final   CULTURE, RESPIRATORY     Status: Normal   Collection Time   07/08/11  9:30 PM      Component Value Range Status Comment   Specimen Description ENDOTRACHEAL ASPIRATE   Final    Special Requests NONE   Final    Gram Stain     Final    Value: FEW WBC PRESENT,BOTH PMN AND MONONUCLEAR     FEW SQUAMOUS EPITHELIAL CELLS PRESENT     NO ORGANISMS SEEN   Culture FEW CANDIDA ALBICANS   Final    Report Status 07/11/2011 FINAL   Final     Anti-infectives:  Anti-infectives     Start  Dose/Rate Route Frequency Ordered Stop   07/11/11 2215   vancomycin (VANCOCIN) IVPB 1000 mg/200 mL premix        1,000 mg 200 mL/hr over 60 Minutes Intravenous 3 times per day 07/11/11 2210     07/11/11 2200   vancomycin  (VANCOCIN) 1,250 mg in sodium chloride 0.9 % 250 mL IVPB  Status:  Discontinued        1,250 mg 166.7 mL/hr over 90 Minutes Intravenous Every 8 hours 07/11/11 2200 07/11/11 2203   07/11/11 1400   metroNIDAZOLE (FLAGYL) IVPB 500 mg  Status:  Discontinued        500 mg 100 mL/hr over 60 Minutes Intravenous Every 8 hours 07/11/11 1323 07/12/11 1013   07/11/11 1000   fluconazole (DIFLUCAN) IVPB 200 mg        200 mg 100 mL/hr over 60 Minutes Intravenous Every 24 hours 07/11/11 0906 07/16/11 0959   07/10/11 2200   vancomycin (VANCOCIN) IVPB 1000 mg/200 mL premix  Status:  Discontinued        1,000 mg 200 mL/hr over 60 Minutes Intravenous 3 times per day 07/10/11 1236 07/11/11 2200   07/05/11 2330   vancomycin (VANCOCIN) IVPB 1000 mg/200 mL premix  Status:  Discontinued        1,000 mg 200 mL/hr over 60 Minutes Intravenous Every 12 hours 07/05/11 1055 07/10/11 1236   07/05/11 1200   Levofloxacin (LEVAQUIN) IVPB 750 mg        750 mg 100 mL/hr over 90 Minutes Intravenous Every 24 hours 07/05/11 1101     07/05/11 1130   vancomycin (VANCOCIN) 2,000 mg in sodium chloride 0.9 % 500 mL IVPB        2,000 mg 250 mL/hr over 120 Minutes Intravenous  Once 07/05/11 1055 07/05/11 1457   07/04/11 1100   Levofloxacin (LEVAQUIN) IVPB 750 mg  Status:  Discontinued        750 mg 100 mL/hr over 90 Minutes Intravenous Every 24 hours 07/04/11 1049 07/05/11 1023   07/04/11 0800   vancomycin (VANCOCIN) IVPB 1000 mg/200 mL premix        1,000 mg 200 mL/hr over 60 Minutes Intravenous  Once 07/03/11 1140 07/04/11 0939   07/02/11 1400   clindamycin (CLEOCIN) IVPB 600 mg        600 mg 100 mL/hr over 30 Minutes Intravenous 3 times per day 07/02/11 1035 07/03/11 0715   07/02/11 0800   clindamycin (CLEOCIN) IVPB 600 mg        600 mg 100 mL/hr over 30 Minutes Intravenous To Surgery 07/02/11 0721 07/02/11 0750          Best Practice/Protocols:  VTE Prophylaxis: Mechanical GI Prophylaxis: Proton Pump  Inhibitor No protocols being used.  Consults: Treatment Team:  Rolanda Lundborg Kritzer Liz Beach, MD    Events:  Subjective:    Overnight Issues: The patient's glucose has been out of control overnight.  Is now on Jevity 1.2, not Glucerna  Objective:  Vital signs for last 24 hours: Temp:  [97.9 F (36.6 C)-100.4 F (38 C)] 100 F (37.8 C) (12/19 0748) Pulse Rate:  [77-101] 101  (12/19 0900) Resp:  [18-32] 25  (12/19 0900) BP: (96-121)/(46-65) 121/65 mmHg (12/19 0900) SpO2:  [94 %-100 %] 94 % (12/19 0900) FiO2 (%):  [29.8 %-35.2 %] 29.8 % (12/19 0900) Weight:  [249 lb 12.5 oz (113.3 kg)] 249 lb 12.5 oz (113.3 kg) (12/19 0500)  Hemodynamic parameters for last  24 hours:    Intake/Output from previous day: 12/18 0701 - 12/19 0700 In: 2351.4 [I.V.:676.4; NG/GT:675; IV Piggyback:1000] Out: 2525 [Urine:2525]  Intake/Output this shift: Total I/O In: 235 [I.V.:100; NG/GT:135] Out: 625 [Urine:625]  Vent settings for last 24 hours: Vent Mode:  [-] PSV;CPAP FiO2 (%):  [29.8 %-35.2 %] 29.8 % Set Rate:  [18 bmp] 18 bmp Vt Set:  [550 mL] 550 mL PEEP:  [5 cmH20-5.1 cmH20] 5 cmH20 Pressure Support:  [8 cmH20] 8 cmH20 Plateau Pressure:  [23 cmH20-25 cmH20] 23 cmH20  Physical Exam:  General: alert and no respiratory distress Neuro: alert, oriented and nonfocal exam Resp: clear to auscultation bilaterally GI: soft, nontender, BS WNL, no r/g Extremities: no edema, no erythema, pulses WNL  Results for orders placed during the hospital encounter of 06/30/11 (from the past 24 hour(s))  TYPE AND SCREEN     Status: Normal   Collection Time   07/11/11 10:45 AM      Component Value Range   ABO/RH(D) A POS     Antibody Screen NEG     Sample Expiration 07/14/2011    GLUCOSE, CAPILLARY     Status: Abnormal   Collection Time   07/11/11 12:16 PM      Component Value Range   Glucose-Capillary 148 (*) 70 - 99 (mg/dL)  GLUCOSE, CAPILLARY      Status: Abnormal   Collection Time   07/11/11  3:28 PM      Component Value Range   Glucose-Capillary 171 (*) 70 - 99 (mg/dL)   Comment 1 Notify RN     Comment 2 Documented in Chart    GLUCOSE, CAPILLARY     Status: Abnormal   Collection Time   07/11/11  7:28 PM      Component Value Range   Glucose-Capillary 163 (*) 70 - 99 (mg/dL)  VANCOMYCIN, TROUGH     Status: Abnormal   Collection Time   07/11/11  8:55 PM      Component Value Range   Vancomycin Tr 9.8 (*) 10.0 - 20.0 (ug/mL)  GLUCOSE, CAPILLARY     Status: Abnormal   Collection Time   07/11/11 11:37 PM      Component Value Range   Glucose-Capillary 199 (*) 70 - 99 (mg/dL)  GLUCOSE, CAPILLARY     Status: Abnormal   Collection Time   07/12/11  3:22 AM      Component Value Range   Glucose-Capillary 227 (*) 70 - 99 (mg/dL)  CBC     Status: Abnormal   Collection Time   07/12/11  4:00 AM      Component Value Range   WBC 16.7 (*) 4.0 - 10.5 (K/uL)   RBC 2.84 (*) 3.87 - 5.11 (MIL/uL)   Hemoglobin 8.3 (*) 12.0 - 15.0 (g/dL)   HCT 16.1 (*) 09.6 - 46.0 (%)   MCV 93.0  78.0 - 100.0 (fL)   MCH 29.2  26.0 - 34.0 (pg)   MCHC 31.4  30.0 - 36.0 (g/dL)   RDW 04.5  40.9 - 81.1 (%)   Platelets 380  150 - 400 (K/uL)  BASIC METABOLIC PANEL     Status: Abnormal   Collection Time   07/12/11  4:00 AM      Component Value Range   Sodium 137  135 - 145 (mEq/L)   Potassium 3.9  3.5 - 5.1 (mEq/L)   Chloride 101  96 - 112 (mEq/L)   CO2 28  19 - 32 (mEq/L)   Glucose, Bld 226 (*)  70 - 99 (mg/dL)   BUN 14  6 - 23 (mg/dL)   Creatinine, Ser 4.09 (*) 0.50 - 1.10 (mg/dL)   Calcium 8.0 (*) 8.4 - 10.5 (mg/dL)   GFR calc non Af Amer >90  >90 (mL/min)   GFR calc Af Amer >90  >90 (mL/min)  GLUCOSE, CAPILLARY     Status: Abnormal   Collection Time   07/12/11  8:16 AM      Component Value Range   Glucose-Capillary 231 (*) 70 - 99 (mg/dL)     Assessment/Plan:   NEURO  Responsive currently   Plan: No changes  PULM  Clear to auscultation    Plan: No changes.   Will try to wean after she has surgery  CARDIO  No issues   Plan: changes  RENAL  UO good.   Plan: changes  GI  Tolerating tube feedings well, however the tube feedings had been changed to Jevity from Glucerna and her glucose had gone out of control   Plan: Switch back to Glucerna  ID  CXR much improved.  Only growth is Candida   Plan: No changes except will DC Vanco and Levo  HEME  Leukocytosis (neutrophilia)   Plan: CPM  ENDO Hyperglycemia (due to change in feeding formula)   Plan: Go back to Glucerna  Global Issues      LOS: 12 days   Additional comments:I reviewed the patient's new clinical lab test results. CBBC/Bmet and I reviewed the patients new imaging test results. CXR  Critical Care Total Time*: 30 Minutes  Hanni Milford III,Danette Weinfeld O 07/12/2011  *Care during the described time interval was provided by me and/or other providers on the critical care team.  I have reviewed this patient's available data, including medical history, events of note, physical examination and test results as part of my evaluation.

## 2011-07-12 NOTE — Progress Notes (Signed)
This patient has been seen and I agree with the findings and treatment plan.  Delancey Moraes O. Jayley Hustead, III, MD, FACS [phone] (pager) [phone] (direct pager) Trauma Surgeon  

## 2011-07-13 ENCOUNTER — Encounter (HOSPITAL_COMMUNITY): Payer: Self-pay | Admitting: Anesthesiology

## 2011-07-13 ENCOUNTER — Inpatient Hospital Stay (HOSPITAL_COMMUNITY): Payer: No Typology Code available for payment source | Admitting: Anesthesiology

## 2011-07-13 ENCOUNTER — Inpatient Hospital Stay (HOSPITAL_COMMUNITY): Payer: No Typology Code available for payment source

## 2011-07-13 ENCOUNTER — Encounter (HOSPITAL_COMMUNITY): Admission: EM | Disposition: A | Discharge status: Skilled Nursing Facility | Payer: Self-pay | Source: Home / Self Care

## 2011-07-13 HISTORY — PX: ORIF ACETABULAR FRACTURE: SHX5029

## 2011-07-13 LAB — CBC
MCH: 29.1 pg (ref 26.0–34.0)
Platelets: 446 10*3/uL — ABNORMAL HIGH (ref 150–400)
RBC: 3.02 MIL/uL — ABNORMAL LOW (ref 3.87–5.11)
WBC: 15.5 10*3/uL — ABNORMAL HIGH (ref 4.0–10.5)

## 2011-07-13 LAB — GLUCOSE, CAPILLARY
Glucose-Capillary: 154 mg/dL — ABNORMAL HIGH (ref 70–99)
Glucose-Capillary: 165 mg/dL — ABNORMAL HIGH (ref 70–99)
Glucose-Capillary: 142 mg/dL — ABNORMAL HIGH (ref 70–99)

## 2011-07-13 SURGERY — OPEN REDUCTION INTERNAL FIXATION (ORIF) ACETABULAR FRACTURE
Anesthesia: General | Site: Hip | Laterality: Left | Wound class: Clean

## 2011-07-13 MED ORDER — VANCOMYCIN HCL IN DEXTROSE 1-5 GM/200ML-% IV SOLN
INTRAVENOUS | Status: AC
  Filled 2011-07-13: qty 200

## 2011-07-13 MED ORDER — FENTANYL CITRATE 0.05 MG/ML IJ SOLN
INTRAMUSCULAR | Status: DC | PRN
  Administered 2011-07-13: 50 ug via INTRAVENOUS
  Administered 2011-07-13: 100 ug via INTRAVENOUS
  Administered 2011-07-13 (×2): 50 ug via INTRAVENOUS

## 2011-07-13 MED ORDER — VANCOMYCIN HCL 1000 MG IV SOLR
1000.0000 mg | INTRAVENOUS | Status: DC | PRN
  Administered 2011-07-13: 1000 mg via INTRAVENOUS

## 2011-07-13 MED ORDER — NYSTATIN 100000 UNIT/GM EX POWD
Freq: Two times a day (BID) | CUTANEOUS | Status: DC
  Administered 2011-07-13 – 2011-07-17 (×9): via TOPICAL
  Filled 2011-07-13 (×3): qty 15

## 2011-07-13 MED ORDER — SODIUM CHLORIDE 0.9 % IV SOLN
INTRAVENOUS | Status: DC | PRN
  Administered 2011-07-13: 10:00:00 via INTRAVENOUS

## 2011-07-13 MED ORDER — 0.9 % SODIUM CHLORIDE (POUR BTL) OPTIME
TOPICAL | Status: DC | PRN
  Administered 2011-07-13: 1000 mL

## 2011-07-13 MED ORDER — SODIUM CHLORIDE 0.9 % IV SOLN
50.0000 mg | INTRAVENOUS | Status: DC | PRN
  Administered 2011-07-13: 10:00:00 via INTRAVENOUS
  Administered 2011-07-13: 6 mg/h via INTRAVENOUS

## 2011-07-13 MED ORDER — PHENYLEPHRINE HCL 10 MG/ML IJ SOLN
10.0000 mg | INTRAVENOUS | Status: DC | PRN
  Administered 2011-07-13: 20 ug/min via INTRAVENOUS

## 2011-07-13 MED ORDER — ROCURONIUM BROMIDE 100 MG/10ML IV SOLN
INTRAVENOUS | Status: DC | PRN
  Administered 2011-07-13: 10 mg via INTRAVENOUS
  Administered 2011-07-13 (×2): 20 mg via INTRAVENOUS
  Administered 2011-07-13: 50 mg via INTRAVENOUS
  Administered 2011-07-13 (×3): 20 mg via INTRAVENOUS

## 2011-07-13 MED ORDER — PHENYLEPHRINE HCL 10 MG/ML IJ SOLN
INTRAMUSCULAR | Status: DC | PRN
  Administered 2011-07-13 (×3): 40 ug via INTRAVENOUS

## 2011-07-13 MED ORDER — HETASTARCH-ELECTROLYTES 6 % IV SOLN
INTRAVENOUS | Status: DC | PRN
  Administered 2011-07-13: 11:00:00 via INTRAVENOUS

## 2011-07-13 MED ORDER — SODIUM CHLORIDE 0.9 % IV SOLN
2500.0000 ug | INTRAVENOUS | Status: DC | PRN
  Administered 2011-07-13: 100 ug/h via INTRAVENOUS

## 2011-07-13 MED ORDER — LACTATED RINGERS IV SOLN
INTRAVENOUS | Status: DC | PRN
  Administered 2011-07-13 (×2): via INTRAVENOUS

## 2011-07-13 SURGICAL SUPPLY — 94 items
APPLIER CLIP 11 MED OPEN (CLIP) ×2
BANDAGE ELASTIC 4 VELCRO ST LF (GAUZE/BANDAGES/DRESSINGS) ×2 IMPLANT
BANDAGE ELASTIC 6 VELCRO ST LF (GAUZE/BANDAGES/DRESSINGS) ×6 IMPLANT
BIT DRILL MATTA 2.5MX180M (BIT) ×1 IMPLANT
BIT DRILL TWST MATTA 3.5MX195M (BIT) ×1 IMPLANT
BLADE SURG ROTATE 9660 (MISCELLANEOUS) IMPLANT
BRUSH SCRUB DISP (MISCELLANEOUS) ×4 IMPLANT
CLIP APPLIE 11 MED OPEN (CLIP) ×1 IMPLANT
CLOTH BEACON ORANGE TIMEOUT ST (SAFETY) ×2 IMPLANT
COVER SURGICAL LIGHT HANDLE (MISCELLANEOUS) ×4 IMPLANT
DRAIN CHANNEL 10F 3/8 F FF (DRAIN) IMPLANT
DRAIN CHANNEL 15F RND FF W/TCR (WOUND CARE) IMPLANT
DRAPE C-ARM 42X72 X-RAY (DRAPES) ×2 IMPLANT
DRAPE C-ARMOR (DRAPES) ×2 IMPLANT
DRAPE INCISE IOBAN 66X45 STRL (DRAPES) IMPLANT
DRAPE INCISE IOBAN 85X60 (DRAPES) ×4 IMPLANT
DRAPE ORTHO SPLIT 77X108 STRL (DRAPES) ×2
DRAPE STERILE CONDUCTIVE (DRAPES) ×2 IMPLANT
DRAPE SURG ORHT 6 SPLT 77X108 (DRAPES) ×2 IMPLANT
DRAPE U-SHAPE 47X51 STRL (DRAPES) ×2 IMPLANT
DRILL BIT MATTA 2.5MX180M (BIT) ×2
DRILL TWIST AO MATTA 3.5MX195M (BIT) ×2
DRSG ADAPTIC 3X8 NADH LF (GAUZE/BANDAGES/DRESSINGS) ×2 IMPLANT
DRSG MEPILEX BORDER 4X12 (GAUZE/BANDAGES/DRESSINGS) ×2 IMPLANT
DRSG MEPILEX BORDER 4X8 (GAUZE/BANDAGES/DRESSINGS) ×2 IMPLANT
DRSG PAD ABDOMINAL 8X10 ST (GAUZE/BANDAGES/DRESSINGS) IMPLANT
ELECT BLADE 6.5 EXT (BLADE) ×2 IMPLANT
ELECT CAUTERY BLADE 6.4 (BLADE) ×2 IMPLANT
ELECT REM PT RETURN 9FT ADLT (ELECTROSURGICAL) ×2
ELECTRODE REM PT RTRN 9FT ADLT (ELECTROSURGICAL) ×1 IMPLANT
EVACUATOR 1/8 PVC DRAIN (DRAIN) ×2 IMPLANT
EVACUATOR SILICONE 100CC (DRAIN) ×2 IMPLANT
GLOVE BIO SURGEON STRL SZ7.5 (GLOVE) ×2 IMPLANT
GLOVE BIO SURGEON STRL SZ8 (GLOVE) ×6 IMPLANT
GLOVE BIOGEL PI IND STRL 6.5 (GLOVE) ×3 IMPLANT
GLOVE BIOGEL PI IND STRL 7.5 (GLOVE) ×2 IMPLANT
GLOVE BIOGEL PI IND STRL 8 (GLOVE) ×1 IMPLANT
GLOVE BIOGEL PI INDICATOR 6.5 (GLOVE) ×3
GLOVE BIOGEL PI INDICATOR 7.5 (GLOVE) ×2
GLOVE BIOGEL PI INDICATOR 8 (GLOVE) ×1
GLOVE SS BIOGEL STRL SZ 6.5 (GLOVE) ×3 IMPLANT
GLOVE SUPERSENSE BIOGEL SZ 6.5 (GLOVE) ×3
GLOVE SURG SS PI 6.5 STRL IVOR (GLOVE) ×2 IMPLANT
GOWN PREVENTION PLUS XLARGE (GOWN DISPOSABLE) ×2 IMPLANT
GOWN STRL NON-REIN LRG LVL3 (GOWN DISPOSABLE) ×8 IMPLANT
GOWN STRL REIN 2XL LVL4 (GOWN DISPOSABLE) ×2 IMPLANT
HANDPIECE INTERPULSE COAX TIP (DISPOSABLE) ×1
K-WIRE 1.4X100 (WIRE) ×6
KIT BASIN OR (CUSTOM PROCEDURE TRAY) ×2 IMPLANT
KIT ROOM TURNOVER OR (KITS) ×2 IMPLANT
KWIRE 1.4X100 (WIRE) ×3 IMPLANT
LOOP VESSEL MAXI BLUE (MISCELLANEOUS) IMPLANT
MANIFOLD NEPTUNE II (INSTRUMENTS) ×2 IMPLANT
NEEDLE MAYO TROCAR (NEEDLE) IMPLANT
NS IRRIG 1000ML POUR BTL (IV SOLUTION) ×2 IMPLANT
PACK TOTAL JOINT (CUSTOM PROCEDURE TRAY) ×2 IMPLANT
PAD ARMBOARD 7.5X6 YLW CONV (MISCELLANEOUS) ×4 IMPLANT
PASSER SUT SWANSON 36MM LOOP (INSTRUMENTS) ×2 IMPLANT
PILLOW ABDUCTION HIP (SOFTGOODS) ×2 IMPLANT
PIN APEX 180X5XRDC SS (PIN) ×1 IMPLANT
PIN APEX 5X180 (PIN) ×1
PIN REDUCTION 6.0 (PIN) ×2 IMPLANT
PLATE ACET STRT 70.5M 6H (Plate) ×2 IMPLANT
PLATE ACET STRT 94.5M 8H (Plate) ×2 IMPLANT
PLEXUR M 10CC (Putty) ×2 IMPLANT
REDUCTION PIN 6.0X150 AO FITTING ×2 IMPLANT
SCREW CORT 10X3.5XST NS LF (Screw) ×1 IMPLANT
SCREW CORTEX ST MATTA 3.5X12MM (Screw) ×2 IMPLANT
SCREW CORTEX ST MATTA 3.5X22MM (Screw) ×2 IMPLANT
SCREW CORTEX ST MATTA 3.5X28MM (Screw) ×4 IMPLANT
SCREW CORTEX ST MATTA 3.5X30MM (Screw) ×4 IMPLANT
SCREW CORTEX ST MATTA 3.5X32MM (Screw) ×2 IMPLANT
SCREW CORTEX ST MATTA 3.5X34MM (Screw) ×4 IMPLANT
SCREW CORTEX ST MATTA 3.5X38M (Screw) ×2 IMPLANT
SCREW CORTICAL 3.5 (Screw) ×1 IMPLANT
SET HNDPC FAN SPRY TIP SCT (DISPOSABLE) ×1 IMPLANT
SPONGE GAUZE 4X4 12PLY (GAUZE/BANDAGES/DRESSINGS) IMPLANT
SPONGE LAP 18X18 X RAY DECT (DISPOSABLE) ×2 IMPLANT
STAPLER VISISTAT 35W (STAPLE) ×4 IMPLANT
SUCTION FRAZIER TIP 10 FR DISP (SUCTIONS) ×2 IMPLANT
SUT FIBERWIRE #2 38 T-5 BLUE (SUTURE) ×2
SUT VIC AB 0 CT1 27 (SUTURE) ×2
SUT VIC AB 0 CT1 27XBRD ANBCTR (SUTURE) ×2 IMPLANT
SUT VIC AB 1 CT1 18XCR BRD 8 (SUTURE) ×1 IMPLANT
SUT VIC AB 1 CT1 27 (SUTURE)
SUT VIC AB 1 CT1 27XBRD ANTBC (SUTURE) IMPLANT
SUT VIC AB 1 CT1 8-18 (SUTURE) ×1
SUT VIC AB 2-0 CT1 27 (SUTURE) ×2
SUT VIC AB 2-0 CT1 TAPERPNT 27 (SUTURE) ×2 IMPLANT
SUTURE FIBERWR #2 38 T-5 BLUE (SUTURE) ×1 IMPLANT
TOWEL OR 17X24 6PK STRL BLUE (TOWEL DISPOSABLE) ×2 IMPLANT
TOWEL OR 17X26 10 PK STRL BLUE (TOWEL DISPOSABLE) ×4 IMPLANT
TRAY FOLEY CATH 14FR (SET/KITS/TRAYS/PACK) IMPLANT
WATER STERILE IRR 1000ML POUR (IV SOLUTION) IMPLANT

## 2011-07-13 NOTE — Brief Op Note (Signed)
06/30/2011 - 07/13/2011  2:11 PM  PATIENT:  [name]  45 y.o. female  PRE-OPERATIVE DIAGNOSIS:  left acetabular fracture, T type with posterior wall  POST-OPERATIVE DIAGNOSIS:  left acetabular fracture, T type with posterior wall  PROCEDURE:  Procedure(s): OPEN REDUCTION INTERNAL FIXATION (ORIF) ACETABULAR FRACTURE, , T type with posterior wall  SURGEON:  Surgeon(s): Doralee Albino Ambrosio Reuter  ASSISTANTS: Montez Morita, [facility]   ANESTHESIA:   general  EBL:  Total I/O In: 3050 [I.V.:1850; Blood:700; IV Piggyback:500] Out: 1250 [Urine:650; Blood:600]  BLOOD ADMINISTERED:750 CC PRBC  DRAINS: none   LOCAL MEDICATIONS USED:  NONE  SPECIMEN:  No Specimen  DISPOSITION OF SPECIMEN:  N/A  COUNTS:  YES  TOURNIQUET:  * No tourniquets in log *  DICTATION: .Other Dictation: Dictation Number [id number]  PLAN OF CARE: Admit to inpatient   PATIENT DISPOSITION:  ICU - intubated and hemodynamically stable.   Delay start of Pharmacological VTE agent (>24hrs) due to surgical blood loss or risk of bleeding:  {YES/NO/NOT APPLICABLE:20182

## 2011-07-13 NOTE — Anesthesia Postprocedure Evaluation (Signed)
  Anesthesia Post-op Note  Patient: [name]  Procedure(s) Performed:  OPEN REDUCTION INTERNAL FIXATION (ORIF) ACETABULAR FRACTURE  Patient Location: ICU  Anesthesia Type: General  Level of Consciousness: sedated  Airway and Oxygen Therapy: Patient remains intubated per anesthesia plan  Post-op Pain: none  Post-op Assessment: Post-op Vital signs reviewed, Patient's Cardiovascular Status Stable and Respiratory Function Stable  Post-op Vital Signs: Reviewed and stable  Complications: No apparent anesthesia complications

## 2011-07-13 NOTE — Interval H&P Note (Signed)
History and Physical Interval Note:  07/13/2011 8:19 AM  [name]  has presented today for surgery, with the diagnosis of left acetabular fracture  The various methods of treatment have been discussed with the patient and family. After consideration of risks, benefits and other options for treatment, the patient's family has consented to  Procedure(s): OPEN REDUCTION INTERNAL FIXATION (ORIF) ACETABULAR FRACTURE as a surgical intervention with dressing changes and possible resplinting for right LEx.  The patients' history has been reviewed, patient examined, no change in status, stable for surgery.  I have reviewed the patients' chart and labs.  Questions were answered to the patient family's satisfaction.     Keivon Garden H

## 2011-07-13 NOTE — Preoperative (Signed)
Beta Blockers   Reason not to administer Beta Blockers:Not Applicable 

## 2011-07-13 NOTE — Progress Notes (Signed)
She has gone to OR for acetabular fixation, Will see again postoperatively.  Try to wean to extubate tomorrow.  This patient has been seen and I agree with the findings and treatment plan.  Marta Lamas. Gae Bon, MD, FACS [phone] (pager) [phone] (direct pager) Trauma Surgeon

## 2011-07-13 NOTE — H&P (View-Only) (Signed)
Subjective: Intubated and sedated now. Communicated with family and patient last night re surgery cancellation late yesterday night and rescheduling for first case tomorrow.  Objective: Vital signs in last 24 hours: Temp:  [97.9 F (36.6 C)-100.4 F (38 C)] 100.3 F (37.9 C) (12/19 0323) Pulse Rate:  [77-101] 93  (12/19 0748) Resp:  [17-32] 20  (12/19 0748) BP: (96-125)/(46-70) 114/61 mmHg (12/19 0748) SpO2:  [95 %-100 %] 97 % (12/19 0748) FiO2 (%):  [30 %-35.2 %] 30 % (12/19 0748) Weight:  [113.3 kg (249 lb 12.5 oz)] 249 lb 12.5 oz (113.3 kg) (12/19 0500) Weight change: 0.3 kg (10.6 oz) Last BM Date: 07/09/11  Intake/Output from previous day: 12/18 0701 - 12/19 0700 In: 2301.4 [I.V.:626.4; NG/GT:675; IV Piggyback:1000] Out: 2525 [Urine:2525]  MSK: No change in examination; able to demonstrate clear and forceful DPN, SPN, TN motor and reported intact sensation all distributions last night while alert.  Lab Results:  Basename 07/12/11 0400 07/11/11 0400  WBC 16.7* 19.4*  HGB 8.3* 9.2*  HCT 26.4* 28.6*  PLT 380 377   BMET  Basename 07/12/11 0400 07/11/11 0400  NA 137 134*  K 3.9 4.2  CL 101 98  CO2 28 28  GLUCOSE 226* 148*  BUN 14 14  CREATININE 0.39* 0.33*  CALCIUM 8.0* 8.5    Studies/Results: Dg Chest Port 1 View  07/11/2011  *RADIOLOGY REPORT*  Clinical Data: ARDS.  Ventilator.  PORTABLE CHEST - 1 VIEW  Comparison: 07/10/2011  Findings: Support devices.  Bilateral perihilar and lower lobe airspace opacities may be slightly improved, particularly in the right base.  Mild cardiomegaly.  No visible effusions.  No acute bony abnormality.  IMPRESSION: Bilateral patchy airspace opacities, slightly improved in the right base.  Original Report Authenticated By: KEVIN G. DOVER, M.D.    DVT prophylaxis: filter  Assessment/Plan: Plan for ORIF of L acetabulum tomorrow am with dressing changes.   LOS: 12 days   Jamilyn Pigeon H 07/12/2011, 8:08 AM    

## 2011-07-13 NOTE — Anesthesia Preprocedure Evaluation (Addendum)
Anesthesia Evaluation  Patient identified by MRN, date of birth, ID bandGeneral Assessment Comment:Pt intubated and sedated  Reviewed: Allergy & Precautions, H&P , NPO status , Patient's Chart, lab work & pertinent test results, reviewed documented beta blocker date and time   Airway      Comment: Intubated Dental   Pulmonary former smoker         Cardiovascular hypertension, Pt. on home beta blockers + CAD and + Cardiac Stents     Neuro/Psych PSYCHIATRIC DISORDERS Anxiety    GI/Hepatic   Endo/Other  Diabetes mellitus-, Type 2  Renal/GU      Musculoskeletal   Abdominal   Peds  Hematology anemia   Anesthesia Other Findings   Reproductive/Obstetrics                          Anesthesia Physical Anesthesia Plan  ASA: IV  Anesthesia Plan: General   Post-op Pain Management:    Induction: Inhalational  Airway Management Planned: Oral ETT  Additional Equipment:   Intra-op Plan:   Post-operative Plan: Post-operative intubation/ventilation  Informed Consent: I have reviewed the patients History and Physical, chart, labs and discussed the procedure including the risks, benefits and alternatives for the proposed anesthesia with the patient or authorized representative who has indicated his/her understanding and acceptance.   Dental advisory given  Plan Discussed with: Anesthesiologist and Surgeon  Anesthesia Plan Comments:       Anesthesia Quick Evaluation
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
## 2011-07-13 NOTE — Transfer of Care (Signed)
Immediate Anesthesia Transfer of Care Note  Patient: [name]  Procedure(s) Performed:  OPEN REDUCTION INTERNAL FIXATION (ORIF) ACETABULAR FRACTURE  Patient Location: ICU  Anesthesia Type: General  Level of Consciousness: sedated  Airway & Oxygen Therapy: Patient remains intubated per anesthesia plan  Post-op Assessment: Post -op Vital signs reviewed and stable  Post vital signs: Reviewed and stable  Complications: No apparent anesthesia complications

## 2011-07-14 ENCOUNTER — Encounter (HOSPITAL_COMMUNITY): Payer: Self-pay | Admitting: Orthopedic Surgery

## 2011-07-14 ENCOUNTER — Ambulatory Visit
Admit: 2011-07-14 | Discharge: 2011-07-14 | Disposition: A | Discharge status: Home / Self Care | Payer: 59 | Attending: Radiation Oncology | Admitting: Radiation Oncology

## 2011-07-14 ENCOUNTER — Ambulatory Visit: Payer: 59

## 2011-07-14 ENCOUNTER — Encounter: Payer: Self-pay | Admitting: Radiation Oncology

## 2011-07-14 ENCOUNTER — Inpatient Hospital Stay (HOSPITAL_COMMUNITY): Payer: No Typology Code available for payment source

## 2011-07-14 LAB — POCT I-STAT 7, (LYTES, BLD GAS, ICA,H+H)
Acid-Base Excess: 4 mmol/L — ABNORMAL HIGH (ref 0.0–2.0)
Calcium, Ion: 1.06 mmol/L — ABNORMAL LOW (ref 1.12–1.32)
Calcium, Ion: 1.1 mmol/L — ABNORMAL LOW (ref 1.12–1.32)
HCT: 30 % — ABNORMAL LOW (ref 36.0–46.0)
Hemoglobin: 9.9 g/dL — ABNORMAL LOW (ref 12.0–15.0)
O2 Saturation: 100 %
Patient temperature: 37.1
Potassium: 4.7 mEq/L (ref 3.5–5.1)
Potassium: 5 mEq/L (ref 3.5–5.1)
Sodium: 134 mEq/L — ABNORMAL LOW (ref 135–145)
pCO2 arterial: 47.5 mmHg — ABNORMAL HIGH (ref 35.0–45.0)
pH, Arterial: 7.354 (ref 7.350–7.400)
pO2, Arterial: 143 mmHg — ABNORMAL HIGH (ref 80.0–100.0)

## 2011-07-14 LAB — TYPE AND SCREEN
Antibody Screen: NEGATIVE
Unit division: 0
Unit division: 0

## 2011-07-14 LAB — GLUCOSE, CAPILLARY
Glucose-Capillary: 151 mg/dL — ABNORMAL HIGH (ref 70–99)
Glucose-Capillary: 188 mg/dL — ABNORMAL HIGH (ref 70–99)

## 2011-07-14 LAB — CULTURE, BLOOD (ROUTINE X 2): Culture: NO GROWTH

## 2011-07-14 LAB — CBC
MCH: 29.5 pg (ref 26.0–34.0)
MCV: 91.5 fL (ref 78.0–100.0)
Platelets: 366 10*3/uL (ref 150–400)
RDW: 15.8 % — ABNORMAL HIGH (ref 11.5–15.5)
WBC: 13.9 10*3/uL — ABNORMAL HIGH (ref 4.0–10.5)

## 2011-07-14 LAB — BASIC METABOLIC PANEL
CO2: 30 mEq/L (ref 19–32)
Calcium: 8.1 mg/dL — ABNORMAL LOW (ref 8.4–10.5)
Chloride: 101 mEq/L (ref 96–112)
Glucose, Bld: 199 mg/dL — ABNORMAL HIGH (ref 70–99)
Potassium: 4.1 mEq/L (ref 3.5–5.1)
Sodium: 137 mEq/L (ref 135–145)
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
## 2011-07-14 NOTE — Progress Notes (Signed)
Respiratory Therapy note-monitoring patient at [facility], with RN. Patient has remained stable on transport ventilator from [facility].

## 2011-07-14 NOTE — Progress Notes (Signed)
Simulation/treatment planning note:  The patient was placed on the CT simulation table supine. An arbitrary isocenter was placed on her left hip region. She was then scanned. The isocenter was moved to the acetabulum. She is setup to AP and PA fields. 2 separate multileaf collimators were designed. I prescribed dose of 700 cGy single fraction utilizing 18 MV photons. I chose the 97% isodose curve to cover the target volume. An isodose plan and dosimetry were requested and reviewed.

## 2011-07-15 ENCOUNTER — Inpatient Hospital Stay (HOSPITAL_COMMUNITY): Payer: No Typology Code available for payment source

## 2011-07-15 LAB — CBC
Hemoglobin: 8 g/dL — ABNORMAL LOW (ref 12.0–15.0)
MCH: 28.7 pg (ref 26.0–34.0)
MCHC: 31.1 g/dL (ref 30.0–36.0)
Platelets: 400 10*3/uL (ref 150–400)
RBC: 2.79 MIL/uL — ABNORMAL LOW (ref 3.87–5.11)

## 2011-07-15 LAB — BASIC METABOLIC PANEL
CO2: 30 mEq/L (ref 19–32)
Calcium: 8.3 mg/dL — ABNORMAL LOW (ref 8.4–10.5)
GFR calc non Af Amer: >90 mL/min (ref >90)
Glucose, Bld: 200 mg/dL — ABNORMAL HIGH (ref 70–99)
Potassium: 3.8 mEq/L (ref 3.5–5.1)
Sodium: 136 mEq/L (ref 135–145)

## 2011-07-15 LAB — URINALYSIS, ROUTINE W REFLEX MICROSCOPIC
Bilirubin Urine: NEGATIVE
Glucose, UA: NEGATIVE mg/dL
Nitrite: NEGATIVE
Specific Gravity, Urine: 1.023 (ref 1.005–1.030)
pH: 6.5 (ref 5.0–8.0)

## 2011-07-15 LAB — URINE MICROSCOPIC-ADD ON

## 2011-07-15 MED ORDER — FENTANYL CITRATE 0.05 MG/ML IJ SOLN
50.0000 ug | INTRAMUSCULAR | Status: DC | PRN
  Administered 2011-07-16: 100 ug via INTRAVENOUS
  Filled 2011-07-15: qty 2

## 2011-07-15 MED ORDER — LEVALBUTEROL HCL 0.63 MG/3ML IN NEBU
0.6300 mg | INHALATION_SOLUTION | Freq: Four times a day (QID) | RESPIRATORY_TRACT | Status: DC | PRN
  Filled 2011-07-15: qty 3

## 2011-07-15 NOTE — Procedures (Signed)
Extubation Procedure Note  Patient Details:   Name: [name] DOB: Jan 12, 1966 MRN: [id number]   Patient extubated per MD order and placed on a 3L [place], sats 96%.  Patient able to verbalize post extubation.  No respiratory distress or stridor noted.  RT will monitor patient.   Evaluation  O2 sats: stable throughout Complications: No apparent complications Patient did tolerate procedure well. Bilateral Breath Sounds: Clear;Diminished Suctioning: Airway Yes  [name] 07/15/2011, 11:05 AM
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
## 2011-07-15 NOTE — Sedation Documentation (Signed)
Wasted 25ml of Versed in the sink. Trudee Kuster RN, witness by Saintclair Halsted.

## 2011-07-15 NOTE — Sedation Documentation (Signed)
watsed 100 ml fentanyl infusion with day nurse, monica watlington, rn

## 2011-07-15 NOTE — Progress Notes (Signed)
Speech Language/Pathology Clinical/Bedside Swallow Evaluation Patient Details  Name: [name] MRN: [id number] DOB: 06-Feb-1966 Today's Date: 07/15/2011  Past Medical History:  Past Medical History  Diagnosis Date  . CAD (coronary artery disease)     DES RCA for MI,11/2005 /  nuclear 10/2008 , 53%, no scar or ischemia  . Ejection fraction     55% cath 2007  /  53% nuclear, 10/2008, inferior hypo  . Carotid artery disease   . Overweight   . Tobacco abuse   . Dyslipidemia   . S/P hysterectomy     Very large fibroids.  . Anxiety   . Thyroid disorder     Left lobe of thyroid as abnormal appearance noted on carotid Doppler September 21,   Past Surgical History:  Past Surgical History  Procedure Date  . Fracture surgery   . Tibia im nail insertion 07/02/2011    Procedure: INTRAMEDULLARY (IM) NAIL TIBIAL;  Surgeon: Kathryne Hitch;  Location: [facility] OR;  Service: Orthopedics;  Laterality: Right;  . Orif acetabular fracture 07/13/2011    Procedure: OPEN REDUCTION INTERNAL FIXATION (ORIF) ACETABULAR FRACTURE;  Surgeon: Budd Palmer;  Location: [facility] OR;  Service: Orthopedics;  Laterality: Left;   HPI:      Assessment/Recommendations/Treatment Plan    SLP Assessment Clinical Impression Statement: Moderate oropharyngeal dysphagia characterized mainly by significant delay in initiation and decreased sensation secondary to lethargy. +s/s of apsiraton s/p swallow  ice chips and thin water by spoon and cup. Recommend to proceed with conservative diet of dysphagia 1 (puree) and honey thick liquids with strict aspiration precautions due to  lethargy. ST to follow on 07-16-11 for diet tolerance.  Objective evaluation of  FEES to be determined Risk for Aspiration: Moderate Other Related Risk Factors: Lethargy;Decreased respiratory status  Recommendations Solid Consistency: Dysphagia 1 (Puree) Liquid Consistency: Honey Liquid Administration via: Cup;No straw Medication Administration:  Crushed with puree Supervision: Staff feed patient;Full supervision/cueing for compensatory strategies Compensations: Small sips/bites;Slow rate;Clear throat intermittently;Follow solids with liquid;Multiple dry swallows after each bite/sip;Effortful swallow Postural Changes and/or Swallow Maneuvers: Out of bed for meals;Upright 30-60 min after meal Oral Care Recommendations: Oral care before and after PO Follow up Recommendations: Inpatient Rehab  Treatment Plan Speech Therapy Frequency: min 2x/week Treatment Duration: 2 weeks Interventions: Aspiration precaution training;Trials of upgraded texture/liquids;Diet toleration management by SLP;Patient/family education  Prognosis Prognosis for Safe Diet Advancement: Good  Individuals Consulted Consulted and Agree with Results and Recommendations: Patient;Family member/caregiver  Swallowing Goals  SLP Swallowing Goals Patient will consume recommended diet without observed clinical signs of aspiration with: Minimal assistance Patient will utilize recommended strategies during swallow to increase swallowing safety with: Minimal assistance     General  Date of Onset: 06/30/11 Type of Study: Bedside swallow evaluation Diet Prior to this Study: IV Respiratory Status: Supplemental O2 delivered via (comment) Trach Size and Type:  (nasal cannula) History of Intubation: Yes Length of Intubations (days): 16 days Date extubated: 07/15/11 Behavior/Cognition: Cooperative;Confused;Lethargic;Distractible;Requires cueing Oral Cavity - Dentition: Adequate natural dentition Patient Positioning: Upright in bed Baseline Vocal Quality: Breathy;Hoarse;Low vocal intensity Volitional Cough: Strong Volitional Swallow: Able to elicit Ice chips: Tested (comment)  Oral Motor/Sensory Function  Labial ROM: Within Functional Limits Labial Symmetry: Within Functional Limits Labial Strength: Within Functional Limits Labial Sensation: Within Functional  Limits Lingual ROM: Within Functional Limits Lingual Symmetry: Within Functional Limits Lingual Strength: Within Functional Limits Lingual Sensation: Within Functional Limits Facial ROM: Within Functional Limits Facial Symmetry: Within Functional Limits Facial Strength: Within Functional Limits  Facial Sensation: Within Functional Limits Velum: Within Functional Limits Mandible: Within Functional Limits  Consistency Results  Ice Chips Ice chips: Impaired Pharyngeal Phase Impairments: Delayed Swallow;Throat Clearing - Immediate  Thin Liquid Thin Liquid: Impaired Pharyngeal  Phase Impairments: Delayed Swallow  Nectar Thick Liquid Nectar Thick Liquid: Not tested  Honey Thick Liquid Honey Thick Liquid: Impaired Pharyngeal Phase Impairments: Delayed Swallow  Puree Puree: Impaired Pharyngeal Phase Impairments: Delayed Swallow  Solid Solid: Not tested Moreen Fowler M.S., CCC-SLP [phone]  [facility] 07/15/2011,4:10 PM

## 2011-07-15 NOTE — Sedation Documentation (Signed)
Fentanyl 100 mcg wasted in sink  [employer] witnessed by Marcine Matar.
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
## 2011-07-16 LAB — BASIC METABOLIC PANEL
CO2: 28 mEq/L (ref 19–32)
Calcium: 8.5 mg/dL (ref 8.4–10.5)
Creatinine, Ser: 0.36 mg/dL — ABNORMAL LOW (ref 0.50–1.10)
GFR calc non Af Amer: >90 mL/min (ref >90)

## 2011-07-16 LAB — CBC
MCH: 28.8 pg (ref 26.0–34.0)
MCHC: 31.5 g/dL (ref 30.0–36.0)
MCV: 91.5 fL (ref 78.0–100.0)
Platelets: 383 10*3/uL (ref 150–400)
RBC: 2.71 MIL/uL — ABNORMAL LOW (ref 3.87–5.11)
RDW: 14.8 % (ref 11.5–15.5)

## 2011-07-16 MED ORDER — INSULIN GLARGINE 100 UNIT/ML ~~LOC~~ SOLN
15.0000 [IU] | SUBCUTANEOUS | Status: DC
  Administered 2011-07-16: 15 [IU] via SUBCUTANEOUS

## 2011-07-16 MED ORDER — HYDROCODONE-ACETAMINOPHEN 5-325 MG PO TABS
1.0000 | ORAL_TABLET | ORAL | Status: DC | PRN
  Administered 2011-07-16 – 2011-07-18 (×5): 2 via ORAL
  Administered 2011-07-18: 1 via ORAL
  Administered 2011-07-19 – 2011-07-21 (×7): 2 via ORAL
  Administered 2011-07-23 – 2011-07-24 (×2): 1 via ORAL
  Filled 2011-07-16 (×3): qty 2
  Filled 2011-07-16: qty 1
  Filled 2011-07-16 (×2): qty 2
  Filled 2011-07-16: qty 1
  Filled 2011-07-16 (×4): qty 2
  Filled 2011-07-16: qty 1
  Filled 2011-07-16 (×5): qty 2

## 2011-07-16 MED ORDER — WHITE PETROLATUM GEL
Status: AC
  Administered 2011-07-16: 01:00:00
  Filled 2011-07-16: qty 5

## 2011-07-16 MED ORDER — HYDROMORPHONE HCL PF 1 MG/ML IJ SOLN
0.5000 mg | INTRAMUSCULAR | Status: DC | PRN
  Administered 2011-07-17: 1 mg via INTRAVENOUS
  Filled 2011-07-16: qty 1
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
## 2011-07-16 NOTE — Progress Notes (Signed)
Speech Language/Pathology Speech Language Pathology Swallow Treatment Patient Details Name: [name] MRN: [id number] DOB: 10/28/1965 Today's Date: 07/16/2011  SLP Assessment/Plan/Recommendation Assessment Clinical Impression Statement: Patient seen for diagnostic treatment for diet tolerance and possible diet advancement following initial BSE completed on 07-15-11.  Improvement in LOA and swallow response this treatment.  Thin liquid administered by cup 6oz by patient  with effective cough x2 initial two swallows. No observed s/s of aspiration noted with trials of solids. Recommend to upgrade to Dysphagia 3 and thin liquid by cup sips only.  Even with improved LOA recommend  modifed diet  as patient observed to fatigue easily. Continue strict aspiration precautions with full supervision with all  PO intake.   ST to follow for diet tolerance and advancement. Patient's spouse and family members present during treatment and demonstrated understanding of recommendations of continued aspiration precautions.  Swallowing Goals  SLP Swallowing Goals Swallow Study Goal #1 - Progress: Progressing toward goal Swallow Study Goal #2 - Progress: Progressing toward goal  General Patient observed directly with PO's: Yes Type of PO's observed: Regular;Dysphagia 3 (soft);Dysphagia 1 (puree);Thin liquids;Ice chips Feeding: Needs assist Liquids provided via: Cup Temperature Spikes Noted: No Behavior/Cognition: Cooperative;Lethargic Oral Cavity - Dentition: Adequate natural dentition Patient Positioning: Upright in bed   Tereso Newcomer., S., CCC-SLP [phone]  [facility] 07/16/2011, 3:18 PM

## 2011-07-16 NOTE — Progress Notes (Signed)
07/16/2011             Pt now with increased activity to OOB with therapy. OT to follow acutely and evaluated at later date/time.   Harrel Carina [place]   OTR/L Pager: [phone] Office: [phone] .

## 2011-07-16 NOTE — Progress Notes (Signed)
Orthopedic Tech Progress Note Patient Details:  [name] [birth year] [id number]  Type of Splint: Other (comment) Splint Location: applied splint to left hand    [name] 07/16/2011, 12:05 PM

## 2011-07-16 NOTE — Progress Notes (Signed)
Trauma Service Note  Subjective: Patient is coughing a lot as if she were aspirating sputum.  Alert and awake.  On D I diet.  Objective: Vital signs in last 24 hours: Temp:  [98.4 F (36.9 C)-101.8 F (38.8 C)] 98.4 F (36.9 C) (12/23 0800) Pulse Rate:  [95-109] 96  (12/23 0900) Resp:  [18-33] 25  (12/23 0900) BP: (102-127)/(52-77) 118/54 mmHg (12/23 0900) SpO2:  [92 %-98 %] 97 % (12/23 0900) FiO2 (%):  [30 %] 30 % (12/22 1000) Weight:  [228 lb 13.4 oz (103.8 kg)] 228 lb 13.4 oz (103.8 kg) (12/23 0500) Last BM Date: 07/15/11  Intake/Output from previous day: 12/22 0701 - 12/23 0700 In: 1720 [P.O.:120; I.V.:1265; NG/GT:135; IV Piggyback:200] Out: 2726 [Urine:2720; Stool:6] Intake/Output this shift: Total I/O In: 100 [I.V.:100] Out: 100 [Urine:100]  General: No acute distress.  Oriented  Lungs: Mildly rhoncherous on the right, O2 sats are good on 3 liters  Abd: Soft, non-tender  Extremities: Wrapped in Ace wraps bilaterally  Neuro: Intact  Lab Results: CBC   Basename 07/16/11 0620 07/15/11 0445  WBC 11.1* 13.4*  HGB 7.8* 8.0*  HCT 24.8* 25.7*  PLT 383 400   BMET  Basename 07/16/11 0620 07/15/11 0445  NA 136 136  K 4.0 3.8  CL 100 101  CO2 28 30  GLUCOSE 134* 200*  BUN 8 13  CREATININE 0.36* 0.38*  CALCIUM 8.5 8.3*   PT/INR No results found for this basename: LABPROT:2,INR:2 in the last 72 hours ABG  Basename 07/13/11 1150 07/13/11 1049  PHART 7.411* 7.354  HCO3 29.4* 26.4*    Studies/Results: Dg Chest Port 1 View  07/15/2011  *RADIOLOGY REPORT*  Clinical Data: Respiratory failure  PORTABLE CHEST - 1 VIEW  Comparison:   the previous day's study  Findings: Endotracheal tube, nasogastric tube, and right arm PICC are stable in position.  Moderate predominately perihilar and bibasilar interstitial and alveolar edema or infiltrates may be marginally improved.  Mild cardiomegaly stable.  No definite effusion.  Regional bones unremarkable.  IMPRESSION:  1.   Marginal improvement in bilateral edema or infiltrates. 2. Support hardware stable in position.  Original Report Authenticated By: Osa Craver, M.D.    Anti-infectives: Anti-infectives     Start     Dose/Rate Route Frequency Ordered Stop   07/13/11 1255   vancomycin (VANCOCIN) 1 GM/200ML IVPB     Comments: BUTLER, DAVE: cabinet override         07/13/11 1255     07/11/11 2215   vancomycin (VANCOCIN) IVPB 1000 mg/200 mL premix  Status:  Discontinued        1,000 mg 200 mL/hr over 60 Minutes Intravenous 3 times per day 07/11/11 2210 07/12/11 1146   07/11/11 2200   vancomycin (VANCOCIN) 1,250 mg in sodium chloride 0.9 % 250 mL IVPB  Status:  Discontinued        1,250 mg 166.7 mL/hr over 90 Minutes Intravenous Every 8 hours 07/11/11 2200 07/11/11 2203   07/11/11 1400   metroNIDAZOLE (FLAGYL) IVPB 500 mg  Status:  Discontinued        500 mg 100 mL/hr over 60 Minutes Intravenous Every 8 hours 07/11/11 1323 07/12/11 1013   07/11/11 1000   fluconazole (DIFLUCAN) IVPB 200 mg        200 mg 100 mL/hr over 60 Minutes Intravenous Every 24 hours 07/11/11 0906 07/16/11 0959   07/10/11 2200   vancomycin (VANCOCIN) IVPB 1000 mg/200 mL premix  Status:  Discontinued  1,000 mg 200 mL/hr over 60 Minutes Intravenous 3 times per day 07/10/11 1236 07/11/11 2200   07/05/11 2330   vancomycin (VANCOCIN) IVPB 1000 mg/200 mL premix  Status:  Discontinued        1,000 mg 200 mL/hr over 60 Minutes Intravenous Every 12 hours 07/05/11 1055 07/10/11 1236   07/05/11 1200   Levofloxacin (LEVAQUIN) IVPB 750 mg  Status:  Discontinued        750 mg 100 mL/hr over 90 Minutes Intravenous Every 24 hours 07/05/11 1101 07/12/11 1146   07/05/11 1130   vancomycin (VANCOCIN) 2,000 mg in sodium chloride 0.9 % 500 mL IVPB        2,000 mg 250 mL/hr over 120 Minutes Intravenous  Once 07/05/11 1055 07/05/11 1457   07/04/11 1100   Levofloxacin (LEVAQUIN) IVPB 750 mg  Status:  Discontinued        750 mg 100  mL/hr over 90 Minutes Intravenous Every 24 hours 07/04/11 1049 07/05/11 1023   07/04/11 0800   vancomycin (VANCOCIN) IVPB 1000 mg/200 mL premix        1,000 mg 200 mL/hr over 60 Minutes Intravenous  Once 07/03/11 1140 07/04/11 0939   07/02/11 1400   clindamycin (CLEOCIN) IVPB 600 mg        600 mg 100 mL/hr over 30 Minutes Intravenous 3 times per day 07/02/11 1035 07/03/11 0715   07/02/11 0800   clindamycin (CLEOCIN) IVPB 600 mg        600 mg 100 mL/hr over 30 Minutes Intravenous To Surgery 07/02/11 0721 07/02/11 0750          Assessment/Plan: s/p Procedure(s): OPEN REDUCTION INTERNAL FIXATION (ORIF) ACETABULAR FRACTURE Transfer to SDU Discontinue Triadyne bed, get air Overlay bed.   LOS: 16 days   Marta Lamas. Gae Bon, MD, FACS [phone] Trauma Surgeon 07/16/2011
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
## 2011-07-17 LAB — GLUCOSE, CAPILLARY
Glucose-Capillary: 149 mg/dL — ABNORMAL HIGH (ref 70–99)
Glucose-Capillary: 158 mg/dL — ABNORMAL HIGH (ref 70–99)
Glucose-Capillary: 177 mg/dL — ABNORMAL HIGH (ref 70–99)
Glucose-Capillary: 165 mg/dL — ABNORMAL HIGH (ref 70–99)
Glucose-Capillary: 178 mg/dL — ABNORMAL HIGH (ref 70–99)
Glucose-Capillary: 136 mg/dL — ABNORMAL HIGH (ref 70–99)
Glucose-Capillary: 148 mg/dL — ABNORMAL HIGH (ref 70–99)
Glucose-Capillary: 134 mg/dL — ABNORMAL HIGH (ref 70–99)
Glucose-Capillary: 121 mg/dL — ABNORMAL HIGH (ref 70–99)
Glucose-Capillary: 103 mg/dL — ABNORMAL HIGH (ref 70–99)
Glucose-Capillary: 211 mg/dL — ABNORMAL HIGH (ref 70–99)
Glucose-Capillary: 109 mg/dL — ABNORMAL HIGH (ref 70–99)

## 2011-07-17 MED ORDER — INSULIN GLARGINE 100 UNIT/ML ~~LOC~~ SOLN
10.0000 [IU] | SUBCUTANEOUS | Status: DC
  Administered 2011-07-22 – 2011-07-23 (×2): 10 [IU] via SUBCUTANEOUS
  Filled 2011-07-17: qty 3

## 2011-07-17 MED ORDER — SITAGLIPTIN PHOS-METFORMIN HCL 50-500 MG PO TABS: 1.0000 | ORAL_TABLET | Freq: Two times a day (BID) | ORAL | Status: DC

## 2011-07-17 MED ORDER — FERROUS SULFATE 300 (60 FE) MG/5ML PO SYRP
300.0000 mg | ORAL_SOLUTION | Freq: Three times a day (TID) | ORAL | Status: DC
  Administered 2011-07-17 – 2011-07-24 (×19): 300 mg via ORAL
  Filled 2011-07-17 (×24): qty 5

## 2011-07-17 MED ORDER — METFORMIN HCL 500 MG PO TABS: 1000.0000 mg | ORAL_TABLET | Freq: Every day | ORAL | Status: DC

## 2011-07-17 MED ORDER — METOPROLOL TARTRATE 25 MG PO TABS: 25.0000 mg | ORAL_TABLET | Freq: Every day | ORAL | Status: DC

## 2011-07-17 MED ORDER — METOPROLOL TARTRATE 25 MG PO TABS
25.0000 mg | ORAL_TABLET | Freq: Two times a day (BID) | ORAL | Status: DC
  Administered 2011-07-17 – 2011-07-24 (×15): 25 mg via ORAL
  Filled 2011-07-17 (×15): qty 1

## 2011-07-17 MED ORDER — METFORMIN HCL 500 MG PO TABS
1000.0000 mg | ORAL_TABLET | Freq: Two times a day (BID) | ORAL | Status: DC
  Administered 2011-07-17 – 2011-07-18 (×2): 1000 mg via ORAL
  Filled 2011-07-17 (×6): qty 2

## 2011-07-17 MED ORDER — NITROGLYCERIN 0.4 MG SL SUBL: 0.4000 mg | SUBLINGUAL_TABLET | SUBLINGUAL | Status: DC | PRN

## 2011-07-17 MED ORDER — LINAGLIPTIN 5 MG PO TABS
5.0000 mg | ORAL_TABLET | Freq: Every day | ORAL | Status: DC
  Administered 2011-07-17 – 2011-07-18 (×2): 5 mg via ORAL
  Filled 2011-07-17 (×3): qty 1

## 2011-07-17 MED ORDER — VITAMIN E 180 MG (400 UNIT) PO CAPS
400.0000 [IU] | ORAL_CAPSULE | Freq: Three times a day (TID) | ORAL | Status: DC
  Administered 2011-07-17 – 2011-07-24 (×19): 400 [IU] via ORAL
  Filled 2011-07-17 (×26): qty 1

## 2011-07-17 MED ORDER — PANTOPRAZOLE SODIUM 40 MG PO PACK
40.0000 mg | PACK | Freq: Every day | ORAL | Status: DC
  Administered 2011-07-18 – 2011-07-24 (×6): 40 mg via ORAL
  Filled 2011-07-17 (×7): qty 20

## 2011-07-17 MED ORDER — ALPRAZOLAM 0.5 MG PO TABS
0.5000 mg | ORAL_TABLET | Freq: Two times a day (BID) | ORAL | Status: DC | PRN
  Administered 2011-07-17 – 2011-07-24 (×8): 0.5 mg via ORAL
  Filled 2011-07-17 (×8): qty 1

## 2011-07-17 MED ORDER — ASPIRIN 81 MG PO CHEW
162.0000 mg | CHEWABLE_TABLET | Freq: Every day | ORAL | Status: DC
  Administered 2011-07-18 – 2011-07-24 (×7): 162 mg via ORAL
  Filled 2011-07-17 (×8): qty 2

## 2011-07-17 MED ORDER — VITAMIN E 100 UNT/0.25ML PO OIL: 400.0000 [IU] | TOPICAL_OIL | Freq: Three times a day (TID) | ORAL | Status: DC

## 2011-07-17 MED ORDER — BENAZEPRIL HCL 5 MG PO TABS
5.0000 mg | ORAL_TABLET | Freq: Every day | ORAL | Status: DC
  Administered 2011-07-18 – 2011-07-24 (×7): 5 mg via ORAL
  Filled 2011-07-17 (×7): qty 1

## 2011-07-17 NOTE — Progress Notes (Signed)
Speech Language/Pathology Speech Pathology: Dysphagia Treatment Note  Patient was observed with : regular and Thin liquids.  Patient was noted to have s/s of aspiration : Yes  Lung Sounds: diminished lower, clear upper Temperature:  99.4  Patient required:  min cues to consistently follow precautions/strategies  Clinical Impression:  Observed pt. with cup and straw sips of thin liquid with one delayed cough (expectorated mucous) which could represent decreased airway protection.  Pt. and husband report coughing without po's and occasionally during meals.  She prefers to use the straw due to difficulty coordinating with cervical collar.  SLP cued her to take small, single sips and discussed importance of swallow precautions.  Oral manipulation with cracker was WFL's and diet upgraded to regular texture.  Recommendations:  Upgraded diet texture to regular, continue thin liquids with small sips   Pain:   none Intervention Required:   No   Goals: Goals Partially Met  Royce Macadamia M.Minda Meo Pager [phone]  07/17/2011'

## 2011-07-17 NOTE — Progress Notes (Signed)
[name] with [facility] fit a left hinged knee brace with full ROM and a right knight splint set in 90. Patient advised by Trey Paula that they will increase extension ROM on right lower extremity soon. Please call Trey Paula if needed at [phone].
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
## 2011-07-17 NOTE — Progress Notes (Signed)
Nutrition Follow-up  Diet Order:  Dysphagia 3, thin liquids PO intake: 10% at 2 meals since TF ended on 12/22 Prostat TID, provides 216 kcal, 45 gm protein  Meds: Scheduled Meds:   . antiseptic oral rinse  15 mL Mouth Rinse QID  . benazepril  5 mg Per NG tube Daily  . chlorhexidine  15 mL Mouth Rinse BID  . feeding supplement  30 mL Per Tube TID  . ferrous sulfate  300 mg Per Tube TID  . insulin aspart  0-10 Units Subcutaneous Q4H  . insulin glargine  15 Units Subcutaneous Q24H  . metoprolol tartrate  25 mg Per NG tube Daily  . nystatin   Topical BID  . pantoprazole sodium  40 mg Per Tube Q1200  . sodium chloride  10 mL Intracatheter Q12H  . vitamin e  400 Units Per Tube TID   Continuous Infusions:   . 0.9 % NaCl with KCl 20 mEq / L 50 mL/hr at 07/17/11 0800  . midazolam (VERSED) infusion Stopped (07/15/11 0800)   PRN Meds:.acetaminophen (TYLENOL) oral liquid 160 mg/5 mL, HYDROcodone-acetaminophen, HYDROmorphone (DILAUDID) injection, levalbuterol, midazolam, ondansetron (ZOFRAN) IV, ondansetron, sodium chloride  Labs:  CMP     Component Value Date/Time   NA 136 07/16/2011 0620   K 4.0 07/16/2011 0620   CL 100 07/16/2011 0620   CO2 28 07/16/2011 0620   GLUCOSE 134* 07/16/2011 0620   BUN 8 07/16/2011 0620   CREATININE 0.36* 07/16/2011 0620   CALCIUM 8.5 07/16/2011 0620   PROT 6.1 06/30/2011 2040   ALBUMIN 3.5 06/30/2011 2040   AST 223* 06/30/2011 2040   ALT 209* 06/30/2011 2040   ALKPHOS 46 06/30/2011 2040   BILITOT 0.2* 06/30/2011 2040   GFRNONAA >90 07/16/2011 0620   GFRAA >90 07/16/2011 0620     Intake/Output Summary (Last 24 hours) at 07/17/11 1031 Last data filed at 07/17/11 0800  Gross per 24 hour  Intake    780 ml  Output   2668 ml  Net  -1888 ml   Tube feedings ended on 12/22. Patient reports that the thought of eating makes her feel nauseas. Patient states that she is tolerating liquids a little better then other foods, but mostly eating very limited amounts.  Patient stated that she is trying different foods, fruit/yogurt/grilled cheese to increase PO intake, patient aware of importance of good PO intake. Patient does not wish to start supplements at this time.     Weight Status:  227 lbs (103.2 kg) trending down likely related to negative fluid balance as well as limited PO intake.   Nutrition Dx:  Inadequate oral intake, ongoing  Goal:  Prior goal for TF no longer relevant New Goal: PO intake to be >75% at all meals.  Intervention:   1. Patient encouraged to request food items she will tolerate best.  2. RD to follow for additional needs/supplements  Monitor:  PO intake, weight trend, labs, I/O's    Rudean Haskell Pager #:  [phone]

## 2011-07-17 NOTE — Progress Notes (Signed)
Some chest wall soreness during cough. ADV diet and transfer to floor. Patient examined and I agree with the assessment and plan  Tammie Yanda E

## 2011-07-18 LAB — URINE CULTURE: Colony Count: 1000

## 2011-07-18 LAB — GLUCOSE, CAPILLARY
Glucose-Capillary: 117 mg/dL — ABNORMAL HIGH (ref 70–99)
Glucose-Capillary: 127 mg/dL — ABNORMAL HIGH (ref 70–99)

## 2011-07-18 MED ORDER — SODIUM CHLORIDE 0.9 % IJ SOLN
10.0000 mL | INTRAMUSCULAR | Status: DC | PRN
  Administered 2011-07-18 (×2): 10 mL
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
## 2011-07-19 LAB — GLUCOSE, CAPILLARY
Glucose-Capillary: 106 mg/dL — ABNORMAL HIGH (ref 70–99)
Glucose-Capillary: 113 mg/dL — ABNORMAL HIGH (ref 70–99)
Glucose-Capillary: 113 mg/dL — ABNORMAL HIGH (ref 70–99)
Glucose-Capillary: 125 mg/dL — ABNORMAL HIGH (ref 70–99)
Glucose-Capillary: 130 mg/dL — ABNORMAL HIGH (ref 70–99)

## 2011-07-19 MED ORDER — SITAGLIPTIN PHOS-METFORMIN HCL 50-500 MG PO TABS
1.0000 | ORAL_TABLET | Freq: Two times a day (BID) | ORAL | Status: DC
  Administered 2011-07-19 – 2011-07-24 (×9): 1 via ORAL
  Filled 2011-07-19: qty 1

## 2011-07-19 MED ORDER — NON FORMULARY: Freq: Two times a day (BID) | Status: DC

## 2011-07-19 NOTE — Progress Notes (Signed)
Long discussion with pt, husband and parents along with PA.  Pt asking for info on both options, home with HH/DME vs SNF.  Advised her that I had spoken with the [employer] rep about inpatient rehab and that it was not an option since she can't bear any weight in both lower extremities for 8 weeks. Pt stressed that her SNF preference is [facility] in [place].  Advised pt and family that we can't promise that [facility] has any availability but that the CSW would send out information to facilities in [place] and [facility] and that all facilities with availability would respond and that she would choose from those facilities. Pt stated understanding and gave her verbal permission for the CSW to complete the FL2 and fax it out.   Pt also asked for insurance info on cost of home vs SNF.  Spoke with Bertram [place], hospital [facility] rep. She stated that the coverage for SNF was 100%, no copays. The only cost she noted was that there would be a 100$ one time payment for nonemergent transport to MD appointments and that those would be covered 100% afterwards. She saw no restrictions against private rooms in the SNF policy.  The costs for home with HH/DME were also 100% coverage for both DME and HHPT and/or HHRN.  There would however be no coverage for any medical transport from home to MD appointments.   Pt's out of pocket maximum is $500 per person which she will easily have surpassed with this admission.

## 2011-07-19 NOTE — Progress Notes (Signed)
Occupational Therapy Treatment Patient Details Name: [name] MRN: [id number] DOB: Dec 01, 1965 Today's Date: 07/19/2011  OT Assessment/Plan OT Assessment/Plan Comments on Treatment Session: Pt and family are agreeable to d/c to SNF.  OT in agreement with this plan. Have upgraded goals to reflect d/c plan change. Pt very nervous about maxi lift as pt had used the sky lift previously.  Pt did well with the maxi lift but still prefers the sky lift. OT Plan: Discharge plan needs to be updated OT Frequency: Min 2X/week Follow Up Recommendations: 24 hour supervision/assistance Equipment Recommended: Defer to next venue OT Goals Acute Rehab OT Goals OT Goal Formulation: With patient/family Time For Goal Achievement: 7 days ADL Goals Pt Will Perform Grooming: with set-up;Sitting, edge of bed;Unsupported ADL Goal: Grooming - Progress: Revised (modified due to lack of progress/goal met) Pt Will Transfer to Toilet: with 2+ total assist;Anterior-posterior transfer;3-in-1;Maintaining weight bearing status ADL Goal: Toilet Transfer - Progress: Not met Additional ADL Goal #1: Pt will perform bed mobilty with min A in prep for EOB ADLs. ADL Goal: Additional Goal #1 - Progress: Revised (modified due to lack of progress/goal met) Miscellaneous OT Goals Miscellaneous OT Goal #1: Pt's family will be I with use of hoyer lift. OT Goal: Miscellaneous Goal #1 - Progress: Discontinued (comment) (Discontinued to d/c plan change)  OT Treatment Precautions/Restrictions  Precautions Precautions: Posterior Hip (LLE) Precaution Booklet Issued:  (Handout posted in room) Precaution Comments: Cervical. Also restrictions for weight bearing left hand secondary to left MC fracture and splint Cervical Brace: Hard collar Other Brace/Splint: Night splint right ankle, left hinged knee brace with full ROM Restrictions Weight Bearing Restrictions: Yes LUE Weight Bearing: Non weight bearing RLE Weight Bearing: Non  weight bearing . LLE Weight Bearing: Non weight bearing  Other Position/Activity Restrictions: ROM as tolerated right/left knee and ankle   ADL ADL Grooming: Simulated;Set up Grooming Details (indicate cue type and reason): Using RUE--not her dominant Where Assessed - Grooming: Sitting, chair;Supported Mobility  Bed Mobility Bed Mobility: Yes Rolling Right: 3: Mod assist  Rolling Right Details (indicate cue type and reason): cues to keep from using Left Hand asssit on rail; Pillow placed between knees for hip prec  Rolling Left: 4: Min assist  Rolling Left Details (indicate cue type and reason): cues to keep from using L hand assist; Pillow placed between knees for hip prec  Exercises    End of Session OT - End of Session Equipment Utilized During Treatment: Cervical collar;Other (comment) (LLE hinged brace) Activity Tolerance: Patient limited by pain Patient left: in chair;with call bell in reach;with family/visitor present Nurse Communication: Other (comment) (Communicated to RN pt's preference for a sky lift room) General Behavior During Session: [facility] for tasks performed (Simultaneous filing. User may not have seen previous data.) Cognition: [facility] for tasks performed (Simultaneous filing. User may not have seen previous data.)  Cipriano Mile  07/19/2011, 1:02 PM 07/19/2011 Cipriano Mile OTR/L Pager [phone] Office [phone]
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
## 2011-07-19 NOTE — Progress Notes (Signed)
Physical Therapy Treatment Patient Details Name: [name] MRN: [id number] DOB: 19-Oct-1965 Today's Date: 07/19/2011  PT Assessment/Plan  PT - Assessment/Plan Comments on Treatment Session: Discussed dc plans with PA; Parents very nervous re: going home;  Pt and family are leaning twoards dc to SNF for rehab; PT in agreement; will amend dc plan PT Plan: Discharge plan needs to be updated;Frequency needs to be updated;Equipment recommendations need to be updated PT Frequency: Min 3X/week Follow Up Recommendations: Skilled nursing facility Equipment Recommended: Defer to next venue PT Goals  Acute Rehab PT Goals Pt will Roll Supine to Right Side: with supervision PT Goal: Rolling Supine to Right Side - Progress: Progressing toward goal Pt will Roll Supine to Left Side: with supervision (and correct posterior hip prec) PT Goal: Rolling Supine to Left Side - Progress: Progressing toward goal Pt will go Supine/Side to Sit: with mod assist;with HOB not 0 degrees (comment degree) PT Goal: Supine/Side to Sit - Progress: Other (comment) Pt will Transfer Bed to Chair/Chair to Bed: with +2 total assist (via anterior post; or sliding board transfer) PT Transfer Goal: Bed to Chair/Chair to Bed - Progress: Other (comment) Pt will Go Up / Down Stairs: 1-2 stairs;with +2 total assist PT Goal: Up/Down Stairs - Progress: Revised (modified due to lack of progress/goal met) Pt will Perform Home Exercise Program: with min assist PT Goal: Perform Home Exercise Program - Progress: Progressing toward goal Pt will Propel Wheelchair: 51 - 150 feet;with supervision PT Goal: Propel Wheelchair - Progress: Other (comment) Additional Goals Additional Goal #1: Family will perform safe hoyer lift to wheelchair with minimal assistance/cues PT Goal: Additional Goal #1 - Progress: Revised (modified due to lack of progress/goal met)  PT Treatment Precautions/Restrictions  Precautions Precautions: Posterior Hip  (LLE) Precaution Booklet Issued:  (Handout posted in room) Precaution Comments: Cervical. Also restrictions for weight bearing left hand secondary to left MC fracture and splint Required Braces or Orthoses: Yes Cervical Brace: Hard collar Other Brace/Splint: Night splint right ankle, left hinged knee brace with full ROM Restrictions Weight Bearing Restrictions: Yes LUE Weight Bearing: Non weight bearing (Simultaneous filing. User may not have seen previous data.) RLE Weight Bearing: Non weight bearing (Simultaneous filing. User may not have seen previous data.) LLE Weight Bearing: Non weight bearing (Simultaneous filing. User may not have seen previous data.) Other Position/Activity Restrictions: ROM as tolerated right/left knee and ankle Mobility (including Balance) Bed Mobility Bed Mobility: Yes Rolling Right: 3: Mod assist (Simultaneous filing. User may not have seen previous data.) Rolling Right Details (indicate cue type and reason): cues to keep from using Left Hand asssit on rail; Pillow placed between knees for hip prec (Simultaneous filing. User may not have seen previous data.) Rolling Left: 4: Min assist (Simultaneous filing. User may not have seen previous data.) Rolling Left Details (indicate cue type and reason): cues to keep from using L hand assist; Pillow placed between knees for hip prec (Simultaneous filing. User may not have seen previous data.) Transfers Transfers: Yes Transfer via Lift Equipment: Maximove (with plus 2 assist, support given to L lower leg) Ambulation/Gait Ambulation/Gait: No       End of Session PT - End of Session Equipment Utilized During Treatment: Cervical collar Activity Tolerance: Patient tolerated treatment well Patient left: in chair;with call bell in reach;with family/visitor present Nurse Communication: Mobility status for transfers General Behavior During Session: [facility] for tasks performed (Simultaneous filing. User may not have seen  previous data.) Cognition: [facility] for tasks performed (Simultaneous  filing. User may not have seen previous data.)  Van Clines [facility] [place], [place] [phone]  07/19/2011, 12:28 PM

## 2011-07-19 NOTE — Progress Notes (Signed)
Speech Language/Pathology SLP orders discontinued by MD. Last visit by SLP indicated minimal concern for aspiration. Aspiration precautions and small sips emphasized. SLP cannot continue treatment unless active order present.  Please reorder if any concern regarding safety swallowing arises. Harlon Ditty, MA CCC-SLP [phone]

## 2011-07-19 NOTE — Progress Notes (Signed)
Likely SNF placement [name]

## 2011-07-20 LAB — GLUCOSE, CAPILLARY
Glucose-Capillary: 128 mg/dL — ABNORMAL HIGH (ref 70–99)
Glucose-Capillary: 149 mg/dL — ABNORMAL HIGH (ref 70–99)

## 2011-07-20 MED ORDER — SULFAMETHOXAZOLE-TMP DS 800-160 MG PO TABS
1.0000 | ORAL_TABLET | Freq: Two times a day (BID) | ORAL | Status: DC
  Administered 2011-07-20 – 2011-07-24 (×9): 1 via ORAL
  Filled 2011-07-20 (×10): qty 1
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
## 2011-07-20 NOTE — Progress Notes (Signed)
CSW gave bed offers for patient.  Patient extremely emotional about leaving the hospital, reporting she is not medically ready and cannot go.  CSW provided emotional support as well as education about SNF and rehab.  Patient made first choice [facility].  [facility] contacted and reports no beds.  This was explained repeatedly to the patient, but her impression is there is a bed next week.  Second choice is [facility] and they are verifying insurance, in which we should have an answer soon.  Third Choice is [facility], where she has been accepted and has a bed.  All family in the room, CSW spent a great deal of time with them as well explaining and educating role of CSW and referral process and need for planning and picking a bed once bed offers have been given.  Will follow up.  Ashley Jacobs, MSW LCSW [phone]  For Macario Golds

## 2011-07-20 NOTE — Progress Notes (Signed)
Nutrition Follow-up  Diet Order:  Carbohydrate Modified Medium Calorie. PO intake 100% per flowsheet records.  Meds: Scheduled Meds:   . aspirin  162 mg Oral Daily  . benazepril  5 mg Oral Daily  . ferrous sulfate  300 mg Oral TID  . insulin glargine  10 Units Subcutaneous Q24H  . metoprolol tartrate  25 mg Oral BID  . pantoprazole sodium  40 mg Oral Q1200  . sitaGLIPtan-metformin  1 tablet Oral BID WC  . sulfamethoxazole-trimethoprim  1 tablet Oral Q12H  . vitamin E  400 Units Oral TID   Continuous Infusions:  PRN Meds:.ALPRAZolam, HYDROcodone-acetaminophen, HYDROmorphone (DILAUDID) injection, nitroGLYCERIN, sodium chloride  Labs:  CMP     Component Value Date/Time   NA 136 07/16/2011 0620   K 4.0 07/16/2011 0620   CL 100 07/16/2011 0620   CO2 28 07/16/2011 0620   GLUCOSE 134* 07/16/2011 0620   BUN 8 07/16/2011 0620   CREATININE 0.36* 07/16/2011 0620   CALCIUM 8.5 07/16/2011 0620   PROT 6.1 06/30/2011 2040   ALBUMIN 3.5 06/30/2011 2040   AST 223* 06/30/2011 2040   ALT 209* 06/30/2011 2040   ALKPHOS 46 06/30/2011 2040   BILITOT 0.2* 06/30/2011 2040   GFRNONAA >90 07/16/2011 0620   GFRAA >90 07/16/2011 0620     Intake/Output Summary (Last 24 hours) at 07/20/11 1549 Last data filed at 07/20/11 1546  Gross per 24 hour  Intake    360 ml  Output   2150 ml  Net  -1790 ml    Weight Status:  103 kg (12/24) -- no new weight  Nutrition Dx:  Inadequate Oral Intake, resolved  Goal:  Pt to consume >75% of meals, met Monitor: PO intake, labs, weight, I/O's  Intervention:    No nutrition intervention warranted at this time  RD to continue to follow   Alger Memos Pager #:  [phone]

## 2011-07-20 NOTE — Progress Notes (Signed)
Pt has bursted cyst on lower sacral area that she burst on 07/20/11 at approx 0400 with moderate amt of serosang. Drainage. Says she does this to this area approx every 2-3 mths and her MD is aware. Cleaned and ABD applied

## 2011-07-21 LAB — GLUCOSE, CAPILLARY: Glucose-Capillary: 140 mg/dL — ABNORMAL HIGH (ref 70–99)

## 2011-07-21 NOTE — Progress Notes (Signed)
Clinical Social Worker (CSW) continuing to follow patient for discharge planning needs.  CSW spoke with [facility] who has agreed to make a bed offer and file with patient [employer].  Clinical Social Worker notified patient and family of bed availability at [facility].  Dr. Carola Frost is to see patient on Monday and then patient to discharge to [facility].  CSW notified covering CSW of patient discharge plans for Monday December 31st.  MD notified and agreeable with plan.  Clinical Social Worker to facilitate patient discharge needs and arrange transportation.  [address] [place], [place] [phone]
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
## 2011-07-21 NOTE — Progress Notes (Signed)
I saw the patient, participated in the history, exam and medical decision making, and concur with the physician assistant's note above.  Renalda Locklin M. Gabrielle Wakeland, MD, FACS General, Bariatric, & Minimally Invasive Surgery [facility]   
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
## 2011-07-22 LAB — GLUCOSE, CAPILLARY
Glucose-Capillary: 151 mg/dL — ABNORMAL HIGH (ref 70–99)
Glucose-Capillary: 158 mg/dL — ABNORMAL HIGH (ref 70–99)
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
## 2011-07-22 NOTE — Progress Notes (Signed)
I have seen and examined the patient and agree with the assessment and plans.  Doristine Shehan A. Zackeriah Kissler  MD, FACS  

## 2011-07-23 LAB — GLUCOSE, CAPILLARY: Glucose-Capillary: 146 mg/dL — ABNORMAL HIGH (ref 70–99)
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
## 2011-07-23 NOTE — Progress Notes (Signed)
I saw the patient, participated in the history, exam and medical decision making, and concur with the physician assistant's note above.  Dula Havlik M. Paublo Warshawsky, MD, FACS General, Bariatric, & Minimally Invasive Surgery [facility]   

## 2011-07-24 ENCOUNTER — Inpatient Hospital Stay
Admission: RE | Admit: 2011-07-24 | Discharge: 2011-10-07 | Disposition: A | Discharge status: Home / Self Care | Payer: 59 | Source: Ambulatory Visit | Attending: Pulmonary Disease | Admitting: Pulmonary Disease

## 2011-07-24 DIAGNOSIS — R51 Headache: Secondary | ICD-10-CM

## 2011-07-24 DIAGNOSIS — E119 Type 2 diabetes mellitus without complications: Secondary | ICD-10-CM | POA: Insufficient documentation

## 2011-07-24 DIAGNOSIS — R42 Dizziness and giddiness: Principal | ICD-10-CM

## 2011-07-24 DIAGNOSIS — L0591 Pilonidal cyst without abscess: Secondary | ICD-10-CM | POA: Diagnosis not present

## 2011-07-24 DIAGNOSIS — S83209A Unspecified tear of unspecified meniscus, current injury, unspecified knee, initial encounter: Secondary | ICD-10-CM

## 2011-07-24 MED ORDER — ALPRAZOLAM 0.5 MG PO TABS: 0.5000 mg | ORAL_TABLET | Freq: Two times a day (BID) | ORAL | Status: AC | PRN

## 2011-07-24 MED ORDER — HYDROCODONE-ACETAMINOPHEN 5-325 MG PO TABS: 1.0000 | ORAL_TABLET | ORAL | Status: AC | PRN

## 2011-07-24 MED ORDER — INSULIN GLARGINE 100 UNIT/ML ~~LOC~~ SOLN: 10.0000 [IU] | SUBCUTANEOUS | Status: DC

## 2011-07-24 MED ORDER — SULFAMETHOXAZOLE-TMP DS 800-160 MG PO TABS: 1.0000 | ORAL_TABLET | Freq: Two times a day (BID) | ORAL | Status: AC
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
## 2011-07-24 NOTE — Discharge Summary (Signed)
This patient has been seen and I agree with the findings and treatment plan.  Zyanne Schumm O. Juancarlos Crescenzo, III, MD, FACS [phone] (pager) [phone] (direct pager) Trauma Surgeon  

## 2011-07-24 NOTE — Progress Notes (Signed)
Orthopedic Tech Progress Note Patient Details:  [name] 1965-10-24 [id number]  Other Ortho Devices Type of Ortho Device: Other (comment) Ortho Device Location: applied Prafo to right foot   [name] 07/24/2011, 11:37 AM
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
## 2011-07-24 NOTE — Progress Notes (Signed)
Occupational Therapy Treatment Patient Details Name: [name] MRN: [id number] DOB: 03-Dec-1965 Today's Date: 07/24/2011 Pain - 2/10 at rest. No meds requested at this time. Time:0830 - 0920 50 min   OT Assessment/Plan Discussed POC and D/C plans. Educated pt/pt's husband on place and hold exercises for L hand within pain tolerance. Also educated in intrinsic exercises. Pt/family able to return demonstrate. 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  Contacted PA - he discussed this with pt.   OT Goals New goal:  Pt/family independent with written HEP for L hand and precautions. Goal met   OT Treatment Precautions/Restrictions NWB BLE Wear splint L hand when transfering       Exercises  See above [facility] [name] OT [phone]    End of Session - pt tolerated session well. Anticipated D/C to SNF   [facility]  07/24/2011, 9:35 AM
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
## 2011-07-24 NOTE — Progress Notes (Signed)
This patient has been seen and I agree with the findings and treatment plan.  Norman Bier O. Oona Trammel, III, MD, FACS [phone] (pager) [phone] (direct pager) Trauma Surgeon  

## 2011-07-26 LAB — GLUCOSE, CAPILLARY
Glucose-Capillary: 157 mg/dL — ABNORMAL HIGH (ref 70–99)
Glucose-Capillary: 167 mg/dL — ABNORMAL HIGH (ref 70–99)
Glucose-Capillary: 164 mg/dL — ABNORMAL HIGH (ref 70–99)

## 2011-07-27 LAB — GLUCOSE, CAPILLARY: Glucose-Capillary: 113 mg/dL — ABNORMAL HIGH (ref 70–99)

## 2011-07-27 NOTE — Progress Notes (Signed)
Pt was reviewed in LOS meeting on 07/20/2011. UR of chart completed retroactively.

## 2011-07-28 LAB — GLUCOSE, CAPILLARY
Glucose-Capillary: 152 mg/dL — ABNORMAL HIGH (ref 70–99)
Glucose-Capillary: 133 mg/dL — ABNORMAL HIGH (ref 70–99)

## 2011-07-29 LAB — GLUCOSE, CAPILLARY: Glucose-Capillary: 129 mg/dL — ABNORMAL HIGH (ref 70–99)

## 2011-07-30 LAB — GLUCOSE, CAPILLARY: Glucose-Capillary: 137 mg/dL — ABNORMAL HIGH (ref 70–99)

## 2011-07-31 LAB — GLUCOSE, CAPILLARY: Glucose-Capillary: 189 mg/dL — ABNORMAL HIGH (ref 70–99)

## 2011-08-01 LAB — GLUCOSE, CAPILLARY
Glucose-Capillary: 136 mg/dL — ABNORMAL HIGH (ref 70–99)
Glucose-Capillary: 155 mg/dL — ABNORMAL HIGH (ref 70–99)

## 2011-08-02 LAB — GLUCOSE, CAPILLARY: Glucose-Capillary: 117 mg/dL — ABNORMAL HIGH (ref 70–99)

## 2011-08-04 LAB — GLUCOSE, CAPILLARY: Glucose-Capillary: 176 mg/dL — ABNORMAL HIGH (ref 70–99)

## 2011-08-09 ENCOUNTER — Other Ambulatory Visit (HOSPITAL_COMMUNITY): Payer: No Typology Code available for payment source

## 2011-08-09 LAB — GLUCOSE, CAPILLARY: Glucose-Capillary: 129 mg/dL — ABNORMAL HIGH (ref 70–99)

## 2011-08-10 ENCOUNTER — Other Ambulatory Visit: Payer: Self-pay | Admitting: *Deleted

## 2011-08-10 MED ORDER — METOPROLOL TARTRATE 25 MG PO TABS: 25.0000 mg | ORAL_TABLET | Freq: Every day | ORAL | Status: DC

## 2011-08-11 LAB — GLUCOSE, CAPILLARY
Glucose-Capillary: 130 mg/dL — ABNORMAL HIGH (ref 70–99)
Glucose-Capillary: 175 mg/dL — ABNORMAL HIGH (ref 70–99)

## 2011-08-14 LAB — GLUCOSE, CAPILLARY: Glucose-Capillary: 139 mg/dL — ABNORMAL HIGH (ref 70–99)

## 2011-08-15 ENCOUNTER — Other Ambulatory Visit (HOSPITAL_COMMUNITY): Payer: No Typology Code available for payment source

## 2011-08-16 LAB — GLUCOSE, CAPILLARY: Glucose-Capillary: 134 mg/dL — ABNORMAL HIGH (ref 70–99)

## 2011-08-18 LAB — GLUCOSE, CAPILLARY: Glucose-Capillary: 172 mg/dL — ABNORMAL HIGH (ref 70–99)

## 2011-08-21 ENCOUNTER — Ambulatory Visit (HOSPITAL_COMMUNITY)
Admit: 2011-08-21 | Discharge: 2011-08-21 | Disposition: A | Discharge status: Home / Self Care | Payer: 59 | Source: Ambulatory Visit | Attending: Neurosurgery | Admitting: Neurosurgery

## 2011-08-21 DIAGNOSIS — R51 Headache: Secondary | ICD-10-CM | POA: Insufficient documentation

## 2011-08-21 DIAGNOSIS — R42 Dizziness and giddiness: Secondary | ICD-10-CM | POA: Insufficient documentation

## 2011-08-21 DIAGNOSIS — Z87820 Personal history of traumatic brain injury: Secondary | ICD-10-CM | POA: Insufficient documentation

## 2011-08-21 LAB — GLUCOSE, CAPILLARY: Glucose-Capillary: 117 mg/dL — ABNORMAL HIGH (ref 70–99)

## 2011-08-22 ENCOUNTER — Ambulatory Visit (HOSPITAL_COMMUNITY)
Admit: 2011-08-22 | Discharge: 2011-08-22 | Disposition: A | Discharge status: Home / Self Care | Payer: 59 | Source: Ambulatory Visit | Attending: Orthopedic Surgery | Admitting: Orthopedic Surgery

## 2011-08-22 DIAGNOSIS — S82109A Unspecified fracture of upper end of unspecified tibia, initial encounter for closed fracture: Secondary | ICD-10-CM | POA: Insufficient documentation

## 2011-08-22 DIAGNOSIS — M25569 Pain in unspecified knee: Secondary | ICD-10-CM | POA: Insufficient documentation

## 2011-08-22 DIAGNOSIS — S83419A Sprain of medial collateral ligament of unspecified knee, initial encounter: Secondary | ICD-10-CM | POA: Insufficient documentation

## 2011-08-22 DIAGNOSIS — X58XXXA Exposure to other specified factors, initial encounter: Secondary | ICD-10-CM | POA: Insufficient documentation

## 2011-08-22 LAB — GLUCOSE, CAPILLARY: Glucose-Capillary: 136 mg/dL — ABNORMAL HIGH (ref 70–99)

## 2011-08-23 LAB — GLUCOSE, CAPILLARY: Glucose-Capillary: 108 mg/dL — ABNORMAL HIGH (ref 70–99)

## 2011-08-24 LAB — GLUCOSE, CAPILLARY: Glucose-Capillary: 157 mg/dL — ABNORMAL HIGH (ref 70–99)

## 2011-08-25 LAB — GLUCOSE, CAPILLARY: Glucose-Capillary: 116 mg/dL — ABNORMAL HIGH (ref 70–99)

## 2011-08-25 DEATH — deceased

## 2011-08-30 LAB — GLUCOSE, CAPILLARY: Glucose-Capillary: 198 mg/dL — ABNORMAL HIGH (ref 70–99)

## 2011-09-01 LAB — GLUCOSE, CAPILLARY
Glucose-Capillary: 117 mg/dL — ABNORMAL HIGH (ref 70–99)
Glucose-Capillary: 139 mg/dL — ABNORMAL HIGH (ref 70–99)

## 2011-09-04 LAB — GLUCOSE, CAPILLARY
Glucose-Capillary: 128 mg/dL — ABNORMAL HIGH (ref 70–99)
Glucose-Capillary: 155 mg/dL — ABNORMAL HIGH (ref 70–99)

## 2011-09-06 LAB — GLUCOSE, CAPILLARY: Glucose-Capillary: 140 mg/dL — ABNORMAL HIGH (ref 70–99)

## 2011-09-08 LAB — GLUCOSE, CAPILLARY: Glucose-Capillary: 155 mg/dL — ABNORMAL HIGH (ref 70–99)

## 2011-09-11 LAB — GLUCOSE, CAPILLARY: Glucose-Capillary: 167 mg/dL — ABNORMAL HIGH (ref 70–99)

## 2011-09-15 LAB — GLUCOSE, CAPILLARY: Glucose-Capillary: 119 mg/dL — ABNORMAL HIGH (ref 70–99)

## 2011-09-18 ENCOUNTER — Inpatient Hospital Stay
Admit: 2011-09-18 | Discharge: 2011-09-18 | Disposition: A | Discharge status: Home / Self Care | Payer: 59 | Attending: Neurosurgery | Admitting: Neurosurgery

## 2011-09-18 ENCOUNTER — Inpatient Hospital Stay: Admit: 2011-09-18 | Payer: 59

## 2011-09-19 LAB — GLUCOSE, CAPILLARY: Glucose-Capillary: 148 mg/dL — ABNORMAL HIGH (ref 70–99)

## 2011-09-20 LAB — GLUCOSE, CAPILLARY: Glucose-Capillary: 133 mg/dL — ABNORMAL HIGH (ref 70–99)

## 2011-09-22 ENCOUNTER — Other Ambulatory Visit: Payer: 59

## 2011-09-22 LAB — GLUCOSE, CAPILLARY
Glucose-Capillary: 118 mg/dL — ABNORMAL HIGH (ref 70–99)
Glucose-Capillary: 155 mg/dL — ABNORMAL HIGH (ref 70–99)

## 2011-09-25 LAB — GLUCOSE, CAPILLARY
Glucose-Capillary: 115 mg/dL — ABNORMAL HIGH (ref 70–99)
Glucose-Capillary: 103 mg/dL — ABNORMAL HIGH (ref 70–99)

## 2011-09-29 LAB — GLUCOSE, CAPILLARY: Glucose-Capillary: 126 mg/dL — ABNORMAL HIGH (ref 70–99)

## 2011-10-06 LAB — GLUCOSE, CAPILLARY: Glucose-Capillary: 132 mg/dL — ABNORMAL HIGH (ref 70–99)

## 2011-10-12 ENCOUNTER — Telehealth: Payer: Self-pay | Admitting: Cardiology
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
## 2011-10-16 NOTE — Telephone Encounter (Signed)
Noted  

## 2011-10-18 ENCOUNTER — Ambulatory Visit (HOSPITAL_COMMUNITY)
Admission: RE | Admit: 2011-10-18 | Discharge: 2011-10-18 | Disposition: A | Discharge status: Home / Self Care | Payer: 59 | Source: Ambulatory Visit | Attending: Orthopedic Surgery | Admitting: Orthopedic Surgery

## 2011-10-18 DIAGNOSIS — M25676 Stiffness of unspecified foot, not elsewhere classified: Secondary | ICD-10-CM | POA: Insufficient documentation

## 2011-10-18 DIAGNOSIS — M25673 Stiffness of unspecified ankle, not elsewhere classified: Secondary | ICD-10-CM | POA: Insufficient documentation

## 2011-10-18 DIAGNOSIS — IMO0001 Reserved for inherently not codable concepts without codable children: Secondary | ICD-10-CM | POA: Insufficient documentation

## 2011-10-18 DIAGNOSIS — R262 Difficulty in walking, not elsewhere classified: Secondary | ICD-10-CM | POA: Insufficient documentation

## 2011-10-18 DIAGNOSIS — M25579 Pain in unspecified ankle and joints of unspecified foot: Secondary | ICD-10-CM | POA: Insufficient documentation

## 2011-10-18 DIAGNOSIS — M25669 Stiffness of unspecified knee, not elsewhere classified: Secondary | ICD-10-CM | POA: Insufficient documentation

## 2011-10-18 DIAGNOSIS — E119 Type 2 diabetes mellitus without complications: Secondary | ICD-10-CM | POA: Insufficient documentation

## 2011-10-18 DIAGNOSIS — R29898 Other symptoms and signs involving the musculoskeletal system: Secondary | ICD-10-CM | POA: Insufficient documentation

## 2011-10-18 DIAGNOSIS — M6281 Muscle weakness (generalized): Secondary | ICD-10-CM | POA: Insufficient documentation
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
## 2011-10-18 NOTE — Patient Instructions (Addendum)
HEP given 

## 2011-10-19 ENCOUNTER — Ambulatory Visit (HOSPITAL_COMMUNITY)
Admission: RE | Admit: 2011-10-19 | Discharge: 2011-10-19 | Disposition: A | Discharge status: Home / Self Care | Payer: 59 | Source: Ambulatory Visit | Attending: Family Medicine | Admitting: Family Medicine
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
## 2011-10-24 ENCOUNTER — Ambulatory Visit (HOSPITAL_COMMUNITY)
Admission: RE | Admit: 2011-10-24 | Discharge: 2011-10-24 | Disposition: A | Discharge status: Home / Self Care | Payer: 59 | Source: Ambulatory Visit | Attending: Orthopedic Surgery | Admitting: Orthopedic Surgery

## 2011-10-24 DIAGNOSIS — M25676 Stiffness of unspecified foot, not elsewhere classified: Secondary | ICD-10-CM | POA: Insufficient documentation

## 2011-10-24 DIAGNOSIS — M25669 Stiffness of unspecified knee, not elsewhere classified: Secondary | ICD-10-CM

## 2011-10-24 DIAGNOSIS — R262 Difficulty in walking, not elsewhere classified: Secondary | ICD-10-CM

## 2011-10-24 DIAGNOSIS — M6281 Muscle weakness (generalized): Secondary | ICD-10-CM | POA: Insufficient documentation

## 2011-10-24 DIAGNOSIS — M25673 Stiffness of unspecified ankle, not elsewhere classified: Secondary | ICD-10-CM | POA: Insufficient documentation

## 2011-10-24 DIAGNOSIS — R29898 Other symptoms and signs involving the musculoskeletal system: Secondary | ICD-10-CM

## 2011-10-24 DIAGNOSIS — M25579 Pain in unspecified ankle and joints of unspecified foot: Secondary | ICD-10-CM | POA: Insufficient documentation

## 2011-10-24 DIAGNOSIS — IMO0001 Reserved for inherently not codable concepts without codable children: Secondary | ICD-10-CM | POA: Insufficient documentation

## 2011-10-24 DIAGNOSIS — E119 Type 2 diabetes mellitus without complications: Secondary | ICD-10-CM | POA: Insufficient documentation
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
## 2011-10-26 ENCOUNTER — Encounter: Payer: Self-pay | Admitting: Cardiology

## 2011-10-26 ENCOUNTER — Ambulatory Visit (HOSPITAL_COMMUNITY): Payer: 59 | Admitting: Physical Therapy

## 2011-10-26 ENCOUNTER — Encounter (INDEPENDENT_AMBULATORY_CARE_PROVIDER_SITE_OTHER): Payer: 59

## 2011-10-26 ENCOUNTER — Ambulatory Visit (INDEPENDENT_AMBULATORY_CARE_PROVIDER_SITE_OTHER): Payer: 59 | Admitting: Cardiology

## 2011-10-26 ENCOUNTER — Ambulatory Visit (HOSPITAL_COMMUNITY): Payer: 59

## 2011-10-26 VITALS — BP 110/62 | HR 72 | Resp 18 | Ht 65.0 in | Wt 227.0 lb

## 2011-10-26 DIAGNOSIS — I779 Disorder of arteries and arterioles, unspecified: Secondary | ICD-10-CM

## 2011-10-26 DIAGNOSIS — I2510 Atherosclerotic heart disease of native coronary artery without angina pectoris: Secondary | ICD-10-CM

## 2011-10-26 DIAGNOSIS — I6529 Occlusion and stenosis of unspecified carotid artery: Secondary | ICD-10-CM
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
## 2011-10-26 NOTE — Patient Instructions (Signed)
Your physician wants you to follow-up in: 6 monhs.  You will receive a reminder letter in the mail two months in advance. If you don't receive a letter, please call our office to schedule the follow-up appointment.
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
## 2011-10-26 NOTE — Assessment & Plan Note (Signed)
Coronary disease is stable. No further workup is needed at this time. 

## 2011-10-26 NOTE — Assessment & Plan Note (Signed)
The patient had followup carotid Dopplers today. Fortunately there is been no change. She has 60-79% bilateral disease that can be followed.

## 2011-10-27 ENCOUNTER — Ambulatory Visit (HOSPITAL_COMMUNITY)
Admission: RE | Admit: 2011-10-27 | Discharge: 2011-10-27 | Disposition: A | Discharge status: Home / Self Care | Payer: 59 | Source: Ambulatory Visit | Attending: Physical Therapy | Admitting: Physical Therapy
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
## 2011-10-30 ENCOUNTER — Other Ambulatory Visit (HOSPITAL_COMMUNITY): Payer: Self-pay | Admitting: Orthopedic Surgery

## 2011-10-30 ENCOUNTER — Ambulatory Visit (HOSPITAL_COMMUNITY)
Admission: RE | Admit: 2011-10-30 | Discharge: 2011-10-30 | Disposition: A | Discharge status: Home / Self Care | Payer: 59 | Source: Ambulatory Visit | Attending: Orthopedic Surgery | Admitting: Orthopedic Surgery

## 2011-10-30 ENCOUNTER — Ambulatory Visit (HOSPITAL_COMMUNITY)
Admission: RE | Admit: 2011-10-30 | Discharge: 2011-10-30 | Disposition: A | Discharge status: Home / Self Care | Payer: 59 | Source: Ambulatory Visit | Attending: Family Medicine | Admitting: Family Medicine

## 2011-10-30 DIAGNOSIS — M25569 Pain in unspecified knee: Secondary | ICD-10-CM | POA: Insufficient documentation

## 2011-10-30 DIAGNOSIS — M79643 Pain in unspecified hand: Secondary | ICD-10-CM

## 2011-10-30 DIAGNOSIS — M79609 Pain in unspecified limb: Secondary | ICD-10-CM | POA: Insufficient documentation

## 2011-10-30 DIAGNOSIS — R102 Pelvic and perineal pain: Secondary | ICD-10-CM

## 2011-10-30 DIAGNOSIS — M25559 Pain in unspecified hip: Secondary | ICD-10-CM | POA: Insufficient documentation
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
## 2011-11-01 ENCOUNTER — Other Ambulatory Visit: Payer: Self-pay | Admitting: Orthopedic Surgery

## 2011-11-01 ENCOUNTER — Ambulatory Visit (HOSPITAL_COMMUNITY)
Admission: RE | Admit: 2011-11-01 | Discharge: 2011-11-01 | Disposition: A | Discharge status: Home / Self Care | Payer: 59 | Source: Ambulatory Visit | Attending: Family Medicine | Admitting: Family Medicine

## 2011-11-01 ENCOUNTER — Ambulatory Visit
Admission: RE | Admit: 2011-11-01 | Discharge: 2011-11-01 | Disposition: A | Discharge status: Home / Self Care | Payer: 59 | Source: Ambulatory Visit | Attending: Orthopedic Surgery | Admitting: Orthopedic Surgery

## 2011-11-01 DIAGNOSIS — R52 Pain, unspecified: Secondary | ICD-10-CM
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
## 2011-11-03 ENCOUNTER — Ambulatory Visit (HOSPITAL_COMMUNITY)
Admission: RE | Admit: 2011-11-03 | Discharge: 2011-11-03 | Disposition: A | Discharge status: Home / Self Care | Payer: 59 | Source: Ambulatory Visit | Attending: Physical Therapy | Admitting: Physical Therapy

## 2011-11-03 DIAGNOSIS — R262 Difficulty in walking, not elsewhere classified: Secondary | ICD-10-CM

## 2011-11-03 DIAGNOSIS — M25669 Stiffness of unspecified knee, not elsewhere classified: Secondary | ICD-10-CM

## 2011-11-03 DIAGNOSIS — R29898 Other symptoms and signs involving the musculoskeletal system: Secondary | ICD-10-CM
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
## 2011-11-06 ENCOUNTER — Ambulatory Visit (HOSPITAL_COMMUNITY)
Admission: RE | Admit: 2011-11-06 | Discharge: 2011-11-06 | Disposition: A | Discharge status: Home / Self Care | Payer: 59 | Source: Ambulatory Visit | Attending: *Deleted | Admitting: *Deleted

## 2011-11-06 DIAGNOSIS — M25669 Stiffness of unspecified knee, not elsewhere classified: Secondary | ICD-10-CM

## 2011-11-06 DIAGNOSIS — R29898 Other symptoms and signs involving the musculoskeletal system: Secondary | ICD-10-CM

## 2011-11-06 DIAGNOSIS — R262 Difficulty in walking, not elsewhere classified: Secondary | ICD-10-CM
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
## 2011-11-08 ENCOUNTER — Ambulatory Visit (HOSPITAL_COMMUNITY)
Admission: RE | Admit: 2011-11-08 | Discharge: 2011-11-08 | Disposition: A | Discharge status: Home / Self Care | Payer: 59 | Source: Ambulatory Visit | Attending: Family Medicine | Admitting: Family Medicine

## 2011-11-08 NOTE — Progress Notes (Signed)
Physical Therapy Treatment Patient Details  Name: Diana BUMGARNER MRN: [id number] Date of Birth: 10/25/1965  Today's Date: 11/08/2011 Time: 4098-1191 Time Calculation (min): 68 min Visit#: 9  of 24   Re-eval: 11/17/11 Charges:  Gait 8', therex 40'    Subjective: Symptoms/Limitations Symptoms: Pt. entered clinic today crying/upset regarding MD visit.  Pt. reported Dr. Carola Frost is not pleased with her ROM of L knee and told her she would need surgery.  She states she needs to work hard on regaining her extension prior to surgery.  Pt. also reports MD informed her that her fractures in R LE are not healing. Pain Assessment Currently in Pain?: Yes Pain Score:   4 Pain Location: Leg Pain Orientation: Right;Left   Exercise/Treatments Machines for Strengthening Cybex Knee Extension: 2.5 PL x 20 Cybex Knee Flexion: 4PLx 20 Cybex Leg Press: hold Standing Heel Raises Limitations: hold Functional Squat Limitations: hold Rocker Board: Limitations (hold) Rocker Board Limitations: hold SLS: hold Gait Training: only walker Seated Other Seated Knee Exercises: biodex for PROM x 15 min extension 80%, flexion 76% on biodex Supine Knee Extension: PROM Knee Flexion: PROM Other Supine Knee Exercises: PROM R ankle   Physical Therapy Assessment and Plan PT Assessment and Plan Clinical Impression Statement: Consulted with Annett Fabian, PT re: recent MD visit (per MD non-healing tib/fib fracture on R LE, L knee PCL tear/fracture with need for surgery)  Per PT, hold all closed chained activies, focus on ROM and open chained activites.  Also discussed UE strengthening and  continued ambulation using RW at this point.  To send order over for JAS brace to assist with ROM.   PT Plan: Resume mat activies, adding weights; add UBE and UE strengthening to POC.  Focus on extension more than flexion.     PT - End of Session Equipment Utilized During Treatment: Gait belt Activity Tolerance: Patient tolerated  treatment well General Behavior During Session: [facility] for tasks performed Cognition: [facility] for tasks performed   Huckleberry Martinson B. Bascom Levels, PTA 11/08/2011, 6:10 PM

## 2011-11-10 ENCOUNTER — Ambulatory Visit (HOSPITAL_COMMUNITY)
Admission: RE | Admit: 2011-11-10 | Discharge: 2011-11-10 | Disposition: A | Discharge status: Home / Self Care | Payer: 59 | Source: Ambulatory Visit

## 2011-11-10 DIAGNOSIS — M25669 Stiffness of unspecified knee, not elsewhere classified: Secondary | ICD-10-CM

## 2011-11-10 DIAGNOSIS — R262 Difficulty in walking, not elsewhere classified: Secondary | ICD-10-CM

## 2011-11-10 DIAGNOSIS — R29898 Other symptoms and signs involving the musculoskeletal system: Secondary | ICD-10-CM
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
## 2011-11-13 ENCOUNTER — Ambulatory Visit (HOSPITAL_COMMUNITY)
Admission: RE | Admit: 2011-11-13 | Discharge: 2011-11-13 | Disposition: A | Discharge status: Home / Self Care | Payer: 59 | Source: Ambulatory Visit | Attending: Family Medicine | Admitting: Family Medicine

## 2011-11-13 DIAGNOSIS — R262 Difficulty in walking, not elsewhere classified: Secondary | ICD-10-CM

## 2011-11-13 DIAGNOSIS — R29898 Other symptoms and signs involving the musculoskeletal system: Secondary | ICD-10-CM

## 2011-11-13 DIAGNOSIS — M25669 Stiffness of unspecified knee, not elsewhere classified: Secondary | ICD-10-CM
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
## 2011-11-15 ENCOUNTER — Ambulatory Visit (HOSPITAL_COMMUNITY)
Admission: RE | Admit: 2011-11-15 | Discharge: 2011-11-15 | Disposition: A | Discharge status: Home / Self Care | Payer: 59 | Source: Ambulatory Visit | Attending: Family Medicine | Admitting: Family Medicine
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
## 2011-11-17 ENCOUNTER — Ambulatory Visit (HOSPITAL_COMMUNITY)
Admission: RE | Admit: 2011-11-17 | Discharge: 2011-11-17 | Disposition: A | Discharge status: Home / Self Care | Payer: 59 | Source: Ambulatory Visit | Attending: Family Medicine | Admitting: Family Medicine

## 2011-11-17 DIAGNOSIS — R262 Difficulty in walking, not elsewhere classified: Secondary | ICD-10-CM

## 2011-11-17 DIAGNOSIS — M25669 Stiffness of unspecified knee, not elsewhere classified: Secondary | ICD-10-CM

## 2011-11-17 DIAGNOSIS — R29898 Other symptoms and signs involving the musculoskeletal system: Secondary | ICD-10-CM
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
## 2011-11-21 ENCOUNTER — Ambulatory Visit (HOSPITAL_COMMUNITY)
Admission: RE | Admit: 2011-11-21 | Discharge: 2011-11-21 | Disposition: A | Discharge status: Home / Self Care | Payer: 59 | Source: Ambulatory Visit | Attending: Family Medicine | Admitting: Family Medicine
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
## 2011-11-22 ENCOUNTER — Ambulatory Visit (HOSPITAL_COMMUNITY)
Admission: RE | Admit: 2011-11-22 | Discharge: 2011-11-22 | Disposition: A | Discharge status: Home / Self Care | Payer: 59 | Source: Ambulatory Visit | Attending: Orthopedic Surgery | Admitting: Orthopedic Surgery

## 2011-11-22 DIAGNOSIS — M25673 Stiffness of unspecified ankle, not elsewhere classified: Secondary | ICD-10-CM | POA: Insufficient documentation

## 2011-11-22 DIAGNOSIS — M25669 Stiffness of unspecified knee, not elsewhere classified: Secondary | ICD-10-CM

## 2011-11-22 DIAGNOSIS — M25676 Stiffness of unspecified foot, not elsewhere classified: Secondary | ICD-10-CM | POA: Insufficient documentation

## 2011-11-22 DIAGNOSIS — M6281 Muscle weakness (generalized): Secondary | ICD-10-CM | POA: Insufficient documentation

## 2011-11-22 DIAGNOSIS — M25579 Pain in unspecified ankle and joints of unspecified foot: Secondary | ICD-10-CM | POA: Insufficient documentation

## 2011-11-22 DIAGNOSIS — R262 Difficulty in walking, not elsewhere classified: Secondary | ICD-10-CM

## 2011-11-22 DIAGNOSIS — IMO0001 Reserved for inherently not codable concepts without codable children: Secondary | ICD-10-CM | POA: Insufficient documentation

## 2011-11-22 DIAGNOSIS — R29898 Other symptoms and signs involving the musculoskeletal system: Secondary | ICD-10-CM

## 2011-11-22 DIAGNOSIS — E119 Type 2 diabetes mellitus without complications: Secondary | ICD-10-CM | POA: Insufficient documentation
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
## 2011-11-27 ENCOUNTER — Ambulatory Visit (HOSPITAL_COMMUNITY)
Admission: RE | Admit: 2011-11-27 | Discharge: 2011-11-27 | Disposition: A | Discharge status: Home / Self Care | Payer: 59 | Source: Ambulatory Visit | Attending: Orthopedic Surgery | Admitting: Orthopedic Surgery
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
## 2011-11-28 ENCOUNTER — Telehealth (HOSPITAL_COMMUNITY): Payer: Self-pay | Admitting: *Deleted

## 2011-11-28 ENCOUNTER — Ambulatory Visit (HOSPITAL_COMMUNITY): Payer: 59 | Admitting: *Deleted

## 2011-11-29 ENCOUNTER — Ambulatory Visit (HOSPITAL_COMMUNITY): Payer: 59 | Admitting: *Deleted

## 2011-11-30 ENCOUNTER — Ambulatory Visit (HOSPITAL_COMMUNITY)
Admission: RE | Admit: 2011-11-30 | Discharge: 2011-11-30 | Disposition: A | Discharge status: Home / Self Care | Payer: 59 | Source: Ambulatory Visit | Attending: Orthopaedic Surgery | Admitting: Orthopaedic Surgery

## 2011-11-30 ENCOUNTER — Other Ambulatory Visit (HOSPITAL_COMMUNITY): Payer: Self-pay | Admitting: Orthopaedic Surgery

## 2011-11-30 ENCOUNTER — Ambulatory Visit (HOSPITAL_COMMUNITY)
Admission: RE | Admit: 2011-11-30 | Discharge: 2011-11-30 | Disposition: A | Discharge status: Home / Self Care | Payer: 59 | Source: Ambulatory Visit | Attending: Family Medicine | Admitting: Family Medicine

## 2011-11-30 DIAGNOSIS — M79609 Pain in unspecified limb: Secondary | ICD-10-CM | POA: Insufficient documentation

## 2011-11-30 DIAGNOSIS — M899 Disorder of bone, unspecified: Secondary | ICD-10-CM | POA: Insufficient documentation

## 2011-11-30 DIAGNOSIS — R262 Difficulty in walking, not elsewhere classified: Secondary | ICD-10-CM

## 2011-11-30 DIAGNOSIS — T148XXA Other injury of unspecified body region, initial encounter: Secondary | ICD-10-CM

## 2011-11-30 DIAGNOSIS — Z4789 Encounter for other orthopedic aftercare: Secondary | ICD-10-CM | POA: Insufficient documentation

## 2011-11-30 DIAGNOSIS — M25669 Stiffness of unspecified knee, not elsewhere classified: Secondary | ICD-10-CM

## 2011-11-30 DIAGNOSIS — R29898 Other symptoms and signs involving the musculoskeletal system: Secondary | ICD-10-CM
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
## 2011-12-01 ENCOUNTER — Other Ambulatory Visit (HOSPITAL_COMMUNITY): Payer: 59

## 2011-12-01 ENCOUNTER — Ambulatory Visit (HOSPITAL_COMMUNITY): Payer: 59

## 2011-12-04 ENCOUNTER — Ambulatory Visit (HOSPITAL_COMMUNITY)
Admission: RE | Admit: 2011-12-04 | Discharge: 2011-12-04 | Disposition: A | Discharge status: Home / Self Care | Payer: 59 | Source: Ambulatory Visit | Attending: Family Medicine | Admitting: Family Medicine

## 2011-12-04 DIAGNOSIS — M25669 Stiffness of unspecified knee, not elsewhere classified: Secondary | ICD-10-CM

## 2011-12-04 DIAGNOSIS — R262 Difficulty in walking, not elsewhere classified: Secondary | ICD-10-CM

## 2011-12-04 DIAGNOSIS — R29898 Other symptoms and signs involving the musculoskeletal system: Secondary | ICD-10-CM

## 2011-12-04 NOTE — Progress Notes (Signed)
Physical Therapy Treatment Patient Details  Name: [name] MRN: [id number] Date of Birth: 02/19/1966  Today's Date: 12/04/2011 Time: 1604-1700 PT Time Calculation (min): 56 min Visit#: 18  of 24   Re-eval: 12/15/11 Charges:  therex 42' , manual 8'    Subjective: Symptoms/Limitations Symptoms: Pt. states she is not hurting any worse than normal today. Pain Assessment Currently in Pain?: Yes Pain Score:   2 Pain Location: Leg Pain Orientation: Right;Anterior;Distal   Exercise/Treatments Seated Long Arc Quad: 15 reps;Weights Long Arc Quad Weight: 5 lbs. Other Seated Knee Exercises: biodex PROM R Knee flexion/ext  to 80% X 10 minutes  Supine Quad Sets: 10 reps Heel Slides: 10 reps Knee Extension: PROM Knee Flexion: PROM Prone  Hamstring Curl: 20 reps Hamstring Curl Limitations: L5# R8# Hip Extension: 20 reps Hip Extension Limitations: 5#B Prone Knee Hang: 5 minutes;Weights Prone Knee Hang Weights (lbs): 5   Modalities Modalities: Cryotherapy Cryotherapy Number Minutes Cryotherapy: 10 Minutes Cryotherapy Location: Knee Type of Cryotherapy: Ice pack  Physical Therapy Assessment and Plan PT Assessment and Plan Clinical Impression Statement: Able to increase biodex PROM to 80% today for extension and flexion.  Returns to MD next Wednesday to discuss further surgery. PT Plan: Continue X 4 more visits then returns to MD; continue to progress ROM.     PT - End of Session Activity Tolerance: Patient tolerated treatment well General Behavior During Session: [facility] for tasks performed Cognition: [facility] for tasks performed   Kassi Esteve B. Bascom Levels, PTA 12/04/2011, 4:47 PM

## 2011-12-06 ENCOUNTER — Ambulatory Visit (HOSPITAL_COMMUNITY)
Admission: RE | Admit: 2011-12-06 | Discharge: 2011-12-06 | Disposition: A | Discharge status: Home / Self Care | Payer: 59 | Source: Ambulatory Visit | Attending: Family Medicine | Admitting: Family Medicine

## 2011-12-06 DIAGNOSIS — R29898 Other symptoms and signs involving the musculoskeletal system: Secondary | ICD-10-CM

## 2011-12-06 DIAGNOSIS — R262 Difficulty in walking, not elsewhere classified: Secondary | ICD-10-CM

## 2011-12-06 DIAGNOSIS — M25669 Stiffness of unspecified knee, not elsewhere classified: Secondary | ICD-10-CM
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
## 2011-12-08 ENCOUNTER — Ambulatory Visit (HOSPITAL_COMMUNITY)
Admission: RE | Admit: 2011-12-08 | Discharge: 2011-12-08 | Disposition: A | Discharge status: Home / Self Care | Payer: 59 | Source: Ambulatory Visit

## 2011-12-08 DIAGNOSIS — M25669 Stiffness of unspecified knee, not elsewhere classified: Secondary | ICD-10-CM

## 2011-12-08 DIAGNOSIS — R29898 Other symptoms and signs involving the musculoskeletal system: Secondary | ICD-10-CM

## 2011-12-08 DIAGNOSIS — R262 Difficulty in walking, not elsewhere classified: Secondary | ICD-10-CM
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
## 2011-12-11 ENCOUNTER — Ambulatory Visit (HOSPITAL_COMMUNITY)
Admission: RE | Admit: 2011-12-11 | Discharge: 2011-12-11 | Disposition: A | Discharge status: Home / Self Care | Payer: 59 | Source: Ambulatory Visit | Attending: Physical Therapy | Admitting: Physical Therapy

## 2011-12-11 NOTE — Progress Notes (Signed)
Physical Therapy Treatment Patient Details  Name: [name] MRN: [id number] Date of Birth: [birth year]  Today's Date: 12/11/2011 Time: 4098-1191 PT Time Calculation (min): 49 min Visit#: 21  of 24   Re-eval: 12/15/11 Charges:  therex 35'  , manual 10'    Subjective: Symptoms/Limitations Symptoms: Pt. reports the game plan according to the MD is surgery next Wednesday on the R LE and mobilization on the L knee.  Pt. reports currently in the same amount of pain 4/10.  States it is most difficulty for her to get comfortable at night and her hip and ankle also bother her.   Exercise/Treatments Seated Other Seated Knee Exercises: hold; try to add back next visit. Supine Short Arc Quad Sets: 15 reps Short Arc Quad Sets Limitations: 5# Knee Flexion: PROM Other Supine Knee Exercises: PROM R ankle 3x 30" each direction Prone  Hamstring Curl: 20 reps Hamstring Curl Limitations: L5# R8# Hip Extension: 20 reps Hip Extension Limitations: 5#B Prone Knee Hang: 5 minutes;Weights Prone Knee Hang Weights (lbs): 5    Physical Therapy Assessment and Plan PT Assessment and Plan Clinical Impression Statement: Continued to hold biodex due to pain.  Pt. able to complete all mat activties wtihout difficulty today. PT Plan: Re-evaluate next visit.     PT - End of Session Activity Tolerance: Patient tolerated treatment well General Behavior During Session: [facility] for tasks performed Cognition: [facility] for tasks performed   Ahmad Vanwey B. Bascom Levels, PTA 12/11/2011, 5:00 PM

## 2011-12-12 ENCOUNTER — Ambulatory Visit (HOSPITAL_COMMUNITY)
Admission: RE | Admit: 2011-12-12 | Discharge: 2011-12-12 | Disposition: A | Discharge status: Home / Self Care | Payer: 59 | Source: Ambulatory Visit | Attending: Family Medicine | Admitting: Family Medicine

## 2011-12-12 DIAGNOSIS — R262 Difficulty in walking, not elsewhere classified: Secondary | ICD-10-CM

## 2011-12-12 DIAGNOSIS — M25669 Stiffness of unspecified knee, not elsewhere classified: Secondary | ICD-10-CM

## 2011-12-12 DIAGNOSIS — R29898 Other symptoms and signs involving the musculoskeletal system: Secondary | ICD-10-CM
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
## 2011-12-13 ENCOUNTER — Ambulatory Visit (HOSPITAL_COMMUNITY): Payer: 59 | Admitting: *Deleted

## 2011-12-14 ENCOUNTER — Ambulatory Visit (HOSPITAL_COMMUNITY)
Admission: RE | Admit: 2011-12-14 | Discharge: 2011-12-14 | Disposition: A | Discharge status: Home / Self Care | Payer: 59 | Source: Ambulatory Visit | Attending: Family Medicine | Admitting: Family Medicine

## 2011-12-14 DIAGNOSIS — R29898 Other symptoms and signs involving the musculoskeletal system: Secondary | ICD-10-CM

## 2011-12-14 DIAGNOSIS — R262 Difficulty in walking, not elsewhere classified: Secondary | ICD-10-CM

## 2011-12-14 DIAGNOSIS — M25669 Stiffness of unspecified knee, not elsewhere classified: Secondary | ICD-10-CM

## 2011-12-14 NOTE — Progress Notes (Addendum)
Physical Therapy Re-evaluation/ Discharge  Patient Details  Name: [name] MRN: [id number] Date of Birth: 1965-12-26  Today's Date: 12/14/2011 Time: 1478-2956 PT Time Calculation (min): 63 min Visit#: 23  of 24   Re-eval:   Diagnosis: multiple fractures. Surgical Date: 06/29/12 Next MD Visit: surgery will be next visit. Charges:  Self care 25', MMTX1 unit, ROM X 1 unit   Subjective Symptoms/Limitations Symptoms: Pt. states she returned to MD yesterday and is planning for surgery either next week or week after.  Pt. states the plan is to remove the rod and replace with smaller rod in R LE with possible bone stimulator in the future.  They will also manipulate the L knee and arthroscopically remove the scar tissue.  Pt. also reports she has an appointment for an ultrsound on her breast this Friday.  Pt. states she is using her walker more at home, not relying so much on her wheelchair.  Pain varies 4-8/10 when mobile and can be painfree when completely still.  Reports morning is the worst, 9/10 when initially gets out of bed. Pain Assessment Currently in Pain?: Yes Pain Score: 0-No pain  Precautions/Restrictions  Precautions Precaution Comments: for L hip ORIF, limited WB for R LE due to non-healing fracture.  Objective: RLE AROM (degrees) Right Knee Extension: 0  Right Knee Flexion: 135  (was 125) Right Ankle Dorsiflexion : 0  (was lacking 5 degrees AROM, 10 degrees PROM) Right Ankle Plantar Flexion: 47  (was 47 degrees) Right Ankle Inversion: 22  (was 22 degrees) Right Ankle Eversion: 15  (was 10 degrees)  RLE Strength Right Hip Flexion: 5/5 (was 5/5) Right Hip Extension: 5/5 (was 3+/5) Right Hip ABduction: 5/5 (was 5/5) Right Hip ADduction: 5/5 (was 5/5) Right Knee Flexion: 5/5 (was 4/5) Right Knee Extension: 5/5 (was 5/5) Right Ankle Dorsiflexion: 4/5 (was 3+/5) Right Ankle Plantar Flexion: 4/5 (was 4/5) Right Ankle Inversion: 4/5 (was 4/5) Right Ankle Eversion: 4/5  (was 3+/5)  LLE AROM (degrees) Left Knee Extension : 15  (was 15 degrees) Left Knee Flexion : 80  (was 80 degrees) Left Ankle Dorsiflexion: 0 (was 0 degrees/neutral) Left Ankle Inversion: 25  (was 25 degrees) Left Ankle Eversion: 25  (was 12 degrees)  LLE PROM (degrees) Left Knee Extension : 15 (was 12 degrees) Left Knee Flexion : 80 (was 85 degrees)  LLE Strength Left Hip Flexion: 5/5 (was 5/5) Left Hip Extension: 5/5 (was 3-/5) Left Hip ABduction: 4/5 (was 3/5) Left Hip ADduction: 4/5 (was 3+/5) Left Knee Flexion: 5/5 Left Knee Extension: 5/5 Left Ankle Dorsiflexion: 5/5 Left Ankle Plantar Flexion: 5/5   Physical Therapy Assessment and Plan PT Assessment and Plan Clinical Impression Statement: Pt. has met all but 2 LTG's and is unable at this point due to non-healing fracture in R LE.  Pt. with increase in muscle strength B LE and ROM of R knee and B ankles,  however has made no gains in ROM for L knee. Pt. Continues to require RW for gait per MD due to instablity of R LE and is unable to progress with gait.   Pt. is agreeable to continuing HEP at this point until able to return following upcoming revision to R LE and manipulation to L LE in the next couple of weeks.  Pt. is independent with HEP and using walker 85% of the time with remaining 15% using wheelchair.  PT Plan: Suggest to evaluating PT to discharge PT at this point and resume as ordered following surgery.  Goals PT Long Term Goals PT Long Term Goal 1 - Progress: Met PT Long Term Goal 2 - Progress: Met Long Term Goal 3: Pt was using a cane but went to a walker when her MD stated her fx was not healing. Long Term Goal 3 Progress: Progressing toward goal Long Term Goal 4 Progress: Met PT Long Term Goal 5: new goal as of 11/17/11- Pt able to walk for an hour without stopping, Long Term Goal 5 Progress: Not met Additional PT Long Term Goals?: Yes PT Long Term Goal 6: Pt to be able to sleep waking up only 2 times a  night  Long Term Goal 6 Progress: Met PT Long Term Goal 7: Pt to be able to complete housework such as unloading dishwasher, laundry and meal. Long Term Goal 7 Progress: Partly met    PT - End of Session Activity Tolerance: Patient tolerated treatment well General Behavior During Session: [facility] for tasks performed Cognition: [facility] for tasks performed    Lurena Nida, PTA/CLT 12/14/2011, 4:33 PM

## 2011-12-15 ENCOUNTER — Ambulatory Visit (HOSPITAL_COMMUNITY): Payer: 59 | Admitting: Physical Therapy

## 2011-12-19 ENCOUNTER — Ambulatory Visit (HOSPITAL_COMMUNITY): Payer: 59 | Admitting: Physical Therapy

## 2011-12-20 ENCOUNTER — Ambulatory Visit (HOSPITAL_COMMUNITY): Payer: 59

## 2011-12-21 ENCOUNTER — Encounter (HOSPITAL_COMMUNITY): Payer: Self-pay | Admitting: Pharmacist

## 2011-12-25 ENCOUNTER — Other Ambulatory Visit (HOSPITAL_COMMUNITY): Payer: Self-pay | Admitting: Orthopaedic Surgery

## 2011-12-25 ENCOUNTER — Ambulatory Visit (HOSPITAL_COMMUNITY): Payer: 59 | Admitting: Physical Therapy

## 2011-12-25 ENCOUNTER — Other Ambulatory Visit (HOSPITAL_COMMUNITY): Payer: Self-pay

## 2011-12-26 ENCOUNTER — Other Ambulatory Visit: Payer: Self-pay | Admitting: Obstetrics and Gynecology

## 2011-12-26 ENCOUNTER — Encounter (HOSPITAL_COMMUNITY): Payer: Self-pay

## 2011-12-26 ENCOUNTER — Encounter (HOSPITAL_COMMUNITY)
Admission: RE | Admit: 2011-12-26 | Discharge: 2011-12-26 | Disposition: A | Discharge status: Home / Self Care | Payer: 59 | Source: Ambulatory Visit | Attending: Orthopaedic Surgery | Admitting: Orthopaedic Surgery

## 2011-12-26 DIAGNOSIS — R928 Other abnormal and inconclusive findings on diagnostic imaging of breast: Secondary | ICD-10-CM

## 2011-12-26 HISTORY — DX: Sleep disorder, unspecified: G47.9

## 2011-12-26 HISTORY — DX: Presence of coronary angioplasty implant and graft: Z95.5

## 2011-12-26 HISTORY — DX: Pilonidal cyst without abscess: L05.91

## 2011-12-26 HISTORY — DX: Unspecified osteoarthritis, unspecified site: M19.90

## 2011-12-26 HISTORY — DX: Essential (primary) hypertension: I10

## 2011-12-26 LAB — BASIC METABOLIC PANEL
BUN: 7 mg/dL (ref 6–23)
Chloride: 102 mEq/L (ref 96–112)
GFR calc Af Amer: >90 mL/min (ref >90)
Potassium: 4.1 mEq/L (ref 3.5–5.1)

## 2011-12-26 LAB — CBC
HCT: 45 % (ref 36.0–46.0)
RDW: 15.2 % (ref 11.5–15.5)
WBC: 13.8 10*3/uL — ABNORMAL HIGH (ref 4.0–10.5)

## 2011-12-26 LAB — SURGICAL PCR SCREEN: Staphylococcus aureus: POSITIVE — AB

## 2011-12-26 NOTE — Patient Instructions (Signed)
20 [name]  12/26/2011   Your procedure is scheduled on: 12/28/11  Report to SHORT STAY DEPT  at 6:30 AM.  Call this number if you have problems the morning of surgery: [phone]   Remember:   Do not eat food or drink liquids AFTER MIDNIGHT    Take these medicines the morning of surgery with A SIP OF WATER: METOPROLOL / OXYCODONE / XANAX   Do not wear jewelry, make-up or nail polish.  Do not wear lotions, powders, or perfumes.   Do not shave legs or underarms 48 hrs. before surgery (men may shave face)  Do not bring valuables to the hospital.  Contacts, dentures or bridgework may not be worn into surgery.  Leave suitcase in the car. After surgery it may be brought to your room.  For patients admitted to the hospital, checkout time is 11:00 AM the day of discharge.   Patients discharged the day of surgery will not be allowed to drive home.    Special Instructions:   Please read over the following fact sheets that you were given: MRSA  Information               SHOWER WITH BETASEPT THE NIGHT BEFORE SURGERY AND THE MORNING OF SURGERY

## 2011-12-27 ENCOUNTER — Ambulatory Visit (HOSPITAL_COMMUNITY): Payer: 59 | Admitting: Physical Therapy

## 2011-12-27 NOTE — Pre-Procedure Instructions (Signed)
Pt was in too much pain at PST visit , did not want to do CXR until morning of surg.

## 2011-12-27 NOTE — Pre-Procedure Instructions (Signed)
Spoke with Joyce Gross at office regarding abnormal CBC. She will let Dr. Magnus Ivan know about it.

## 2011-12-28 ENCOUNTER — Ambulatory Visit (HOSPITAL_COMMUNITY): Payer: 59

## 2011-12-28 ENCOUNTER — Encounter (HOSPITAL_COMMUNITY): Payer: Self-pay | Admitting: *Deleted

## 2011-12-28 ENCOUNTER — Encounter (HOSPITAL_COMMUNITY)
Admission: RE | Disposition: A | Discharge status: Home Health | Payer: Self-pay | Source: Ambulatory Visit | Attending: Orthopaedic Surgery

## 2011-12-28 ENCOUNTER — Encounter (HOSPITAL_COMMUNITY): Payer: Self-pay | Admitting: Anesthesiology

## 2011-12-28 ENCOUNTER — Ambulatory Visit (HOSPITAL_COMMUNITY): Payer: 59 | Admitting: Anesthesiology

## 2011-12-28 ENCOUNTER — Inpatient Hospital Stay (HOSPITAL_COMMUNITY)
Admission: RE | Admit: 2011-12-28 | Discharge: 2011-12-29 | DRG: 489 | Disposition: A | Discharge status: Home Health | Payer: 59 | Source: Ambulatory Visit | Attending: Orthopaedic Surgery | Admitting: Orthopaedic Surgery

## 2011-12-28 DIAGNOSIS — I10 Essential (primary) hypertension: Secondary | ICD-10-CM | POA: Diagnosis present

## 2011-12-28 DIAGNOSIS — S82301K Unspecified fracture of lower end of right tibia, subsequent encounter for closed fracture with nonunion: Secondary | ICD-10-CM

## 2011-12-28 DIAGNOSIS — S82202A Unspecified fracture of shaft of left tibia, initial encounter for closed fracture: Secondary | ICD-10-CM

## 2011-12-28 DIAGNOSIS — M2469 Ankylosis, other specified joint: Secondary | ICD-10-CM | POA: Diagnosis present

## 2011-12-28 DIAGNOSIS — Z79899 Other long term (current) drug therapy: Secondary | ICD-10-CM

## 2011-12-28 DIAGNOSIS — Z01812 Encounter for preprocedural laboratory examination: Secondary | ICD-10-CM

## 2011-12-28 DIAGNOSIS — I2510 Atherosclerotic heart disease of native coronary artery without angina pectoris: Secondary | ICD-10-CM | POA: Diagnosis present

## 2011-12-28 DIAGNOSIS — F17200 Nicotine dependence, unspecified, uncomplicated: Secondary | ICD-10-CM | POA: Diagnosis present

## 2011-12-28 DIAGNOSIS — E785 Hyperlipidemia, unspecified: Secondary | ICD-10-CM | POA: Diagnosis present

## 2011-12-28 DIAGNOSIS — M24669 Ankylosis, unspecified knee: Secondary | ICD-10-CM | POA: Diagnosis present

## 2011-12-28 DIAGNOSIS — E119 Type 2 diabetes mellitus without complications: Secondary | ICD-10-CM | POA: Diagnosis present

## 2011-12-28 DIAGNOSIS — E079 Disorder of thyroid, unspecified: Secondary | ICD-10-CM | POA: Diagnosis present

## 2011-12-28 DIAGNOSIS — Z7982 Long term (current) use of aspirin: Secondary | ICD-10-CM

## 2011-12-28 DIAGNOSIS — F411 Generalized anxiety disorder: Secondary | ICD-10-CM | POA: Diagnosis present

## 2011-12-28 DIAGNOSIS — IMO0002 Reserved for concepts with insufficient information to code with codable children: Principal | ICD-10-CM | POA: Diagnosis present

## 2011-12-28 HISTORY — PX: KNEE ARTHROSCOPY: SHX127

## 2011-12-28 HISTORY — PX: TIBIA IM NAIL INSERTION: SHX2516

## 2011-12-28 LAB — GLUCOSE, CAPILLARY
Glucose-Capillary: 112 mg/dL — ABNORMAL HIGH (ref 70–99)
Glucose-Capillary: 120 mg/dL — ABNORMAL HIGH (ref 70–99)

## 2011-12-28 SURGERY — INSERTION, INTRAMEDULLARY ROD, TIBIA
Anesthesia: General | Site: Leg Lower | Laterality: Right | Wound class: Clean

## 2011-12-28 MED ORDER — EZETIMIBE-SIMVASTATIN 10-40 MG PO TABS
1.0000 | ORAL_TABLET | Freq: Every day | ORAL | Status: DC
  Administered 2011-12-28: 1 via ORAL
  Filled 2011-12-28 (×2): qty 1

## 2011-12-28 MED ORDER — MORPHINE SULFATE 2 MG/ML IJ SOLN: 1.0000 mg | INTRAMUSCULAR | Status: DC | PRN

## 2011-12-28 MED ORDER — ONDANSETRON HCL 4 MG/2ML IJ SOLN: 4.0000 mg | Freq: Four times a day (QID) | INTRAMUSCULAR | Status: DC | PRN

## 2011-12-28 MED ORDER — MORPHINE SULFATE 4 MG/ML IJ SOLN
INTRAMUSCULAR | Status: DC | PRN
  Administered 2011-12-28: 1 mg via INTRAVENOUS

## 2011-12-28 MED ORDER — ALPRAZOLAM 0.5 MG PO TABS: 0.5000 mg | ORAL_TABLET | Freq: Two times a day (BID) | ORAL | Status: DC | PRN

## 2011-12-28 MED ORDER — MIDAZOLAM HCL 5 MG/5ML IJ SOLN
INTRAMUSCULAR | Status: DC | PRN
  Administered 2011-12-28: 2 mg via INTRAVENOUS

## 2011-12-28 MED ORDER — CLOPIDOGREL BISULFATE 75 MG PO TABS
75.0000 mg | ORAL_TABLET | Freq: Every day | ORAL | Status: DC
Start: 2011-12-28 — End: 2011-12-29
  Administered 2011-12-28 – 2011-12-29 (×2): 75 mg via ORAL
  Filled 2011-12-28 (×2): qty 1

## 2011-12-28 MED ORDER — LACTATED RINGERS IV SOLN
INTRAVENOUS | Status: DC | PRN
  Administered 2011-12-28 (×2): via INTRAVENOUS

## 2011-12-28 MED ORDER — FENTANYL CITRATE 0.05 MG/ML IJ SOLN
INTRAMUSCULAR | Status: DC | PRN
  Administered 2011-12-28 (×2): 25 ug via INTRAVENOUS
  Administered 2011-12-28: 100 ug via INTRAVENOUS
  Administered 2011-12-28: 50 ug via INTRAVENOUS
  Administered 2011-12-28: 25 ug via INTRAVENOUS
  Administered 2011-12-28 (×2): 50 ug via INTRAVENOUS
  Administered 2011-12-28: 25 ug via INTRAVENOUS
  Administered 2011-12-28: 100 ug via INTRAVENOUS
  Administered 2011-12-28 (×2): 25 ug via INTRAVENOUS

## 2011-12-28 MED ORDER — PROPOFOL 10 MG/ML IV BOLUS
INTRAVENOUS | Status: DC | PRN
  Administered 2011-12-28: 150 mg via INTRAVENOUS

## 2011-12-28 MED ORDER — DOCUSATE SODIUM 100 MG PO CAPS
100.0000 mg | ORAL_CAPSULE | Freq: Two times a day (BID) | ORAL | Status: DC
  Administered 2011-12-28: 100 mg via ORAL

## 2011-12-28 MED ORDER — CLINDAMYCIN PHOSPHATE 600 MG/50ML IV SOLN
INTRAVENOUS | Status: AC
  Filled 2011-12-28: qty 50

## 2011-12-28 MED ORDER — NALOXONE HCL 0.4 MG/ML IJ SOLN: 0.4000 mg | INTRAMUSCULAR | Status: DC | PRN

## 2011-12-28 MED ORDER — CLINDAMYCIN PHOSPHATE 600 MG/50ML IV SOLN
600.0000 mg | INTRAVENOUS | Status: AC
  Administered 2011-12-28: 600 mg via INTRAVENOUS

## 2011-12-28 MED ORDER — ASPIRIN 81 MG PO CHEW
162.0000 mg | CHEWABLE_TABLET | Freq: Every day | ORAL | Status: DC
  Administered 2011-12-28 – 2011-12-29 (×2): 162 mg via ORAL
  Filled 2011-12-28 (×2): qty 2

## 2011-12-28 MED ORDER — HYDROMORPHONE HCL PF 1 MG/ML IJ SOLN
0.2500 mg | INTRAMUSCULAR | Status: DC | PRN
  Administered 2011-12-28 (×4): 0.5 mg via INTRAVENOUS

## 2011-12-28 MED ORDER — DIPHENHYDRAMINE HCL 12.5 MG/5ML PO ELIX: 12.5000 mg | ORAL_SOLUTION | ORAL | Status: DC | PRN

## 2011-12-28 MED ORDER — ROPIVACAINE HCL 5 MG/ML IJ SOLN
INTRAMUSCULAR | Status: AC
  Filled 2011-12-28: qty 30

## 2011-12-28 MED ORDER — METHOCARBAMOL 500 MG PO TABS: 500.0000 mg | ORAL_TABLET | Freq: Four times a day (QID) | ORAL | Status: DC | PRN

## 2011-12-28 MED ORDER — LACTATED RINGERS IV SOLN: INTRAVENOUS | Status: DC

## 2011-12-28 MED ORDER — ZOLPIDEM TARTRATE 5 MG PO TABS: 5.0000 mg | ORAL_TABLET | Freq: Every evening | ORAL | Status: DC | PRN

## 2011-12-28 MED ORDER — KETOROLAC TROMETHAMINE 30 MG/ML IJ SOLN: 15.0000 mg | Freq: Once | INTRAMUSCULAR | Status: DC | PRN

## 2011-12-28 MED ORDER — 0.9 % SODIUM CHLORIDE (POUR BTL) OPTIME
TOPICAL | Status: DC | PRN
  Administered 2011-12-28: 1000 mL

## 2011-12-28 MED ORDER — DIPHENHYDRAMINE HCL 12.5 MG/5ML PO ELIX
12.5000 mg | ORAL_SOLUTION | Freq: Four times a day (QID) | ORAL | Status: DC | PRN
  Filled 2011-12-28: qty 5

## 2011-12-28 MED ORDER — METFORMIN HCL 500 MG PO TABS
500.0000 mg | ORAL_TABLET | Freq: Two times a day (BID) | ORAL | Status: DC
  Filled 2011-12-28 (×4): qty 1

## 2011-12-28 MED ORDER — PHENYLEPHRINE HCL 10 MG/ML IJ SOLN
INTRAMUSCULAR | Status: DC | PRN
  Administered 2011-12-28 (×2): 40 ug via INTRAVENOUS

## 2011-12-28 MED ORDER — METOPROLOL TARTRATE 25 MG PO TABS
25.0000 mg | ORAL_TABLET | Freq: Two times a day (BID) | ORAL | Status: DC
  Administered 2011-12-29: 25 mg via ORAL
  Filled 2011-12-28 (×3): qty 1

## 2011-12-28 MED ORDER — MORPHINE SULFATE 4 MG/ML IJ SOLN
INTRAMUSCULAR | Status: AC
  Filled 2011-12-28: qty 1

## 2011-12-28 MED ORDER — BUPIVACAINE HCL (PF) 0.5 % IJ SOLN
INTRAMUSCULAR | Status: DC | PRN
  Administered 2011-12-28: 20 mL

## 2011-12-28 MED ORDER — NICOTINE 21 MG/24HR TD PT24
21.0000 mg | MEDICATED_PATCH | Freq: Every day | TRANSDERMAL | Status: DC
  Filled 2011-12-28: qty 1

## 2011-12-28 MED ORDER — METHOCARBAMOL 500 MG PO TABS
500.0000 mg | ORAL_TABLET | Freq: Four times a day (QID) | ORAL | Status: DC | PRN
  Administered 2011-12-29: 500 mg via ORAL
  Filled 2011-12-28: qty 1

## 2011-12-28 MED ORDER — SITAGLIPTIN PHOS-METFORMIN HCL 50-500 MG PO TABS: 1.0000 | ORAL_TABLET | Freq: Two times a day (BID) | ORAL | Status: DC

## 2011-12-28 MED ORDER — HYDROMORPHONE HCL PF 1 MG/ML IJ SOLN
INTRAMUSCULAR | Status: AC
  Filled 2011-12-28: qty 1

## 2011-12-28 MED ORDER — SODIUM CHLORIDE 0.9 % IJ SOLN: 9.0000 mL | INTRAMUSCULAR | Status: DC | PRN

## 2011-12-28 MED ORDER — ONDANSETRON HCL 4 MG/2ML IJ SOLN
INTRAMUSCULAR | Status: DC | PRN
  Administered 2011-12-28: 4 mg via INTRAVENOUS

## 2011-12-28 MED ORDER — HYDROMORPHONE 0.3 MG/ML IV SOLN
INTRAVENOUS | Status: DC
  Administered 2011-12-28 (×2): via INTRAVENOUS
  Administered 2011-12-28: 2.1 mg via INTRAVENOUS
  Administered 2011-12-29: 1.2 mg via INTRAVENOUS
  Administered 2011-12-29: 2.4 mg via INTRAVENOUS
  Filled 2011-12-28: qty 25

## 2011-12-28 MED ORDER — PROMETHAZINE HCL 25 MG/ML IJ SOLN: 6.2500 mg | INTRAMUSCULAR | Status: DC | PRN

## 2011-12-28 MED ORDER — ONDANSETRON HCL 4 MG PO TABS: 4.0000 mg | ORAL_TABLET | Freq: Four times a day (QID) | ORAL | Status: DC | PRN

## 2011-12-28 MED ORDER — LACTATED RINGERS IR SOLN
Status: DC | PRN
  Administered 2011-12-28: 3000 mL

## 2011-12-28 MED ORDER — METOCLOPRAMIDE HCL 10 MG PO TABS: 5.0000 mg | ORAL_TABLET | Freq: Three times a day (TID) | ORAL | Status: DC | PRN

## 2011-12-28 MED ORDER — METOCLOPRAMIDE HCL 5 MG/ML IJ SOLN: 5.0000 mg | Freq: Three times a day (TID) | INTRAMUSCULAR | Status: DC | PRN

## 2011-12-28 MED ORDER — LINAGLIPTIN 5 MG PO TABS
5.0000 mg | ORAL_TABLET | Freq: Every day | ORAL | Status: DC
  Administered 2011-12-28: 5 mg via ORAL
  Filled 2011-12-28 (×4): qty 1

## 2011-12-28 MED ORDER — BUPIVACAINE HCL (PF) 0.5 % IJ SOLN
INTRAMUSCULAR | Status: AC
  Filled 2011-12-28: qty 30

## 2011-12-28 MED ORDER — SODIUM CHLORIDE 0.9 % IV SOLN
INTRAVENOUS | Status: DC
  Administered 2011-12-28: 17:00:00 via INTRAVENOUS

## 2011-12-28 MED ORDER — VITAMIN C 500 MG PO TABS
500.0000 mg | ORAL_TABLET | Freq: Every day | ORAL | Status: DC
  Administered 2011-12-28 – 2011-12-29 (×2): 500 mg via ORAL
  Filled 2011-12-28 (×2): qty 1

## 2011-12-28 MED ORDER — NITROGLYCERIN 0.4 MG SL SUBL: 0.4000 mg | SUBLINGUAL_TABLET | SUBLINGUAL | Status: DC | PRN

## 2011-12-28 MED ORDER — BENAZEPRIL HCL 5 MG PO TABS
5.0000 mg | ORAL_TABLET | Freq: Every day | ORAL | Status: DC
  Administered 2011-12-28 – 2011-12-29 (×2): 5 mg via ORAL
  Filled 2011-12-28 (×2): qty 1

## 2011-12-28 MED ORDER — OXYCODONE HCL 5 MG PO TABS
5.0000 mg | ORAL_TABLET | ORAL | Status: DC | PRN
  Administered 2011-12-29: 10 mg via ORAL
  Filled 2011-12-28: qty 2

## 2011-12-28 MED ORDER — DIPHENHYDRAMINE HCL 50 MG/ML IJ SOLN
12.5000 mg | Freq: Four times a day (QID) | INTRAMUSCULAR | Status: DC | PRN
Start: 2011-12-28 — End: 2011-12-29

## 2011-12-28 MED ORDER — METHOCARBAMOL 100 MG/ML IJ SOLN
500.0000 mg | Freq: Four times a day (QID) | INTRAVENOUS | Status: DC | PRN
  Administered 2011-12-28: 500 mg via INTRAVENOUS
  Filled 2011-12-28: qty 5

## 2011-12-28 MED ORDER — HYDROCODONE-ACETAMINOPHEN 5-325 MG PO TABS
1.0000 | ORAL_TABLET | ORAL | Status: DC | PRN
  Administered 2011-12-29: 2 via ORAL
  Filled 2011-12-28: qty 2

## 2011-12-28 MED ORDER — PHENYLEPHRINE HCL 10 MG/ML IJ SOLN: 10.0000 mg | INTRAVENOUS | Status: DC | PRN

## 2011-12-28 MED ORDER — HYDROMORPHONE 0.3 MG/ML IV SOLN
INTRAVENOUS | Status: AC
  Administered 2011-12-29: 1.5 mg via INTRAVENOUS
  Filled 2011-12-28: qty 25

## 2011-12-28 MED ORDER — CLINDAMYCIN PHOSPHATE 600 MG/50ML IV SOLN
600.0000 mg | Freq: Four times a day (QID) | INTRAVENOUS | Status: AC
  Administered 2011-12-28 – 2011-12-29 (×3): 600 mg via INTRAVENOUS
  Filled 2011-12-28 (×3): qty 50

## 2011-12-28 SURGICAL SUPPLY — 64 items
BAG ZIPLOCK 12X15 (MISCELLANEOUS) ×3 IMPLANT
BANDAGE ELASTIC 4 VELCRO ST LF (GAUZE/BANDAGES/DRESSINGS) ×3 IMPLANT
BANDAGE ELASTIC 6 VELCRO ST LF (GAUZE/BANDAGES/DRESSINGS) ×3 IMPLANT
BIT DRILL LONG 4.0 (BIT) ×2 IMPLANT
BIT DRILL SHORT 4.0 (BIT) ×2 IMPLANT
BLADE CUDA SHAVER 3.5 (BLADE) ×3 IMPLANT
BOLT EXTRACTION (Bolt) ×3 IMPLANT
BONE CHIP PRESERV 30CC PCAN30 (Bone Implant) ×3 IMPLANT
CLOTH BEACON ORANGE TIMEOUT ST (SAFETY) ×6 IMPLANT
CUFF TOURN SGL QUICK 34 (TOURNIQUET CUFF) ×1
CUFF TRNQT CYL 34X4X40X1 (TOURNIQUET CUFF) ×2 IMPLANT
DRAPE C-ARM 42X72 X-RAY (DRAPES) ×3 IMPLANT
DRAPE U-SHAPE 47X51 STRL (DRAPES) ×9 IMPLANT
DRILL BIT LONG 4.0 (BIT) ×3
DRILL BIT SHORT 4.0 (BIT) ×1
DRSG EMULSION OIL 3X3 NADH (GAUZE/BANDAGES/DRESSINGS) IMPLANT
DRSG PAD ABDOMINAL 8X10 ST (GAUZE/BANDAGES/DRESSINGS) ×6 IMPLANT
DURAPREP 26ML APPLICATOR (WOUND CARE) ×9 IMPLANT
ELECT REM PT RETURN 9FT ADLT (ELECTROSURGICAL) ×3
ELECTRODE REM PT RTRN 9FT ADLT (ELECTROSURGICAL) ×2 IMPLANT
GAUZE XEROFORM 1X8 LF (GAUZE/BANDAGES/DRESSINGS) ×9 IMPLANT
GLOVE BIOGEL PI IND STRL 7.5 (GLOVE) ×2 IMPLANT
GLOVE BIOGEL PI IND STRL 8 (GLOVE) ×4 IMPLANT
GLOVE BIOGEL PI INDICATOR 7.5 (GLOVE) ×1
GLOVE BIOGEL PI INDICATOR 8 (GLOVE) ×2
GLOVE ECLIPSE 8.0 STRL XLNG CF (GLOVE) IMPLANT
GLOVE ORTHO TXT STRL SZ7.5 (GLOVE) ×6 IMPLANT
GLOVE SURG ORTHO 8.0 STRL STRW (GLOVE) ×6 IMPLANT
GOWN STRL NON-REIN LRG LVL3 (GOWN DISPOSABLE) IMPLANT
GOWN STRL REIN XL XLG (GOWN DISPOSABLE) ×9 IMPLANT
GUIDE PIN 3.2MM (MISCELLANEOUS) ×1
GUIDE PIN ORTH 343X3.2XBRAD (MISCELLANEOUS) ×2 IMPLANT
GUIDE ROD 3.0 (MISCELLANEOUS) ×3
IV LACTATED RINGER IRRG 3000ML (IV SOLUTION) ×3
IV LR IRRIG 3000ML ARTHROMATIC (IV SOLUTION) ×6 IMPLANT
MANIFOLD NEPTUNE II (INSTRUMENTS) ×3 IMPLANT
NAIL IM CANN TRIG 10X32 (Nail) ×2 IMPLANT
NAIL TIBIAL 10X32 (Nail) ×3 IMPLANT
NS IRRIG 1000ML POUR BTL (IV SOLUTION) ×3 IMPLANT
PACK ARTHROSCOPY WL (CUSTOM PROCEDURE TRAY) ×3 IMPLANT
PACK LOWER EXTREMITY WL (CUSTOM PROCEDURE TRAY) ×3 IMPLANT
PAD CAST 4YDX4 CTTN HI CHSV (CAST SUPPLIES) ×4 IMPLANT
PADDING CAST COTTON 4X4 STRL (CAST SUPPLIES) ×2
PADDING CAST COTTON 6X4 STRL (CAST SUPPLIES) ×3 IMPLANT
POSITIONER SURGICAL ARM (MISCELLANEOUS) ×6 IMPLANT
PUTTY 10ML ACTIFUSE ABX (Putty) ×3 IMPLANT
ROD GUIDE 3.0 (MISCELLANEOUS) ×2 IMPLANT
SCREW TRIGEN LOW PROF 5.0X27.5 (Screw) ×3 IMPLANT
SCREW TRIGEN LOW PROF 5.0X37.5 (Screw) ×3 IMPLANT
SET ARTHROSCOPY TUBING (MISCELLANEOUS) ×1
SET ARTHROSCOPY TUBING LN (MISCELLANEOUS) ×2 IMPLANT
SPONGE GAUZE 4X4 12PLY (GAUZE/BANDAGES/DRESSINGS) ×6 IMPLANT
SUT ETHILON 4 0 PS 2 18 (SUTURE) ×3 IMPLANT
SUT PDS AB 3-0 CT2 27 (SUTURE) IMPLANT
SUT VIC AB 0 CT1 36 (SUTURE) ×3 IMPLANT
SUT VIC AB 2-0 CT1 27 (SUTURE) ×2
SUT VIC AB 2-0 CT1 TAPERPNT 27 (SUTURE) ×4 IMPLANT
SYR 20CC LL (SYRINGE) ×3 IMPLANT
SYR CONTROL 10ML LL (SYRINGE) ×3 IMPLANT
TOWEL OR 17X26 10 PK STRL BLUE (TOWEL DISPOSABLE) ×6 IMPLANT
TUBING CONNECTING 10 (TUBING) ×3 IMPLANT
WAND 90 DEG TURBOVAC W/CORD (SURGICAL WAND) IMPLANT
WATER STERILE IRR 1500ML POUR (IV SOLUTION) ×3 IMPLANT
WRAP KNEE MAXI GEL POST OP (GAUZE/BANDAGES/DRESSINGS) ×3 IMPLANT

## 2011-12-28 NOTE — Anesthesia Preprocedure Evaluation (Addendum)
Anesthesia Evaluation  Patient identified by MRN, date of birth, ID band Patient awake    Reviewed: Allergy & Precautions, H&P , NPO status , Patient's Chart, lab work & pertinent test results  History of Anesthesia Complications (+) DIFFICULT AIRWAY  Airway Mallampati: III TM Distance: <3 FB Neck ROM: Limited  Mouth opening: Limited Mouth Opening  Dental No notable dental hx.    Pulmonary Current Smoker,  breath sounds clear to auscultation  Pulmonary exam normal       Cardiovascular hypertension, + CAD and + Peripheral Vascular Disease Rhythm:Regular Rate:Normal     Neuro/Psych  C-spine cleared negative psych ROS   GI/Hepatic negative GI ROS, Neg liver ROS,   Endo/Other  Diabetes mellitus-Morbid obesity  Renal/GU negative Renal ROS  negative genitourinary   Musculoskeletal negative musculoskeletal ROS (+)   Abdominal   Peds negative pediatric ROS (+)  Hematology negative hematology ROS (+)   Anesthesia Other Findings   Reproductive/Obstetrics negative OB ROS                          Anesthesia Physical Anesthesia Plan  ASA: III  Anesthesia Plan: General   Post-op Pain Management:    Induction: Intravenous  Airway Management Planned: LMA  Additional Equipment:   Intra-op Plan:   Post-operative Plan:   Informed Consent: I have reviewed the patients History and Physical, chart, labs and discussed the procedure including the risks, benefits and alternatives for the proposed anesthesia with the patient or authorized representative who has indicated his/her understanding and acceptance.   Dental advisory given  Plan Discussed with: CRNA  Anesthesia Plan Comments: (Glidescope backup)       Anesthesia Quick Evaluation

## 2011-12-28 NOTE — Progress Notes (Signed)
Patient had low blood pressure during routine vital check.  Patient had lopressor scheduled at 2200.  Paged Dr. Magnus Ivan and he gave an order to hold tonight's dose of lopressor because of hypotension.

## 2011-12-28 NOTE — Transfer of Care (Signed)
Immediate Anesthesia Transfer of Care Note  Patient: [name]  Procedure(s) Performed: Procedure(s) (LRB): INTRAMEDULLARY (IM) NAIL TIBIAL (Right) ARTHROSCOPY KNEE (Left)  Patient Location: PACU  Anesthesia Type: General and Regional  Level of Consciousness: awake, alert , oriented and patient cooperative  Airway & Oxygen Therapy: Patient Spontanous Breathing and Patient connected to face mask oxygen  Post-op Assessment: Report given to PACU RN and Post -op Vital signs reviewed and stable  Post vital signs: Reviewed and stable  Complications: No apparent anesthesia complications
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
## 2011-12-28 NOTE — Preoperative (Signed)
Beta Blockers   Reason not to administer Beta Blockers:Not Applicable 

## 2011-12-28 NOTE — Progress Notes (Signed)
Portable AP and LATERAL RIGHT TIBIA  X-RAYS DONE.

## 2011-12-28 NOTE — Anesthesia Procedure Notes (Addendum)
Anesthesia Regional Block:  Femoral nerve block  Pre-Anesthetic Checklist: ,, timeout performed, Correct Patient, Correct Site, Correct Laterality, Correct Procedure, Correct Position, site marked, Risks and benefits discussed,  Surgical consent,  Pre-op evaluation,  At surgeon's request and post-op pain management  Laterality: Left  Prep: chloraprep       Needles:  Injection technique: Single-shot  Needle Type: Stimiplex     Needle Length: 10cm 10 cm Needle Gauge: 20 and 20 G    Additional Needles:  Procedures: ultrasound guided Femoral nerve block Narrative:  Injection made incrementally with aspirations every 5 mL.  Events: blood aspirated  Performed by: Personally  Anesthesiologist: Eilene Ghazi MD  Additional Notes: Patient tolerated the procedure well without complications  Femoral nerve block

## 2011-12-28 NOTE — Brief Op Note (Signed)
12/28/2011  11:42 AM  PATIENT:  [name]  46 y.o. female  PRE-OPERATIVE DIAGNOSIS:  Right tibia fracture non-union, Severe arthrofibrosis left knee  POST-OPERATIVE DIAGNOSIS:  right tibia fracture, non union, severe left knee arthrofibrosis  PROCEDURE:  Procedure(s) (LRB): INTRAMEDULLARY (IM) NAIL TIBIAL (Right) ARTHROSCOPY KNEE (Left)  SURGEON:  Surgeon(s) and Role:    * Kathryne Hitch, MD - Primary  PHYSICIAN ASSISTANT:   ASSISTANTS: none   ANESTHESIA:   local, regional and general  EBL:  Total I/O In: 1000 [I.V.:1000] Out: 350 [Urine:175; Blood:175]  BLOOD ADMINISTERED:none  DRAINS: none   LOCAL MEDICATIONS USED:  MARCAINE     SPECIMEN:  No Specimen  DISPOSITION OF SPECIMEN:  N/A  COUNTS:  YES  TOURNIQUET:  * Missing tourniquet times found for documented tourniquets in log:  16109 *  DICTATION: .Other Dictation: Dictation Number [id number]  PLAN OF CARE: Admit to inpatient   PATIENT DISPOSITION:  PACU - hemodynamically stable.   Delay start of Pharmacological VTE agent (>24hrs) due to surgical blood loss or risk of bleeding: no

## 2011-12-28 NOTE — Anesthesia Postprocedure Evaluation (Signed)
  Anesthesia Post-op Note  Patient: [name]  Procedure(s) Performed: Procedure(s) (LRB): INTRAMEDULLARY (IM) NAIL TIBIAL (Right) ARTHROSCOPY KNEE (Left)  Patient Location: PACU  Anesthesia Type: General  Level of Consciousness: awake and alert   Airway and Oxygen Therapy: Patient Spontanous Breathing  Post-op Pain: mild  Post-op Assessment: Post-op Vital signs reviewed, Patient's Cardiovascular Status Stable, Respiratory Function Stable, Patent Airway and No signs of Nausea or vomiting  Post-op Vital Signs: stable  Complications: No apparent anesthesia complications

## 2011-12-29 ENCOUNTER — Ambulatory Visit (HOSPITAL_COMMUNITY): Payer: 59

## 2011-12-29 MED ORDER — OXYCODONE-ACETAMINOPHEN 10-325 MG PO TABS: 1.0000 | ORAL_TABLET | ORAL | Status: DC | PRN

## 2011-12-29 NOTE — Evaluation (Signed)
Physical Therapy Evaluation Patient Details Name: [name] MRN: [id number] DOB: 1965/08/19 Today's Date: 12/29/2011 Time: 4098-1191 PT Time Calculation (min): 54 min  PT Assessment / Plan / Recommendation Clinical Impression  Pt with R IM nail replacement and L knee arthroscopy and manipulation under anaesthetic presents with decreased Bil LE strength/ROM and limiations in functional mobility    PT Assessment  Patient needs continued PT services    Follow Up Recommendations  Home health PT    Barriers to Discharge        lEquipment Recommendations  None recommended by PT    Recommendations for Other Services     Frequency      Precautions / Restrictions Precautions Precautions: None Restrictions Weight Bearing Restrictions: No Other Position/Activity Restrictions: WBAT         Mobility  Bed Mobility Bed Mobility: Supine to Sit Supine to Sit: 4: Min assist Details for Bed Mobility Assistance: min assist with R LE; min VC for sequence Transfers Transfers: Sit to Stand;Stand to Sit Sit to Stand: 4: Min assist;4: Min guard Stand to Sit: 4: Min assist;4: Min guard Details for Transfer Assistance: cues for LE position and use of UEs to self assist Ambulation/Gait Ambulation/Gait Assistance: 4: Min guard Ambulation Distance (Feet): 50 Feet (2x25') Assistive device: Rolling walker Ambulation/Gait Assistance Details: cues for sequence, posture and position from RW Gait Pattern: Step-to pattern    Exercises General Exercises - Lower Extremity Ankle Circles/Pumps: AROM;AAROM;10 reps;Supine;Both (AAROM on R) Quad Sets: AROM;15 reps;Both;Supine Heel Slides: AAROM;10 reps;Supine;Both Hip ABduction/ADduction: AAROM;10 reps;Supine;Both   PT Diagnosis: Difficulty walking  PT Problem List: Decreased strength;Decreased range of motion;Decreased activity tolerance;Decreased mobility;Decreased knowledge of use of DME;Pain PT Treatment Interventions: DME instruction;Gait  training;Functional mobility training;Therapeutic activities;Therapeutic exercise;Patient/family education   PT Goals Acute Rehab PT Goals PT Goal Formulation:  (Pt d/c home after eval)  Visit Information  Last PT Received On: 12/29/11 Assistance Needed: +1    Subjective Data  Subjective: I did not think this was going to hurt so bad after surgery Patient Stated Goal: Go back to work   Prior Functioning  Home Living Lives With: Spouse Available Help at Discharge: Family Type of Home: House Home Access: Ramped entrance Home Layout: One level Additional Comments: Pt reports family has assisted in her care since d/c from SNF in [place] and is familiar with how to assist Prior Function Level of Independence: Independent with assistive device(s) Able to Take Stairs?: No Driving: No Vocation: On disability Communication Communication: No difficulties Dominant Hand: Left    Cognition  Overall Cognitive Status: Appears within functional limits for tasks assessed/performed Arousal/Alertness: Awake/alert Orientation Level: Appears intact for tasks assessed Behavior During Session: [facility] for tasks performed    Extremity/Trunk Assessment Right Upper Extremity Assessment RUE ROM/Strength/Tone: Within functional levels Left Upper Extremity Assessment LUE ROM/Strength/Tone: WFL for tasks assessed Right Lower Extremity Assessment RLE ROM/Strength/Tone: Deficits RLE ROM/Strength/Tone Deficits: min active DF at ankle but AAROM to neutral; AAROM at knee -10 - 35 pain limited Left Lower Extremity Assessment LLE ROM/Strength/Tone: Deficits LLE ROM/Strength/Tone Deficits: knee AAROM -15 - 65; quads 3-/5   Balance    End of Session PT - End of Session Equipment Utilized During Treatment: Gait belt Activity Tolerance: Patient tolerated treatment well Patient left: in chair;with call bell/phone within reach Nurse Communication: Mobility status   [name] 12/29/2011, 2:54 PM

## 2011-12-29 NOTE — Progress Notes (Signed)
Subjective: 1 Day Post-Op Procedure(s) (LRB): INTRAMEDULLARY (IM) NAIL TIBIAL (Right) ARTHROSCOPY KNEE (Left) Patient reports pain as moderate.    Objective: Vital signs in last 24 hours: Temp:  [97.4 F (36.3 C)-98.7 F (37.1 C)] 98.7 F (37.1 C) (06/07 0700) Pulse Rate:  [66-91] 91  (06/07 0700) Resp:  [9-20] 11  (06/07 0700) BP: (85-166)/(58-95) 100/70 mmHg (06/07 0700) SpO2:  [98 %-100 %] 99 % (06/07 0700) Weight:  [77.111 kg (170 lb)] 77.111 kg (170 lb) (06/06 1415)  Intake/Output from previous day: 06/06 0701 - 06/07 0700 In: 2843.8 [I.V.:2788.8; IV Piggyback:55] Out: 3375 [Urine:3200; Blood:175] Intake/Output this shift:     Basename 12/26/11 1600  HGB 15.1*    Basename 12/26/11 1600  WBC 13.8*  RBC 4.97  HCT 45.0  PLT 279    Basename 12/26/11 1600  NA 141  K 4.1  CL 102  CO2 29  BUN 7  CREATININE 0.45*  GLUCOSE 101*  CALCIUM 9.9   No results found for this basename: LABPT:2,INR:2 in the last 72 hours  Neurovascular intact Sensation intact distally Intact pulses distally Incision: no drainage Compartment soft  Assessment/Plan: 1 Day Post-Op Procedure(s) (LRB): INTRAMEDULLARY (IM) NAIL TIBIAL (Right) ARTHROSCOPY KNEE (Left) Discharge home with home health  [name] 12/29/2011, 7:21 AM
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
## 2011-12-29 NOTE — Care Management Note (Signed)
    Page 1 of 2   12/29/2011     4:22:23 PM   CARE MANAGEMENT NOTE 12/29/2011  Patient:  [name], [name]   Account Number:  [id number]  Date Initiated:  12/29/2011  Documentation initiated by:  [name]  Subjective/Objective Assessment:   dx non-union rt tib/fib fracture, severe arthrofibrosis left knee; Exchange IM nail-rt tibia and bone grafting, left knee arthroscopy with lysis of adhesions, debridement, manipulation under anesthesia     Action/Plan:   CM spoke with patient. Patient plans to to go back to her home in [place], [place] where family will be caregivers. She already has RW, #N!, wheelchair. Will need CPM and HHPT upon discharge   Anticipated DC Date:  12/29/2011   Anticipated DC Plan:  HOME W HOME HEALTH SERVICES  In-house referral  NA      DC Planning Services  CM consult      [facility] Choice  HOME HEALTH   Choice offered to / List presented to:  [name]   DME arranged  NA      DME agency  NA     HH arranged  HH-2 PT      HH agency  [facility]   Status of service:  Completed, signed off Medicare Important Message given?  NO (If response is "NO", the following Medicare IM given date fields will be blank) Date Medicare IM given:   Date Additional Medicare IM given:    Discharge Disposition:  HOME W HOME HEALTH SERVICES  Per UR Regulation:  Reviewed for med. necessity/level of care/duration of stay  If discussed at Long Length of Stay Meetings, dates discussed:    Comments:  12/29/2011 [name] BSN CCM [phone] List of [facility] agencies placed on chart. CPM was ordered prior to pt's admission from [facility]. Spoke with [name] from [employer]. [facility] will provide [facility] services with start date of tomorrow. Pt woill discharge today.
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
## 2012-01-01 ENCOUNTER — Ambulatory Visit (HOSPITAL_COMMUNITY): Payer: 59

## 2012-01-03 ENCOUNTER — Encounter (HOSPITAL_COMMUNITY): Payer: Self-pay | Admitting: Orthopaedic Surgery

## 2012-01-03 ENCOUNTER — Ambulatory Visit (HOSPITAL_COMMUNITY): Payer: 59 | Admitting: Physical Therapy

## 2012-01-08 ENCOUNTER — Ambulatory Visit (HOSPITAL_COMMUNITY): Payer: 59 | Admitting: Physical Therapy

## 2012-01-10 ENCOUNTER — Ambulatory Visit (HOSPITAL_COMMUNITY): Payer: 59 | Admitting: Physical Therapy

## 2012-01-12 ENCOUNTER — Ambulatory Visit (HOSPITAL_COMMUNITY): Payer: 59

## 2012-01-15 ENCOUNTER — Ambulatory Visit (HOSPITAL_COMMUNITY): Payer: 59 | Admitting: Physical Therapy

## 2012-01-17 ENCOUNTER — Ambulatory Visit (HOSPITAL_COMMUNITY): Payer: 59

## 2012-01-19 ENCOUNTER — Ambulatory Visit (HOSPITAL_COMMUNITY): Payer: 59

## 2012-01-22 ENCOUNTER — Ambulatory Visit (HOSPITAL_COMMUNITY)
Admission: RE | Admit: 2012-01-22 | Discharge: 2012-01-22 | Disposition: A | Discharge status: Home / Self Care | Payer: 59 | Source: Ambulatory Visit | Attending: Orthopedic Surgery | Admitting: Orthopedic Surgery

## 2012-01-22 DIAGNOSIS — IMO0001 Reserved for inherently not codable concepts without codable children: Secondary | ICD-10-CM | POA: Insufficient documentation

## 2012-01-22 DIAGNOSIS — M25579 Pain in unspecified ankle and joints of unspecified foot: Secondary | ICD-10-CM | POA: Insufficient documentation

## 2012-01-22 DIAGNOSIS — E119 Type 2 diabetes mellitus without complications: Secondary | ICD-10-CM | POA: Insufficient documentation

## 2012-01-22 DIAGNOSIS — M25673 Stiffness of unspecified ankle, not elsewhere classified: Secondary | ICD-10-CM | POA: Insufficient documentation

## 2012-01-22 DIAGNOSIS — M25676 Stiffness of unspecified foot, not elsewhere classified: Secondary | ICD-10-CM | POA: Insufficient documentation

## 2012-01-22 DIAGNOSIS — M25569 Pain in unspecified knee: Secondary | ICD-10-CM | POA: Insufficient documentation

## 2012-01-22 DIAGNOSIS — R262 Difficulty in walking, not elsewhere classified: Secondary | ICD-10-CM | POA: Diagnosis present

## 2012-01-22 DIAGNOSIS — M6281 Muscle weakness (generalized): Secondary | ICD-10-CM | POA: Insufficient documentation
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
## 2012-01-24 ENCOUNTER — Ambulatory Visit (HOSPITAL_COMMUNITY)
Admission: RE | Admit: 2012-01-24 | Discharge: 2012-01-24 | Disposition: A | Discharge status: Home / Self Care | Payer: 59 | Source: Ambulatory Visit | Attending: Physical Therapy | Admitting: Physical Therapy
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
## 2012-01-31 ENCOUNTER — Ambulatory Visit (HOSPITAL_COMMUNITY)
Admission: RE | Admit: 2012-01-31 | Discharge: 2012-01-31 | Disposition: A | Discharge status: Home / Self Care | Payer: 59 | Source: Ambulatory Visit | Attending: Family Medicine | Admitting: Family Medicine
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
## 2012-02-01 ENCOUNTER — Ambulatory Visit (HOSPITAL_COMMUNITY)
Admission: RE | Admit: 2012-02-01 | Discharge: 2012-02-01 | Payer: 59 | Source: Ambulatory Visit | Attending: *Deleted | Admitting: *Deleted
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
## 2012-02-05 ENCOUNTER — Ambulatory Visit (HOSPITAL_COMMUNITY)
Admission: RE | Admit: 2012-02-05 | Discharge: 2012-02-05 | Disposition: A | Discharge status: Home / Self Care | Payer: 59 | Source: Ambulatory Visit | Attending: Physical Therapy | Admitting: Physical Therapy
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
## 2012-02-07 ENCOUNTER — Ambulatory Visit (HOSPITAL_COMMUNITY)
Admission: RE | Admit: 2012-02-07 | Discharge: 2012-02-07 | Disposition: A | Discharge status: Home / Self Care | Payer: 59 | Source: Ambulatory Visit | Attending: Family Medicine | Admitting: Family Medicine
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
## 2012-02-08 ENCOUNTER — Ambulatory Visit (HOSPITAL_COMMUNITY)
Admission: RE | Admit: 2012-02-08 | Discharge: 2012-02-08 | Disposition: A | Discharge status: Home / Self Care | Payer: 59 | Source: Ambulatory Visit | Attending: Family Medicine | Admitting: Family Medicine
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
## 2012-02-09 ENCOUNTER — Ambulatory Visit
Admission: RE | Admit: 2012-02-09 | Discharge: 2012-02-09 | Disposition: A | Discharge status: Home / Self Care | Payer: 59 | Source: Ambulatory Visit | Attending: Obstetrics and Gynecology | Admitting: Obstetrics and Gynecology

## 2012-02-09 DIAGNOSIS — R928 Other abnormal and inconclusive findings on diagnostic imaging of breast: Secondary | ICD-10-CM

## 2012-02-12 ENCOUNTER — Ambulatory Visit (HOSPITAL_COMMUNITY): Payer: 59 | Admitting: Physical Therapy

## 2012-02-13 ENCOUNTER — Ambulatory Visit (HOSPITAL_COMMUNITY)
Admission: RE | Admit: 2012-02-13 | Discharge: 2012-02-13 | Disposition: A | Discharge status: Home / Self Care | Payer: 59 | Source: Ambulatory Visit | Attending: Family Medicine | Admitting: Family Medicine
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
## 2012-02-14 ENCOUNTER — Ambulatory Visit (HOSPITAL_COMMUNITY): Payer: 59 | Admitting: *Deleted

## 2012-02-15 ENCOUNTER — Ambulatory Visit (HOSPITAL_COMMUNITY)
Admission: RE | Admit: 2012-02-15 | Discharge: 2012-02-15 | Disposition: A | Discharge status: Home / Self Care | Payer: 59 | Source: Ambulatory Visit | Attending: Family Medicine | Admitting: Family Medicine
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
## 2012-02-19 ENCOUNTER — Ambulatory Visit (HOSPITAL_COMMUNITY)
Admission: RE | Admit: 2012-02-19 | Discharge: 2012-02-19 | Disposition: A | Discharge status: Home / Self Care | Payer: 59 | Source: Ambulatory Visit | Attending: Physical Therapy | Admitting: Physical Therapy
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
## 2012-02-20 ENCOUNTER — Other Ambulatory Visit: Payer: Self-pay | Admitting: *Deleted

## 2012-02-20 MED ORDER — METOPROLOL TARTRATE 25 MG PO TABS: 25.0000 mg | ORAL_TABLET | Freq: Two times a day (BID) | ORAL | Status: DC

## 2012-02-20 MED ORDER — BENAZEPRIL HCL 5 MG PO TABS: 5.0000 mg | ORAL_TABLET | Freq: Every day | ORAL | Status: DC

## 2012-02-21 ENCOUNTER — Ambulatory Visit (HOSPITAL_COMMUNITY)
Admission: RE | Admit: 2012-02-21 | Discharge: 2012-02-21 | Disposition: A | Discharge status: Home / Self Care | Payer: 59 | Source: Ambulatory Visit | Attending: Family Medicine | Admitting: Family Medicine
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
## 2012-02-22 ENCOUNTER — Ambulatory Visit (HOSPITAL_COMMUNITY)
Admission: RE | Admit: 2012-02-22 | Discharge: 2012-02-22 | Disposition: A | Discharge status: Home / Self Care | Payer: 59 | Source: Ambulatory Visit | Attending: Orthopedic Surgery | Admitting: Orthopedic Surgery

## 2012-02-22 DIAGNOSIS — IMO0001 Reserved for inherently not codable concepts without codable children: Secondary | ICD-10-CM | POA: Insufficient documentation

## 2012-02-22 DIAGNOSIS — M6281 Muscle weakness (generalized): Secondary | ICD-10-CM | POA: Insufficient documentation

## 2012-02-22 DIAGNOSIS — M25676 Stiffness of unspecified foot, not elsewhere classified: Secondary | ICD-10-CM | POA: Insufficient documentation

## 2012-02-22 DIAGNOSIS — M25673 Stiffness of unspecified ankle, not elsewhere classified: Secondary | ICD-10-CM | POA: Insufficient documentation

## 2012-02-22 DIAGNOSIS — R262 Difficulty in walking, not elsewhere classified: Secondary | ICD-10-CM | POA: Insufficient documentation

## 2012-02-22 DIAGNOSIS — M25579 Pain in unspecified ankle and joints of unspecified foot: Secondary | ICD-10-CM | POA: Insufficient documentation

## 2012-02-22 DIAGNOSIS — E119 Type 2 diabetes mellitus without complications: Secondary | ICD-10-CM | POA: Insufficient documentation
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
## 2012-02-26 ENCOUNTER — Ambulatory Visit (HOSPITAL_COMMUNITY)
Admission: RE | Admit: 2012-02-26 | Discharge: 2012-02-26 | Disposition: A | Discharge status: Home / Self Care | Payer: 59 | Source: Ambulatory Visit | Attending: Physical Therapy | Admitting: Physical Therapy
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
## 2012-02-29 ENCOUNTER — Ambulatory Visit (HOSPITAL_COMMUNITY)
Admission: RE | Admit: 2012-02-29 | Discharge: 2012-02-29 | Disposition: A | Discharge status: Home / Self Care | Payer: 59 | Source: Ambulatory Visit | Attending: Family Medicine | Admitting: Family Medicine
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
## 2012-03-04 ENCOUNTER — Ambulatory Visit (HOSPITAL_COMMUNITY)
Admission: RE | Admit: 2012-03-04 | Discharge: 2012-03-04 | Disposition: A | Discharge status: Home / Self Care | Payer: 59 | Source: Ambulatory Visit | Attending: Family Medicine | Admitting: Family Medicine
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
## 2012-03-06 ENCOUNTER — Ambulatory Visit (HOSPITAL_COMMUNITY)
Admission: RE | Admit: 2012-03-06 | Discharge: 2012-03-06 | Disposition: A | Discharge status: Home / Self Care | Payer: 59 | Source: Ambulatory Visit | Attending: Family Medicine | Admitting: Family Medicine
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
## 2012-03-07 ENCOUNTER — Ambulatory Visit (HOSPITAL_COMMUNITY): Payer: 59 | Admitting: *Deleted

## 2012-03-11 ENCOUNTER — Ambulatory Visit (HOSPITAL_COMMUNITY): Payer: 59 | Admitting: Physical Therapy

## 2012-03-12 ENCOUNTER — Other Ambulatory Visit (HOSPITAL_COMMUNITY): Payer: Self-pay | Admitting: Orthopaedic Surgery

## 2012-03-12 ENCOUNTER — Other Ambulatory Visit (HOSPITAL_COMMUNITY): Payer: Self-pay

## 2012-03-13 ENCOUNTER — Ambulatory Visit (HOSPITAL_COMMUNITY): Payer: 59 | Admitting: *Deleted

## 2012-03-13 ENCOUNTER — Encounter (HOSPITAL_COMMUNITY): Payer: Self-pay | Admitting: Pharmacy Technician

## 2012-03-14 ENCOUNTER — Encounter (HOSPITAL_COMMUNITY)
Admission: RE | Admit: 2012-03-14 | Discharge: 2012-03-14 | Disposition: A | Discharge status: Home / Self Care | Payer: 59 | Source: Ambulatory Visit | Attending: Orthopaedic Surgery | Admitting: Orthopaedic Surgery

## 2012-03-14 ENCOUNTER — Ambulatory Visit (HOSPITAL_COMMUNITY): Payer: 59 | Admitting: *Deleted

## 2012-03-14 ENCOUNTER — Encounter (HOSPITAL_COMMUNITY): Payer: Self-pay

## 2012-03-14 LAB — URINE MICROSCOPIC-ADD ON

## 2012-03-14 LAB — CBC
HCT: 43.8 % (ref 36.0–46.0)
Hemoglobin: 14.5 g/dL (ref 12.0–15.0)
MCHC: 33.1 g/dL (ref 30.0–36.0)
MCV: 92.8 fL (ref 78.0–100.0)

## 2012-03-14 LAB — BASIC METABOLIC PANEL
BUN: 8 mg/dL (ref 6–23)
CO2: 29 mEq/L (ref 19–32)
Chloride: 101 mEq/L (ref 96–112)
GFR calc Af Amer: >90 mL/min (ref >90)
Glucose, Bld: 111 mg/dL — ABNORMAL HIGH (ref 70–99)
Potassium: 4.4 mEq/L (ref 3.5–5.1)

## 2012-03-14 LAB — URINALYSIS, ROUTINE W REFLEX MICROSCOPIC
Bilirubin Urine: NEGATIVE
Glucose, UA: NEGATIVE mg/dL
Protein, ur: NEGATIVE mg/dL

## 2012-03-14 LAB — SURGICAL PCR SCREEN: Staphylococcus aureus: NEGATIVE

## 2012-03-14 LAB — TYPE AND SCREEN
ABO/RH(D): A POS
Antibody Screen: NEGATIVE

## 2012-03-14 NOTE — Pre-Procedure Instructions (Addendum)
20 [name]  03/14/2012   Your procedure is scheduled on:  Tuesday, August 27th.  Report to [facility] at 12Noon.  Call this number if you have problems the morning of surgery: [phone]   Remember:   Do not eat food or drink any liquid:After Midnight.  May have clear liquids:until Midnight .     Take these medicines the morning of surgery with A SIP OF WATER: Metoprolol (Lopressor).  May take Alprazolam ( Xanax) and Hydrocodone- Acetaminophen (Percocet) if needed.   Stop taking Clopidogrel (Plavix), Fish Oil. Do not take any Aspirin, NSAIDs (Ibuprofen, Naproxen) or Herbal Medications.    Do not wear jewelry, make-up or nail polish.  Do not wear lotions, powders, or perfumes. You may wear deodorant.  Do not shave 48 hours prior to surgery. Men may shave face and neck.  Do not bring valuables to the hospital.  Contacts, dentures or bridgework may not be worn into surgery.  Leave suitcase in the car. After surgery it may be brought to your room.  For patients admitted to the hospital, checkout time is 11:00 AM the day of discharge.   Patients discharged the day of surgery will not be allowed to drive home.  Name and phone number of your driver:NA  Special Instructions: CHG Shower Use Special Wash: 1/2 bottle night before surgery and 1/2 bottle morning of surgery.   Please read over the following fact sheets that you were given: Pain Booklet, Coughing and Deep Breathing, Blood Transfusion Information and Surgical Site Infection Prevention

## 2012-03-18 ENCOUNTER — Ambulatory Visit (HOSPITAL_COMMUNITY): Payer: 59 | Admitting: Physical Therapy

## 2012-03-18 MED ORDER — CLINDAMYCIN PHOSPHATE 900 MG/50ML IV SOLN
900.0000 mg | INTRAVENOUS | Status: AC
  Administered 2012-03-19: 900 mg via INTRAVENOUS
  Filled 2012-03-18: qty 50

## 2012-03-19 ENCOUNTER — Inpatient Hospital Stay (HOSPITAL_COMMUNITY): Payer: 59 | Admitting: Anesthesiology

## 2012-03-19 ENCOUNTER — Inpatient Hospital Stay (HOSPITAL_COMMUNITY)
Admission: RE | Admit: 2012-03-19 | Discharge: 2012-03-21 | DRG: 470 | Disposition: A | Discharge status: Home Health | Payer: 59 | Source: Ambulatory Visit | Attending: Orthopaedic Surgery | Admitting: Orthopaedic Surgery

## 2012-03-19 ENCOUNTER — Encounter (HOSPITAL_COMMUNITY): Payer: Self-pay | Admitting: Anesthesiology

## 2012-03-19 ENCOUNTER — Inpatient Hospital Stay (HOSPITAL_COMMUNITY): Payer: 59

## 2012-03-19 ENCOUNTER — Encounter (HOSPITAL_COMMUNITY): Payer: Self-pay | Admitting: *Deleted

## 2012-03-19 ENCOUNTER — Encounter (HOSPITAL_COMMUNITY)
Admission: RE | Disposition: A | Discharge status: Home Health | Payer: Self-pay | Source: Ambulatory Visit | Attending: Orthopaedic Surgery

## 2012-03-19 DIAGNOSIS — I2510 Atherosclerotic heart disease of native coronary artery without angina pectoris: Secondary | ICD-10-CM | POA: Diagnosis present

## 2012-03-19 DIAGNOSIS — Z01812 Encounter for preprocedural laboratory examination: Secondary | ICD-10-CM

## 2012-03-19 DIAGNOSIS — I10 Essential (primary) hypertension: Secondary | ICD-10-CM | POA: Diagnosis present

## 2012-03-19 DIAGNOSIS — M12559 Traumatic arthropathy, unspecified hip: Principal | ICD-10-CM | POA: Diagnosis present

## 2012-03-19 DIAGNOSIS — D62 Acute posthemorrhagic anemia: Secondary | ICD-10-CM | POA: Diagnosis not present

## 2012-03-19 DIAGNOSIS — E119 Type 2 diabetes mellitus without complications: Secondary | ICD-10-CM | POA: Diagnosis present

## 2012-03-19 DIAGNOSIS — M1710 Unilateral primary osteoarthritis, unspecified knee: Secondary | ICD-10-CM | POA: Diagnosis present

## 2012-03-19 DIAGNOSIS — F411 Generalized anxiety disorder: Secondary | ICD-10-CM | POA: Diagnosis present

## 2012-03-19 DIAGNOSIS — M169 Osteoarthritis of hip, unspecified: Secondary | ICD-10-CM

## 2012-03-19 DIAGNOSIS — F17200 Nicotine dependence, unspecified, uncomplicated: Secondary | ICD-10-CM | POA: Diagnosis present

## 2012-03-19 DIAGNOSIS — E785 Hyperlipidemia, unspecified: Secondary | ICD-10-CM | POA: Diagnosis present

## 2012-03-19 DIAGNOSIS — I6529 Occlusion and stenosis of unspecified carotid artery: Secondary | ICD-10-CM | POA: Diagnosis present

## 2012-03-19 HISTORY — PX: TOTAL HIP ARTHROPLASTY: SHX124

## 2012-03-19 LAB — GLUCOSE, CAPILLARY: Glucose-Capillary: 159 mg/dL — ABNORMAL HIGH (ref 70–99)

## 2012-03-19 SURGERY — ARTHROPLASTY, HIP, TOTAL, ANTERIOR APPROACH
Anesthesia: General | Site: Hip | Laterality: Left | Wound class: Clean

## 2012-03-19 MED ORDER — NITROGLYCERIN 0.4 MG SL SUBL: 0.4000 mg | SUBLINGUAL_TABLET | SUBLINGUAL | Status: DC | PRN

## 2012-03-19 MED ORDER — MORPHINE SULFATE (PF) 1 MG/ML IV SOLN
INTRAVENOUS | Status: AC
  Filled 2012-03-19: qty 25

## 2012-03-19 MED ORDER — MORPHINE SULFATE (PF) 1 MG/ML IV SOLN
INTRAVENOUS | Status: DC
  Administered 2012-03-19: 16:00:00 via INTRAVENOUS
  Administered 2012-03-20: 6 mg via INTRAVENOUS
  Administered 2012-03-20: 08:00:00 via INTRAVENOUS
  Administered 2012-03-20: 6 mg via INTRAVENOUS
  Filled 2012-03-19 (×2): qty 25

## 2012-03-19 MED ORDER — LINAGLIPTIN 5 MG PO TABS
5.0000 mg | ORAL_TABLET | Freq: Every day | ORAL | Status: DC
  Filled 2012-03-19 (×3): qty 1

## 2012-03-19 MED ORDER — ACETAMINOPHEN 650 MG RE SUPP: 650.0000 mg | Freq: Four times a day (QID) | RECTAL | Status: DC | PRN

## 2012-03-19 MED ORDER — ASPIRIN 81 MG PO CHEW
162.0000 mg | CHEWABLE_TABLET | Freq: Every day | ORAL | Status: DC
  Administered 2012-03-20 – 2012-03-21 (×2): 162 mg via ORAL
  Filled 2012-03-19 (×2): qty 2

## 2012-03-19 MED ORDER — LACTATED RINGERS IV SOLN
INTRAVENOUS | Status: DC | PRN
  Administered 2012-03-19 (×3): via INTRAVENOUS

## 2012-03-19 MED ORDER — ACETAMINOPHEN 325 MG PO TABS: 650.0000 mg | ORAL_TABLET | Freq: Four times a day (QID) | ORAL | Status: DC | PRN

## 2012-03-19 MED ORDER — BENAZEPRIL HCL 5 MG PO TABS
5.0000 mg | ORAL_TABLET | Freq: Every day | ORAL | Status: DC
  Administered 2012-03-21: 5 mg via ORAL
  Filled 2012-03-19 (×2): qty 1

## 2012-03-19 MED ORDER — NEOSTIGMINE METHYLSULFATE 1 MG/ML IJ SOLN
INTRAMUSCULAR | Status: DC | PRN
  Administered 2012-03-19: 4 mg via INTRAVENOUS

## 2012-03-19 MED ORDER — METHOCARBAMOL 100 MG/ML IJ SOLN
500.0000 mg | Freq: Four times a day (QID) | INTRAVENOUS | Status: DC | PRN
  Administered 2012-03-19: 500 mg via INTRAVENOUS
  Filled 2012-03-19: qty 5

## 2012-03-19 MED ORDER — PROPOFOL 10 MG/ML IV EMUL
INTRAVENOUS | Status: DC | PRN
  Administered 2012-03-19: 200 mg via INTRAVENOUS

## 2012-03-19 MED ORDER — EZETIMIBE-SIMVASTATIN 10-40 MG PO TABS
1.0000 | ORAL_TABLET | Freq: Every day | ORAL | Status: DC
  Administered 2012-03-19 – 2012-03-20 (×2): 1 via ORAL
  Filled 2012-03-19 (×3): qty 1

## 2012-03-19 MED ORDER — DIPHENHYDRAMINE HCL 12.5 MG/5ML PO ELIX
12.5000 mg | ORAL_SOLUTION | Freq: Four times a day (QID) | ORAL | Status: DC | PRN
  Filled 2012-03-19: qty 5

## 2012-03-19 MED ORDER — MIDAZOLAM HCL 5 MG/5ML IJ SOLN
INTRAMUSCULAR | Status: DC | PRN
  Administered 2012-03-19: 2 mg via INTRAVENOUS

## 2012-03-19 MED ORDER — ALUM & MAG HYDROXIDE-SIMETH 200-200-20 MG/5ML PO SUSP: 30.0000 mL | ORAL | Status: DC | PRN

## 2012-03-19 MED ORDER — HYDROMORPHONE HCL PF 1 MG/ML IJ SOLN
0.2500 mg | INTRAMUSCULAR | Status: DC | PRN
  Administered 2012-03-19 (×2): 0.5 mg via INTRAVENOUS

## 2012-03-19 MED ORDER — SCOPOLAMINE 1 MG/3DAYS TD PT72
MEDICATED_PATCH | TRANSDERMAL | Status: DC | PRN
  Administered 2012-03-19: 1 via TRANSDERMAL

## 2012-03-19 MED ORDER — SITAGLIPTIN PHOS-METFORMIN HCL 50-500 MG PO TABS: 1.0000 | ORAL_TABLET | Freq: Two times a day (BID) | ORAL | Status: DC

## 2012-03-19 MED ORDER — ONDANSETRON HCL 4 MG/2ML IJ SOLN: 4.0000 mg | Freq: Four times a day (QID) | INTRAMUSCULAR | Status: DC | PRN

## 2012-03-19 MED ORDER — HYDROMORPHONE HCL PF 1 MG/ML IJ SOLN
INTRAMUSCULAR | Status: AC
  Filled 2012-03-19: qty 1

## 2012-03-19 MED ORDER — ACETAMINOPHEN 10 MG/ML IV SOLN
INTRAVENOUS | Status: DC | PRN
  Administered 2012-03-19: 1000 mg via INTRAVENOUS

## 2012-03-19 MED ORDER — OXYCODONE HCL 5 MG/5ML PO SOLN: 5.0000 mg | Freq: Once | ORAL | Status: DC | PRN

## 2012-03-19 MED ORDER — LACTATED RINGERS IV SOLN
INTRAVENOUS | Status: DC
  Administered 2012-03-19: 12:00:00 via INTRAVENOUS

## 2012-03-19 MED ORDER — OXYCODONE HCL 5 MG PO TABS: 5.0000 mg | ORAL_TABLET | Freq: Once | ORAL | Status: DC | PRN

## 2012-03-19 MED ORDER — ACETAMINOPHEN 10 MG/ML IV SOLN
INTRAVENOUS | Status: AC
  Filled 2012-03-19: qty 100

## 2012-03-19 MED ORDER — ONDANSETRON HCL 4 MG/2ML IJ SOLN
INTRAMUSCULAR | Status: DC | PRN
  Administered 2012-03-19: 4 mg via INTRAVENOUS

## 2012-03-19 MED ORDER — MENTHOL 3 MG MT LOZG: 1.0000 | LOZENGE | OROMUCOSAL | Status: DC | PRN

## 2012-03-19 MED ORDER — METHOCARBAMOL 500 MG PO TABS
500.0000 mg | ORAL_TABLET | Freq: Four times a day (QID) | ORAL | Status: DC | PRN
  Administered 2012-03-20 – 2012-03-21 (×3): 500 mg via ORAL
  Filled 2012-03-19 (×4): qty 1

## 2012-03-19 MED ORDER — METOCLOPRAMIDE HCL 5 MG/ML IJ SOLN: 5.0000 mg | Freq: Three times a day (TID) | INTRAMUSCULAR | Status: DC | PRN

## 2012-03-19 MED ORDER — METFORMIN HCL 850 MG PO TABS
850.0000 mg | ORAL_TABLET | Freq: Every day | ORAL | Status: DC
  Filled 2012-03-19 (×3): qty 1

## 2012-03-19 MED ORDER — ONDANSETRON HCL 4 MG PO TABS: 4.0000 mg | ORAL_TABLET | Freq: Four times a day (QID) | ORAL | Status: DC | PRN

## 2012-03-19 MED ORDER — PHENYLEPHRINE HCL 10 MG/ML IJ SOLN
INTRAMUSCULAR | Status: DC | PRN
  Administered 2012-03-19 (×2): 80 ug via INTRAVENOUS

## 2012-03-19 MED ORDER — CLOPIDOGREL BISULFATE 75 MG PO TABS
75.0000 mg | ORAL_TABLET | Freq: Every day | ORAL | Status: DC
  Administered 2012-03-20 – 2012-03-21 (×2): 75 mg via ORAL
  Filled 2012-03-19 (×3): qty 1

## 2012-03-19 MED ORDER — GLYCOPYRROLATE 0.2 MG/ML IJ SOLN
INTRAMUSCULAR | Status: DC | PRN
  Administered 2012-03-19: .5 mg via INTRAVENOUS

## 2012-03-19 MED ORDER — VECURONIUM BROMIDE 10 MG IV SOLR
INTRAVENOUS | Status: DC | PRN
  Administered 2012-03-19: 6 mg via INTRAVENOUS
  Administered 2012-03-19 (×2): 2 mg via INTRAVENOUS

## 2012-03-19 MED ORDER — OXYCODONE HCL 5 MG PO TABS
5.0000 mg | ORAL_TABLET | ORAL | Status: DC | PRN
  Administered 2012-03-20: 10 mg via ORAL
  Administered 2012-03-20: 5 mg via ORAL
  Administered 2012-03-20 – 2012-03-21 (×4): 10 mg via ORAL
  Filled 2012-03-19: qty 1
  Filled 2012-03-19 (×5): qty 2

## 2012-03-19 MED ORDER — ZOLPIDEM TARTRATE 5 MG PO TABS: 5.0000 mg | ORAL_TABLET | Freq: Every evening | ORAL | Status: DC | PRN

## 2012-03-19 MED ORDER — DIPHENHYDRAMINE HCL 50 MG/ML IJ SOLN
12.5000 mg | Freq: Four times a day (QID) | INTRAMUSCULAR | Status: DC | PRN
  Filled 2012-03-19: qty 0.25

## 2012-03-19 MED ORDER — ONDANSETRON HCL 4 MG/2ML IJ SOLN
4.0000 mg | Freq: Four times a day (QID) | INTRAMUSCULAR | Status: DC | PRN
  Filled 2012-03-19: qty 2

## 2012-03-19 MED ORDER — SODIUM CHLORIDE 0.9 % IJ SOLN: 9.0000 mL | INTRAMUSCULAR | Status: DC | PRN

## 2012-03-19 MED ORDER — INSULIN ASPART 100 UNIT/ML ~~LOC~~ SOLN: 0.0000 [IU] | Freq: Three times a day (TID) | SUBCUTANEOUS | Status: DC

## 2012-03-19 MED ORDER — METOPROLOL TARTRATE 25 MG PO TABS
25.0000 mg | ORAL_TABLET | Freq: Two times a day (BID) | ORAL | Status: DC
  Administered 2012-03-20 – 2012-03-21 (×2): 25 mg via ORAL
  Filled 2012-03-19 (×5): qty 1

## 2012-03-19 MED ORDER — DIPHENHYDRAMINE HCL 12.5 MG/5ML PO ELIX: 12.5000 mg | ORAL_SOLUTION | ORAL | Status: DC | PRN

## 2012-03-19 MED ORDER — KETOROLAC TROMETHAMINE 15 MG/ML IJ SOLN
7.5000 mg | Freq: Four times a day (QID) | INTRAMUSCULAR | Status: AC
  Administered 2012-03-19 – 2012-03-20 (×4): 7.5 mg via INTRAVENOUS
  Filled 2012-03-19 (×4): qty 1

## 2012-03-19 MED ORDER — DOCUSATE SODIUM 100 MG PO CAPS
100.0000 mg | ORAL_CAPSULE | Freq: Two times a day (BID) | ORAL | Status: DC
  Administered 2012-03-19 – 2012-03-21 (×4): 100 mg via ORAL
  Filled 2012-03-19 (×5): qty 1

## 2012-03-19 MED ORDER — CLINDAMYCIN PHOSPHATE 600 MG/50ML IV SOLN
600.0000 mg | Freq: Four times a day (QID) | INTRAVENOUS | Status: AC
  Administered 2012-03-19 – 2012-03-20 (×2): 600 mg via INTRAVENOUS
  Filled 2012-03-19 (×2): qty 50

## 2012-03-19 MED ORDER — METOCLOPRAMIDE HCL 5 MG/ML IJ SOLN: 10.0000 mg | Freq: Once | INTRAMUSCULAR | Status: DC | PRN

## 2012-03-19 MED ORDER — ALPRAZOLAM 0.5 MG PO TABS
0.5000 mg | ORAL_TABLET | Freq: Two times a day (BID) | ORAL | Status: DC | PRN
  Administered 2012-03-20: 0.5 mg via ORAL
  Filled 2012-03-19: qty 1

## 2012-03-19 MED ORDER — METOCLOPRAMIDE HCL 10 MG PO TABS: 5.0000 mg | ORAL_TABLET | Freq: Three times a day (TID) | ORAL | Status: DC | PRN

## 2012-03-19 MED ORDER — PHENOL 1.4 % MT LIQD: 1.0000 | OROMUCOSAL | Status: DC | PRN

## 2012-03-19 MED ORDER — NALOXONE HCL 0.4 MG/ML IJ SOLN
0.4000 mg | INTRAMUSCULAR | Status: DC | PRN
  Filled 2012-03-19: qty 1

## 2012-03-19 MED ORDER — MORPHINE SULFATE 2 MG/ML IJ SOLN: 2.0000 mg | INTRAMUSCULAR | Status: DC | PRN

## 2012-03-19 MED ORDER — SODIUM CHLORIDE 0.9 % IV SOLN
INTRAVENOUS | Status: DC
  Administered 2012-03-20: 1000 mL via INTRAVENOUS

## 2012-03-19 MED ORDER — LIDOCAINE HCL (CARDIAC) 20 MG/ML IV SOLN
INTRAVENOUS | Status: DC | PRN
  Administered 2012-03-19: 50 mg via INTRAVENOUS

## 2012-03-19 MED ORDER — EPHEDRINE SULFATE 50 MG/ML IJ SOLN
INTRAMUSCULAR | Status: DC | PRN
  Administered 2012-03-19: 10 mg via INTRAVENOUS

## 2012-03-19 MED ORDER — FENTANYL CITRATE 0.05 MG/ML IJ SOLN
INTRAMUSCULAR | Status: DC | PRN
  Administered 2012-03-19: 100 ug via INTRAVENOUS
  Administered 2012-03-19 (×3): 50 ug via INTRAVENOUS
  Administered 2012-03-19: 100 ug via INTRAVENOUS
  Administered 2012-03-19 (×3): 50 ug via INTRAVENOUS

## 2012-03-19 MED ORDER — 0.9 % SODIUM CHLORIDE (POUR BTL) OPTIME
TOPICAL | Status: DC | PRN
  Administered 2012-03-19: 1000 mL

## 2012-03-19 SURGICAL SUPPLY — 56 items
BLADE SAW SAG 73X25 THK (BLADE) ×1
BLADE SAW SGTL 18X1.27X75 (BLADE) ×2 IMPLANT
BLADE SAW SGTL 73X25 THK (BLADE) ×1 IMPLANT
BLADE SURG 10 STRL SS (BLADE) ×2 IMPLANT
BLADE SURG ROTATE 9660 (MISCELLANEOUS) IMPLANT
BRUSH FEMORAL CANAL (MISCELLANEOUS) IMPLANT
CELLS DAT CNTRL 66122 CELL SVR (MISCELLANEOUS) ×1 IMPLANT
CLOTH BEACON ORANGE TIMEOUT ST (SAFETY) ×2 IMPLANT
COVER BACK TABLE 24X17X13 BIG (DRAPES) IMPLANT
COVER SURGICAL LIGHT HANDLE (MISCELLANEOUS) ×2 IMPLANT
DRAPE C-ARM 42X72 X-RAY (DRAPES) ×2 IMPLANT
DRAPE STERI IOBAN 125X83 (DRAPES) ×2 IMPLANT
DRAPE U-SHAPE 47X51 STRL (DRAPES) ×6 IMPLANT
DRSG MEPILEX BORDER 4X12 (GAUZE/BANDAGES/DRESSINGS) IMPLANT
DRSG MEPILEX BORDER 4X8 (GAUZE/BANDAGES/DRESSINGS) ×2 IMPLANT
DURAPREP 26ML APPLICATOR (WOUND CARE) ×2 IMPLANT
ELECT BLADE 4.0 EZ CLEAN MEGAD (MISCELLANEOUS)
ELECT BLADE TIP CTD 4 INCH (ELECTRODE) ×2 IMPLANT
ELECT CAUTERY BLADE 6.4 (BLADE) ×2 IMPLANT
ELECT REM PT RETURN 9FT ADLT (ELECTROSURGICAL) ×2
ELECTRODE BLDE 4.0 EZ CLN MEGD (MISCELLANEOUS) IMPLANT
ELECTRODE REM PT RTRN 9FT ADLT (ELECTROSURGICAL) ×1 IMPLANT
FACESHIELD LNG OPTICON STERILE (SAFETY) ×4 IMPLANT
GAUZE XEROFORM 1X8 LF (GAUZE/BANDAGES/DRESSINGS) ×2 IMPLANT
GLOVE BIOGEL PI IND STRL 7.5 (GLOVE) ×1 IMPLANT
GLOVE BIOGEL PI IND STRL 8 (GLOVE) ×1 IMPLANT
GLOVE BIOGEL PI INDICATOR 7.5 (GLOVE) ×1
GLOVE BIOGEL PI INDICATOR 8 (GLOVE) ×1
GLOVE ECLIPSE 7.0 STRL STRAW (GLOVE) ×2 IMPLANT
GLOVE ORTHO TXT STRL SZ7.5 (GLOVE) ×2 IMPLANT
GOWN PREVENTION PLUS LG XLONG (DISPOSABLE) IMPLANT
GOWN STRL NON-REIN LRG LVL3 (GOWN DISPOSABLE) ×4 IMPLANT
GOWN STRL REIN XL XLG (GOWN DISPOSABLE) ×2 IMPLANT
KIT BASIN OR (CUSTOM PROCEDURE TRAY) ×2 IMPLANT
KIT ROOM TURNOVER OR (KITS) ×2 IMPLANT
MANIFOLD NEPTUNE II (INSTRUMENTS) ×2 IMPLANT
NS IRRIG 1000ML POUR BTL (IV SOLUTION) ×2 IMPLANT
PACK TOTAL JOINT (CUSTOM PROCEDURE TRAY) ×2 IMPLANT
PAD ARMBOARD 7.5X6 YLW CONV (MISCELLANEOUS) ×4 IMPLANT
RTRCTR WOUND ALEXIS 18CM MED (MISCELLANEOUS) ×2
SPONGE LAP 18X18 X RAY DECT (DISPOSABLE) ×2 IMPLANT
SPONGE LAP 4X18 X RAY DECT (DISPOSABLE) IMPLANT
STAPLER SKIN PROX WIDE 3.9 (STAPLE) ×2 IMPLANT
STAPLER VISISTAT 35W (STAPLE) ×2 IMPLANT
SUCTION FRAZIER TIP 10 FR DISP (SUCTIONS) ×2 IMPLANT
SUT ETHIBOND NAB CT1 #1 30IN (SUTURE) ×4 IMPLANT
SUT VIC AB 0 CT1 27 (SUTURE) ×2
SUT VIC AB 0 CT1 27XBRD ANBCTR (SUTURE) ×2 IMPLANT
SUT VIC AB 1 CT1 27 (SUTURE) ×2
SUT VIC AB 1 CT1 27XBRD ANBCTR (SUTURE) ×2 IMPLANT
SUT VIC AB 2-0 CT1 27 (SUTURE) ×2
SUT VIC AB 2-0 CT1 TAPERPNT 27 (SUTURE) ×2 IMPLANT
TOWEL OR 17X24 6PK STRL BLUE (TOWEL DISPOSABLE) ×2 IMPLANT
TOWEL OR 17X26 10 PK STRL BLUE (TOWEL DISPOSABLE) ×4 IMPLANT
TRAY FOLEY CATH 14FR (SET/KITS/TRAYS/PACK) IMPLANT
WATER STERILE IRR 1000ML POUR (IV SOLUTION) ×4 IMPLANT

## 2012-03-19 NOTE — Anesthesia Postprocedure Evaluation (Signed)
  Anesthesia Post-op Note  Patient: [name]  Procedure(s) Performed: Procedure(s) (LRB): TOTAL HIP ARTHROPLASTY ANTERIOR APPROACH (Left)  Patient Location: PACU  Anesthesia Type: General  Level of Consciousness: awake, alert , oriented and patient cooperative  Airway and Oxygen Therapy: Patient Spontanous Breathing and Patient connected to nasal cannula oxygen  Post-op Pain: mild  Post-op Assessment: Post-op Vital signs reviewed, Patient's Cardiovascular Status Stable, Respiratory Function Stable, Patent Airway, No signs of Nausea or vomiting and Pain level controlled  Post-op Vital Signs: stable  Complications: No apparent anesthesia complications

## 2012-03-19 NOTE — Anesthesia Procedure Notes (Signed)
Procedure Name: Intubation Date/Time: 03/19/2012 12:42 PM Performed by: Carmela Rima Pre-anesthesia Checklist: Patient identified, Timeout performed, Emergency Drugs available, Suction available and Patient being monitored Patient Re-evaluated:Patient Re-evaluated prior to inductionOxygen Delivery Method: Circle system utilized Preoxygenation: Pre-oxygenation with 100% oxygen Intubation Type: IV induction Ventilation: Mask ventilation without difficulty Laryngoscope Size: Mac and 3 Grade View: Grade I Tube type: Oral Tube size: 7.5 mm Number of attempts: 1 Placement Confirmation: ETT inserted through vocal cords under direct vision,  breath sounds checked- equal and bilateral and positive ETCO2 Secured at: 22 cm Tube secured with: Tape Dental Injury: Teeth and Oropharynx as per pre-operative assessment

## 2012-03-19 NOTE — Anesthesia Preprocedure Evaluation (Addendum)
Anesthesia Evaluation  Patient identified by MRN, date of birth, ID band Patient awake    Reviewed: Allergy & Precautions, H&P , NPO status , Patient's Chart, lab work & pertinent test results, reviewed documented beta blocker date and time   Airway Mallampati: II TM Distance: >3 FB Neck ROM: full    Dental  (+) Dental Advidsory Given and Teeth Intact   Pulmonary neg pulmonary ROS,  breath sounds clear to auscultation        Cardiovascular hypertension, Pt. on medications and Pt. on home beta blockers + CAD, + Cardiac Stents and + Peripheral Vascular Disease Rhythm:regular     Neuro/Psych PSYCHIATRIC DISORDERS Anxiety negative neurological ROS     GI/Hepatic negative GI ROS, Neg liver ROS,   Endo/Other  Oral Hypoglycemic Agents  Renal/GU negative Renal ROS  negative genitourinary   Musculoskeletal   Abdominal   Peds  Hematology negative hematology ROS (+)   Anesthesia Other Findings See surgeon's H&P   Reproductive/Obstetrics negative OB ROS                          Anesthesia Physical Anesthesia Plan  ASA: III  Anesthesia Plan: General   Post-op Pain Management:    Induction: Intravenous  Airway Management Planned: Oral ETT  Additional Equipment:   Intra-op Plan:   Post-operative Plan: Extubation in OR  Informed Consent: I have reviewed the patients History and Physical, chart, labs and discussed the procedure including the risks, benefits and alternatives for the proposed anesthesia with the patient or authorized representative who has indicated his/her understanding and acceptance.   Dental Advisory Given  Plan Discussed with: CRNA, Surgeon and Anesthesiologist  Anesthesia Plan Comments:        Anesthesia Quick Evaluation
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
## 2012-03-19 NOTE — Brief Op Note (Signed)
03/19/2012  3:45 PM  PATIENT:  [name]  46 y.o. female  PRE-OPERATIVE DIAGNOSIS:  Severe post traumatic arthritis left hip  POST-OPERATIVE DIAGNOSIS:  Severe post traumatic arthritis left hip  PROCEDURE:  Procedure(s) (LRB): TOTAL HIP ARTHROPLASTY ANTERIOR APPROACH (Left)  SURGEON:  Surgeon(s) and Role:    * Kathryne Hitch, MD - Primary  PHYSICIAN ASSISTANT:   ASSISTANTS: Patrick Jupiter, RNFA   ANESTHESIA:   general  EBL:  Total I/O In: 2250 [I.V.:2250] Out: 600 [Urine:100; Blood:500]  BLOOD ADMINISTERED:none  DRAINS: none   LOCAL MEDICATIONS USED:  NONE  SPECIMEN:  No Specimen  DISPOSITION OF SPECIMEN:  N/A  COUNTS:  YES  TOURNIQUET:  * No tourniquets in log *  DICTATION: .Other Dictation: Dictation Number [id number]  PLAN OF CARE: Admit to inpatient   PATIENT DISPOSITION:  PACU - hemodynamically stable.   Delay start of Pharmacological VTE agent (>24hrs) due to surgical blood loss or risk of bleeding: no

## 2012-03-19 NOTE — Transfer of Care (Signed)
Immediate Anesthesia Transfer of Care Note  Patient: [name]  Procedure(s) Performed: Procedure(s) (LRB): TOTAL HIP ARTHROPLASTY ANTERIOR APPROACH (Left)  Patient Location: PACU  Anesthesia Type: General  Level of Consciousness: awake, alert  and oriented  Airway & Oxygen Therapy: Patient Spontanous Breathing and Patient connected to nasal cannula oxygen  Post-op Assessment: Report given to PACU RN and Post -op Vital signs reviewed and stable  Post vital signs: Reviewed and stable  Complications: No apparent anesthesia complications

## 2012-03-20 ENCOUNTER — Encounter (HOSPITAL_COMMUNITY): Payer: Self-pay | Admitting: General Practice

## 2012-03-20 ENCOUNTER — Ambulatory Visit (HOSPITAL_COMMUNITY): Payer: 59 | Admitting: Physical Therapy

## 2012-03-20 LAB — GLUCOSE, CAPILLARY: Glucose-Capillary: 150 mg/dL — ABNORMAL HIGH (ref 70–99)

## 2012-03-20 LAB — BASIC METABOLIC PANEL
BUN: 11 mg/dL (ref 6–23)
GFR calc Af Amer: >90 mL/min (ref >90)
GFR calc non Af Amer: >90 mL/min (ref >90)
Potassium: 4.3 mEq/L (ref 3.5–5.1)
Sodium: 138 mEq/L (ref 135–145)

## 2012-03-20 LAB — CBC
HCT: 31.1 % — ABNORMAL LOW (ref 36.0–46.0)
MCHC: 32.5 g/dL (ref 30.0–36.0)
RDW: 13.9 % (ref 11.5–15.5)
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
## 2012-03-20 NOTE — Progress Notes (Signed)
Occupational Therapy Note  OT order received and appreciated.  Per PT note, pt is at supervision-min guard with functional mobility.  Pt reports that she has no DME needs and is able to verbalize correct LB dressing technique.  Pt politely reports that she does not need OT.  Will sign off at this time. Please re-order if pt experiences functional decline. Thanks.  03/20/2012 Cipriano Mile OTR/L Pager [phone] Office [phone]
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
## 2012-03-20 NOTE — Progress Notes (Signed)
Subjective: 1 Day Post-Op Procedure(s) (LRB): TOTAL HIP ARTHROPLASTY ANTERIOR APPROACH (Left) Patient reports pain as moderate.  Asymptomatic acute blood loss anemia from surgery.  Objective: Vital signs in last 24 hours: Temp:  [97 F (36.1 C)-98.8 F (37.1 C)] 98.2 F (36.8 C) (08/28 0617) Pulse Rate:  [68-110] 110  (08/28 0617) Resp:  [10-18] 18  (08/28 0617) BP: (93-170)/(49-92) 109/49 mmHg (08/28 0617) SpO2:  [95 %-100 %] 98 % (08/28 0617) FiO2 (%):  [97 %] 97 % (08/28 0400) Weight:  [78.019 kg (172 lb)] 78.019 kg (172 lb) (08/27 1847)  Intake/Output from previous day: 08/27 0701 - 08/28 0700 In: 2305 [I.V.:2250; IV Piggyback:55] Out: 1225 [Urine:725; Blood:500] Intake/Output this shift:     Basename 03/20/12 0600  HGB 10.1*    Basename 03/20/12 0600  WBC 12.0*  RBC 3.26*  HCT 31.1*  PLT 211   No results found for this basename: NA:2,K:2,CL:2,CO2:2,BUN:2,CREATININE:2,GLUCOSE:2,CALCIUM:2 in the last 72 hours No results found for this basename: LABPT:2,INR:2 in the last 72 hours  Sensation intact distally Intact pulses distally Dorsiflexion/Plantar flexion intact Incision: scant drainage  Assessment/Plan: 1 Day Post-Op Procedure(s) (LRB): TOTAL HIP ARTHROPLASTY ANTERIOR APPROACH (Left) Up with therapy, WBAT left hip, no hip precautions  Treena Cosman Y 03/20/2012, 7:26 AM

## 2012-03-20 NOTE — Progress Notes (Signed)
Utilization review completed. Zen Felling, RN, BSN. 

## 2012-03-20 NOTE — Progress Notes (Signed)
Physical Therapy Progress Note   03/20/12 1400  PT Visit Information  Last PT Received On 03/20/12  Assistance Needed +1  PT Time Calculation  PT Start Time 1347  PT Stop Time 1422  PT Time Calculation (min) 35 min  Precautions  Precautions None  Restrictions  Weight Bearing Restrictions Yes  LLE Weight Bearing WBAT  Cognition  Overall Cognitive Status Appears within functional limits for tasks assessed/performed  Arousal/Alertness Awake/alert  Orientation Level Appears intact for tasks assessed  Behavior During Session [facility] for tasks performed  Bed Mobility  Bed Mobility Supine to Sit;Sit to Supine  Supine to Sit 4: Min assist;HOB flat;With rails  Sit to Supine 4: Min assist;HOB flat;With rail  Details for Bed Mobility Assistance (A) to elevate left LE OOB with max cues for technique  Transfers  Transfers Sit to Stand;Stand to Sit  Sit to Stand 4: Min guard;From bed;With armrests  Stand to Sit 4: Min guard;To bed  Details for Transfer Assistance Minguard for safety with cues for proper body position to tranfers safely to bed  Ambulation/Gait  Ambulation/Gait Assistance 5: Supervision  Ambulation Distance (Feet) 200 Feet  Assistive device Rolling walker  Ambulation/Gait Assistance Details Supervision for safety with cues to relax shoulders and look upright  Gait Pattern Step-to pattern;Shuffle;Trunk flexed  Exercises  Exercises Total Joint  Total Joint Exercises  Ankle Circles/Pumps AAROM;Both;10 reps  [employer];Left;10 reps  Short Arc Quad Strengthening;Left;10 reps  Heel Slides AAROM;Strengthening;Left;10 reps  PT - End of Session  Equipment Utilized During Treatment Gait belt  Activity Tolerance Patient tolerated treatment well  Patient left in bed;with call bell/phone within reach  Nurse Communication Mobility status  PT - Assessment/Plan  Comments on Treatment Session Pt able to increase ambulation and perform HEP.  Will continue to follow.    PT Plan Discharge plan remains appropriate;Frequency remains appropriate  PT Frequency 7X/week  Follow Up Recommendations Home health PT;Supervision/Assistance - 24 hour  Equipment Recommended None recommended by PT  Acute Rehab PT Goals  PT Goal Formulation With patient  Time For Goal Achievement 03/27/12  Potential to Achieve Goals Good  Pt will go Supine/Side to Sit with modified independence  PT Goal: Supine/Side to Sit - Progress Progressing toward goal  Pt will go Sit to Supine/Side with modified independence  PT Goal: Sit to Supine/Side - Progress Progressing toward goal  Pt will go Sit to Stand with modified independence  PT Goal: Sit to Stand - Progress Progressing toward goal  Pt will go Stand to Sit with modified independence  PT Goal: Stand to Sit - Progress Progressing toward goal  Pt will Perform Home Exercise Program Independently  PT Goal: Perform Home Exercise Program - Progress Progressing toward goal  PT General Charges  $$ ACUTE PT VISIT 1 Procedure  PT Treatments  $Gait Training 8-22 mins  $Therapeutic Activity 8-22 mins    [place], [place] DPT [phone]

## 2012-03-21 ENCOUNTER — Ambulatory Visit (HOSPITAL_COMMUNITY): Payer: 59 | Admitting: Physical Therapy

## 2012-03-21 LAB — GLUCOSE, CAPILLARY: Glucose-Capillary: 158 mg/dL — ABNORMAL HIGH (ref 70–99)

## 2012-03-21 LAB — CBC
Hemoglobin: 9.3 g/dL — ABNORMAL LOW (ref 12.0–15.0)
MCH: 30 pg (ref 26.0–34.0)
RBC: 3.1 MIL/uL — ABNORMAL LOW (ref 3.87–5.11)

## 2012-03-21 MED ORDER — OXYCODONE-ACETAMINOPHEN 10-325 MG PO TABS: 1.0000 | ORAL_TABLET | ORAL | Status: DC | PRN

## 2012-03-21 MED ORDER — CYCLOBENZAPRINE HCL 10 MG PO TABS: 10.0000 mg | ORAL_TABLET | Freq: Three times a day (TID) | ORAL | Status: AC | PRN

## 2012-03-21 NOTE — Discharge Summary (Signed)
Patient ID: [name] MRN: [id number] DOB/AGE: 44-Jul-1967 44 y.o.  Admit date: 03/19/2012 Discharge date: 03/21/2012  Admission Diagnoses:  Principal Problem:  *Degenerative arthritis of hip   Discharge Diagnoses:  Same  Past Medical History  Diagnosis Date  . CAD (coronary artery disease)     DES RCA for MI,11/2005 /  nuclear 10/2008 , 53%, no scar or ischemia  . Ejection fraction     55% cath 2007  /  53% nuclear, 10/2008, inferior hypo  . Carotid artery disease   . Tobacco abuse   . Dyslipidemia   . S/P hysterectomy     Very large fibroids.  . Anxiety   . Thyroid disorder     Left lobe of thyroid as abnormal appearance noted on carotid Doppler September 21,  . Hypertension   . Arthritis     L hip  . Cyst near coccyx   . Difficulty sleeping   . Stented coronary artery 2006  . Diabetes mellitus     Surgeries: Procedure(s): TOTAL HIP ARTHROPLASTY ANTERIOR APPROACH on 03/19/2012   Consultants:    Discharged Condition: Improved  Hospital Course: [name] is an 46 y.o. female who was admitted 03/19/2012 for operative treatment ofDegenerative arthritis of hip. Patient has severe unremitting pain that affects sleep, daily activities, and work/hobbies. After pre-op clearance the patient was taken to the operating room on 03/19/2012 and underwent  Procedure(s): TOTAL HIP ARTHROPLASTY ANTERIOR APPROACH.    Patient was given perioperative antibiotics: Anti-infectives     Start     Dose/Rate Route Frequency Ordered Stop   03/19/12 2030   clindamycin (CLEOCIN) IVPB 600 mg        600 mg 100 mL/hr over 30 Minutes Intravenous Every 6 hours 03/19/12 1903 03/20/12 0305   03/18/12 1442   clindamycin (CLEOCIN) IVPB 900 mg        900 mg 100 mL/hr over 30 Minutes Intravenous 60 min pre-op 03/18/12 1442 03/19/12 1258           Patient was given sequential compression devices, early ambulation, and chemoprophylaxis to prevent DVT.  Patient benefited maximally from hospital  stay and there were no complications.    Recent vital signs: Patient Vitals for the past 24 hrs:  BP Temp Pulse Resp SpO2  03/21/12 0655 120/68 mmHg 99.6 F (37.6 C) 88  18  98 %  03/21/12 0400 - - - 16  -  03/21/12 0000 - - - 16  -  03/20/12 2312 117/64 mmHg 99.5 F (37.5 C) 90  20  97 %  03/20/12 2000 - - - 16  -  03/20/12 1403 99/51 mmHg 98.8 F (37.1 C) 85  18  97 %  03/20/12 1200 - - - 14  99 %  03/20/12 0950 113/57 mmHg - 69  - -  03/20/12 0802 - - - 14  97 %     Recent laboratory studies:  Basename 03/20/12 0600  WBC 12.0*  HGB 10.1*  HCT 31.1*  PLT 211  NA 138  K 4.3  CL 101  CO2 28  BUN 11  CREATININE 0.55  GLUCOSE 128*  INR --  CALCIUM 8.6     Discharge Medications:   Medication List  As of 03/21/2012  7:12 AM   TAKE these medications         ALPRAZolam 0.5 MG tablet   Commonly known as: XANAX   Take 0.5 mg by mouth 2 (two) times daily as needed. Anxiety.  aspirin 81 MG chewable tablet   Chew 162 mg by mouth daily.      benazepril 5 MG tablet   Commonly known as: LOTENSIN   Take 1 tablet (5 mg total) by mouth daily.      clopidogrel 75 MG tablet   Commonly known as: PLAVIX   Take 75 mg by mouth daily.      cyclobenzaprine 10 MG tablet   Commonly known as: FLEXERIL   Take 1 tablet (10 mg total) by mouth 3 (three) times daily as needed for muscle spasms.      ezetimibe-simvastatin 10-40 MG per tablet   Commonly known as: VYTORIN   Take 1 tablet by mouth at bedtime.      fish oil-omega-3 fatty acids 1000 MG capsule   Take 3 g by mouth daily.      metoprolol tartrate 25 MG tablet   Commonly known as: LOPRESSOR   Take 1 tablet (25 mg total) by mouth 2 (two) times daily.      nitroGLYCERIN 0.4 MG SL tablet   Commonly known as: NITROSTAT   Place 0.4 mg under the tongue every 5 (five) minutes as needed. Chest pain      oxyCODONE-acetaminophen 10-325 MG per tablet   Commonly known as: PERCOCET   Take 1 tablet by mouth every 4 (four)  hours as needed for pain. Pain.      sitaGLIPtan-metformin 50-500 MG per tablet   Commonly known as: JANUMET   Take 1 tablet by mouth 2 (two) times daily with a meal.      VITAMIN C PO   Take 1,000 mg by mouth daily.            Diagnostic Studies: Dg Hip Operative Left  03/19/2012  *RADIOLOGY REPORT*  Clinical Data: Left total hip arthroplasty, anterior approach  OPERATIVE LEFT HIP  Comparison: Prior radiographs of the left hip dated 06/30/2011 and 07/13/2011  Findings: Fluoroscopic spot images demonstrate surgical changes of left total hip arthroplasty and stable changes of prior malleable plate and screw ORIF of a now healed complex posterior acetabular fracture.  The femoral head component appears well seated within the acetabular component.  No evidence of immediate hardware complication, or periprosthetic fracture.  IMPRESSION: Fluoroscopic spot images obtained at the time of anterior approach left total hip arthroplasty as detailed above.   Original Report Authenticated By: Alvino Blood Pelvis Portable  03/19/2012  *RADIOLOGY REPORT*  Clinical Data: Postoperative exam after left total hip arthroplasty  PORTABLE PELVIS  Comparison: 10/30/2011  Findings: Interval left total hip arthroplasty placement.  Stable acetabular side plate and screw fixation with bony overgrowth/osteophytosis again noted.  No fracture line or hardware complication visualized.  IVC filter partly visualized.  Normal visualized bowel gas pattern.  Femoral component incompletely imaged on this provided view.  Left hip skin staples and soft tissue gas noted.  IMPRESSION: Expected postoperative appearance after left total hip arthroplasty.   Original Report Authenticated By: Harrel Lemon, M.D.    Dg Hip Portable 1 View Left  03/19/2012  *RADIOLOGY REPORT*  Clinical Data: Postoperative radiograph.  Left total hip arthroplasty.  PORTABLE LEFT HIP - 1 VIEW  Comparison: 06/30/2011.  Findings: Malleable metal plate and  screw fixation of the left ilium is present.  There is a new left total hip arthroplasty which appears located on this single frontal view.  No periprosthetic fracture.  IMPRESSION: Uncomplicated new left total hip arthroplasty.   Original Report Authenticated By: Andreas Newport,  M.D.     Disposition: 06-Home-Health Care Svc  Discharge Orders    Future Appointments: Provider: Department: Dept Phone: Center:   05/14/2012 2:30 PM [facility] Pv 2 [facility] [phone] None   05/14/2012 4:15 PM Luis Abed, MD [facility] [phone] [facility]     Future Orders Please Complete By Expires   Diet - low sodium heart healthy      Call MD / Call 911      Comments:   If you experience chest pain or shortness of breath, CALL 911 and be transported to the hospital emergency room.  If you develope a fever above 101 F, pus (white drainage) or increased drainage or redness at the wound, or calf pain, call your surgeon's office.   Constipation Prevention      Comments:   Drink plenty of fluids.  Prune juice may be helpful.  You may use a stool softener, such as Colace (over the counter) 100 mg twice a day.  Use MiraLax (over the counter) for constipation as needed.   Increase activity slowly as tolerated      Discharge instructions      Comments:   Increase your activities as comfort allows. Wait 4-5 days before getting your incision wet in the shower.      Follow-up Information    Call Kathryne Hitch, MD.   Contact information:   [facility] [address] [place] [address] [phone]           Signed: Kathryne Hitch 03/21/2012, 7:12 AM

## 2012-03-21 NOTE — Care Management Note (Signed)
    Page 1 of 2   03/21/2012     9:35:59 AM   CARE MANAGEMENT NOTE 03/21/2012  Patient:  [name], [name]   Account Number:  [id number]  Date Initiated:  03/21/2012  Documentation initiated by:  Anette Guarneri  Subjective/Objective Assessment:   POD#2 s/p left [facility]  will need [facility] services  has all DME     Action/Plan:   home with [facility] services   Anticipated DC Date:  03/21/2012   Anticipated DC Plan:  HOME W HOME HEALTH SERVICES      DC Planning Services  CM consult      [facility] Choice  HOME HEALTH   Choice offered to / List presented to:  [name]        HH arranged  HH-2 PT      [facility] agency  [facility]   Status of service:  Completed, signed off Medicare Important Message given?  NO (If response is "NO", the following Medicare IM given date fields will be blank) Date Medicare IM given:   Date Additional Medicare IM given:    Discharge Disposition:  HOME W HOME HEALTH SERVICES  Per UR Regulation:  Reviewed for med. necessity/level of care/duration of stay  If discussed at Long Length of Stay Meetings, dates discussed:    Comments:  03/21/12  09:33 Anette Guarneri RN/CM HHPT/OT ordered, patient declines HHOT, per Ms. Rauh she has all DME Patient has used [facility] in past for [facility] services and wants to continue with [facility] after discharge. Contacted [facility], arranged for HHPT to begin after discharge home.

## 2012-03-21 NOTE — Progress Notes (Signed)
Subjective: 2 Days Post-Op Procedure(s) (LRB): TOTAL HIP ARTHROPLASTY ANTERIOR APPROACH (Left) Patient reports pain as mild.    Objective: Vital signs in last 24 hours: Temp:  [98.8 F (37.1 C)-99.6 F (37.6 C)] 99.6 F (37.6 C) (08/29 0655) Pulse Rate:  [69-90] 88  (08/29 0655) Resp:  [14-20] 18  (08/29 0655) BP: (99-120)/(51-68) 120/68 mmHg (08/29 0655) SpO2:  [97 %-99 %] 98 % (08/29 0655) FiO2 (%):  [97 %] 97 % (08/28 0802)  Intake/Output from previous day: 08/28 0701 - 08/29 0700 In: 720 [P.O.:720] Out: 600 [Urine:600] Intake/Output this shift:     Basename 03/20/12 0600  HGB 10.1*    Basename 03/20/12 0600  WBC 12.0*  RBC 3.26*  HCT 31.1*  PLT 211    Basename 03/20/12 0600  NA 138  K 4.3  CL 101  CO2 28  BUN 11  CREATININE 0.55  GLUCOSE 128*  CALCIUM 8.6   No results found for this basename: LABPT:2,INR:2 in the last 72 hours  Sensation intact distally Intact pulses distally Dorsiflexion/Plantar flexion intact Incision: scant drainage  Assessment/Plan: 2 Days Post-Op Procedure(s) (LRB): TOTAL HIP ARTHROPLASTY ANTERIOR APPROACH (Left) Up with therapy Discharge home with home health  Kathryne Hitch 03/21/2012, 7:09 AM

## 2012-03-21 NOTE — Progress Notes (Signed)
Physical Therapy Treatment Patient Details Name: [name] MRN: [id number] DOB: 04-08-1966 Today's Date: 03/21/2012 Time: 0922-0940 PT Time Calculation (min): 18 min  PT Assessment / Plan / Recommendation Comments on Treatment Session  Limited this session due to pain and mm spasms. Pt able to ambulate but needs extra time and cues for safety.  Pt plans to d/c home today and will have assistance needed at home.  Pt has no steps to enter house.  Attempted to review HEP however pt refused at stated "I know how to do all those exercises from my past surgeries."  Pt will have HHPT to follow up.    Follow Up Recommendations  Home health PT;Supervision/Assistance - 24 hour    Barriers to Discharge        Equipment Recommendations  None recommended by PT    Recommendations for Other Services    Frequency 7X/week   Plan Discharge plan remains appropriate;Frequency remains appropriate    Precautions / Restrictions Precautions Precautions: None Restrictions Weight Bearing Restrictions: Yes LLE Weight Bearing: Weight bearing as tolerated   Pertinent Vitals/Pain 8/10 left hip; pt premedicated     Mobility  Bed Mobility Bed Mobility: Supine to Sit;Sit to Supine Supine to Sit: 4: Min assist;HOB flat;With rails Sit to Supine: 4: Min assist;HOB flat;With rail Details for Bed Mobility Assistance: (A) to elevate left LE OOB with max cues for technique Transfers Transfers: Sit to Stand;Stand to Sit Sit to Stand: 4: Min guard;From bed;With armrests Stand to Sit: 4: Min guard;To bed Details for Transfer Assistance: Minguaurd for safety due to impulsive nature from pain. Ambulation/Gait Ambulation/Gait Assistance: 5: Supervision Ambulation Distance (Feet): 100 Feet Assistive device: Rolling walker Ambulation/Gait Assistance Details: Attempted to educate pt on proper step sequence however pt unable to advance Left LE prior to right LE.  Pt continued to stay in forward flex posture and  limited due to pain. Gait Pattern: Step-to pattern;Shuffle;Trunk flexed    Exercises     PT Diagnosis:    PT Problem List:   PT Treatment Interventions:     PT Goals Acute Rehab PT Goals PT Goal Formulation: With patient Time For Goal Achievement: 03/27/12 Potential to Achieve Goals: Good Pt will go Supine/Side to Sit: with modified independence PT Goal: Supine/Side to Sit - Progress: Progressing toward goal Pt will go Sit to Supine/Side: with modified independence PT Goal: Sit to Supine/Side - Progress: Progressing toward goal Pt will go Sit to Stand: with modified independence PT Goal: Sit to Stand - Progress: Progressing toward goal Pt will go Stand to Sit: with modified independence PT Goal: Stand to Sit - Progress: Progressing toward goal  Visit Information  Last PT Received On: 03/21/12 Assistance Needed: +1    Subjective Data  Subjective: "I'm having muscle spasms.   Cognition  Overall Cognitive Status: Appears within functional limits for tasks assessed/performed Arousal/Alertness: Awake/alert Orientation Level: Appears intact for tasks assessed Behavior During Session: [facility] for tasks performed    Balance     End of Session PT - End of Session Equipment Utilized During Treatment: Gait belt Activity Tolerance: Patient tolerated treatment well Patient left: in bed;with call bell/phone within reach;with family/visitor present Nurse Communication: Mobility status   GP     [name] 03/21/2012, 9:44 AM Jake Shark, PT DPT [phone]

## 2012-04-24 ENCOUNTER — Ambulatory Visit (HOSPITAL_COMMUNITY)
Admission: RE | Admit: 2012-04-24 | Discharge: 2012-04-24 | Disposition: A | Discharge status: Home / Self Care | Payer: 59 | Source: Ambulatory Visit | Attending: Orthopedic Surgery | Admitting: Orthopedic Surgery

## 2012-04-24 DIAGNOSIS — IMO0001 Reserved for inherently not codable concepts without codable children: Secondary | ICD-10-CM | POA: Insufficient documentation

## 2012-04-24 DIAGNOSIS — M25673 Stiffness of unspecified ankle, not elsewhere classified: Secondary | ICD-10-CM | POA: Insufficient documentation

## 2012-04-24 DIAGNOSIS — E119 Type 2 diabetes mellitus without complications: Secondary | ICD-10-CM | POA: Insufficient documentation

## 2012-04-24 DIAGNOSIS — R262 Difficulty in walking, not elsewhere classified: Secondary | ICD-10-CM | POA: Insufficient documentation

## 2012-04-24 DIAGNOSIS — M25579 Pain in unspecified ankle and joints of unspecified foot: Secondary | ICD-10-CM | POA: Insufficient documentation

## 2012-04-24 DIAGNOSIS — M6281 Muscle weakness (generalized): Secondary | ICD-10-CM | POA: Insufficient documentation

## 2012-04-24 DIAGNOSIS — M25676 Stiffness of unspecified foot, not elsewhere classified: Secondary | ICD-10-CM | POA: Insufficient documentation
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
## 2012-04-25 ENCOUNTER — Ambulatory Visit (HOSPITAL_COMMUNITY)
Admission: RE | Admit: 2012-04-25 | Discharge: 2012-04-25 | Payer: 59 | Source: Ambulatory Visit | Attending: Physical Therapy | Admitting: Physical Therapy
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
## 2012-04-30 ENCOUNTER — Ambulatory Visit (HOSPITAL_COMMUNITY)
Admission: RE | Admit: 2012-04-30 | Discharge: 2012-04-30 | Disposition: A | Discharge status: Home / Self Care | Payer: 59 | Source: Ambulatory Visit | Attending: Physical Medicine and Rehabilitation | Admitting: Physical Medicine and Rehabilitation

## 2012-04-30 ENCOUNTER — Ambulatory Visit (HOSPITAL_COMMUNITY): Payer: 59 | Admitting: *Deleted
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
## 2012-05-02 ENCOUNTER — Ambulatory Visit (HOSPITAL_COMMUNITY)
Admission: RE | Admit: 2012-05-02 | Discharge: 2012-05-02 | Disposition: A | Discharge status: Home / Self Care | Payer: 59 | Source: Ambulatory Visit | Attending: Physical Medicine and Rehabilitation | Admitting: Physical Medicine and Rehabilitation

## 2012-05-02 ENCOUNTER — Ambulatory Visit (HOSPITAL_COMMUNITY): Payer: 59 | Admitting: Physical Therapy
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
## 2012-05-06 ENCOUNTER — Ambulatory Visit (HOSPITAL_COMMUNITY)
Admission: RE | Admit: 2012-05-06 | Discharge: 2012-05-06 | Disposition: A | Discharge status: Home / Self Care | Payer: 59 | Source: Ambulatory Visit | Attending: Physical Therapy | Admitting: Physical Therapy
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
## 2012-05-08 ENCOUNTER — Ambulatory Visit (HOSPITAL_COMMUNITY): Payer: 59 | Admitting: Physical Therapy

## 2012-05-09 ENCOUNTER — Other Ambulatory Visit: Payer: Self-pay | Admitting: *Deleted

## 2012-05-09 DIAGNOSIS — I6529 Occlusion and stenosis of unspecified carotid artery: Secondary | ICD-10-CM

## 2012-05-10 ENCOUNTER — Ambulatory Visit (HOSPITAL_COMMUNITY)
Admission: RE | Admit: 2012-05-10 | Discharge: 2012-05-10 | Disposition: A | Discharge status: Home / Self Care | Payer: 59 | Source: Ambulatory Visit

## 2012-05-10 ENCOUNTER — Ambulatory Visit (HOSPITAL_COMMUNITY): Payer: 59
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
## 2012-05-13 ENCOUNTER — Ambulatory Visit (HOSPITAL_COMMUNITY): Payer: 59 | Admitting: Physical Therapy

## 2012-05-14 ENCOUNTER — Encounter (INDEPENDENT_AMBULATORY_CARE_PROVIDER_SITE_OTHER): Payer: 59

## 2012-05-14 ENCOUNTER — Ambulatory Visit (INDEPENDENT_AMBULATORY_CARE_PROVIDER_SITE_OTHER): Payer: 59 | Admitting: Cardiology

## 2012-05-14 ENCOUNTER — Encounter: Payer: Self-pay | Admitting: Cardiology

## 2012-05-14 VITALS — BP 130/80 | HR 82 | Wt 177.0 lb

## 2012-05-14 DIAGNOSIS — I779 Disorder of arteries and arterioles, unspecified: Secondary | ICD-10-CM

## 2012-05-14 DIAGNOSIS — Z95828 Presence of other vascular implants and grafts: Secondary | ICD-10-CM | POA: Insufficient documentation

## 2012-05-14 DIAGNOSIS — Z9889 Other specified postprocedural states: Secondary | ICD-10-CM

## 2012-05-14 DIAGNOSIS — I2510 Atherosclerotic heart disease of native coronary artery without angina pectoris: Secondary | ICD-10-CM

## 2012-05-14 DIAGNOSIS — I6529 Occlusion and stenosis of unspecified carotid artery: Secondary | ICD-10-CM
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
## 2012-05-14 NOTE — Assessment & Plan Note (Signed)
Her carotid disease is stable. No change in therapy.

## 2012-05-14 NOTE — Patient Instructions (Addendum)
Your physician wants you to follow-up in:  6 months. You will receive a reminder letter in the mail two months in advance. If you don't receive a letter, please call our office to schedule the follow-up appointment.   

## 2012-05-14 NOTE — Assessment & Plan Note (Signed)
Coronary disease is stable. No change in therapy. 
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
## 2012-05-15 ENCOUNTER — Ambulatory Visit (HOSPITAL_COMMUNITY): Payer: 59 | Admitting: Physical Therapy

## 2012-05-16 ENCOUNTER — Ambulatory Visit (HOSPITAL_COMMUNITY)
Admission: RE | Admit: 2012-05-16 | Discharge: 2012-05-16 | Disposition: A | Discharge status: Home / Self Care | Payer: 59 | Source: Ambulatory Visit
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
## 2012-05-17 ENCOUNTER — Ambulatory Visit (HOSPITAL_COMMUNITY): Payer: 59

## 2012-05-20 ENCOUNTER — Ambulatory Visit (HOSPITAL_COMMUNITY): Payer: 59 | Admitting: Physical Therapy

## 2012-05-21 ENCOUNTER — Ambulatory Visit (HOSPITAL_COMMUNITY)
Admission: RE | Admit: 2012-05-21 | Discharge: 2012-05-21 | Disposition: A | Discharge status: Home / Self Care | Payer: 59 | Source: Ambulatory Visit | Attending: Physical Therapy | Admitting: Physical Therapy
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
## 2012-05-22 ENCOUNTER — Ambulatory Visit (HOSPITAL_COMMUNITY): Payer: 59 | Admitting: Physical Therapy

## 2012-05-23 ENCOUNTER — Inpatient Hospital Stay (HOSPITAL_COMMUNITY)
Admission: RE | Admit: 2012-05-23 | Discharge: 2012-05-23 | Payer: 59 | Source: Ambulatory Visit | Attending: Physical Therapy | Admitting: Physical Therapy

## 2012-05-23 ENCOUNTER — Ambulatory Visit (HOSPITAL_COMMUNITY)
Admission: RE | Admit: 2012-05-23 | Discharge: 2012-05-23 | Disposition: A | Discharge status: Home / Self Care | Payer: 59 | Source: Ambulatory Visit | Attending: Orthopedic Surgery | Admitting: Orthopedic Surgery

## 2012-05-23 DIAGNOSIS — R262 Difficulty in walking, not elsewhere classified: Secondary | ICD-10-CM | POA: Insufficient documentation

## 2012-05-23 DIAGNOSIS — E119 Type 2 diabetes mellitus without complications: Secondary | ICD-10-CM | POA: Insufficient documentation

## 2012-05-23 DIAGNOSIS — M6281 Muscle weakness (generalized): Secondary | ICD-10-CM | POA: Insufficient documentation

## 2012-05-23 DIAGNOSIS — IMO0001 Reserved for inherently not codable concepts without codable children: Secondary | ICD-10-CM | POA: Insufficient documentation

## 2012-05-23 DIAGNOSIS — M25676 Stiffness of unspecified foot, not elsewhere classified: Secondary | ICD-10-CM | POA: Insufficient documentation

## 2012-05-23 DIAGNOSIS — M25673 Stiffness of unspecified ankle, not elsewhere classified: Secondary | ICD-10-CM | POA: Insufficient documentation

## 2012-05-23 DIAGNOSIS — M25579 Pain in unspecified ankle and joints of unspecified foot: Secondary | ICD-10-CM | POA: Insufficient documentation
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
## 2012-05-24 ENCOUNTER — Ambulatory Visit (HOSPITAL_COMMUNITY): Payer: 59

## 2012-05-28 ENCOUNTER — Inpatient Hospital Stay (HOSPITAL_COMMUNITY): Admission: RE | Admit: 2012-05-28 | Payer: 59 | Source: Ambulatory Visit | Admitting: Physical Therapy

## 2012-05-30 ENCOUNTER — Ambulatory Visit (HOSPITAL_COMMUNITY)
Admission: RE | Admit: 2012-05-30 | Discharge: 2012-05-30 | Disposition: A | Discharge status: Home / Self Care | Payer: 59 | Source: Ambulatory Visit | Attending: Orthopedic Surgery | Admitting: Orthopedic Surgery

## 2012-05-30 DIAGNOSIS — E119 Type 2 diabetes mellitus without complications: Secondary | ICD-10-CM | POA: Insufficient documentation

## 2012-05-30 DIAGNOSIS — R262 Difficulty in walking, not elsewhere classified: Secondary | ICD-10-CM | POA: Insufficient documentation

## 2012-05-30 DIAGNOSIS — M25579 Pain in unspecified ankle and joints of unspecified foot: Secondary | ICD-10-CM | POA: Insufficient documentation

## 2012-05-30 DIAGNOSIS — IMO0001 Reserved for inherently not codable concepts without codable children: Secondary | ICD-10-CM | POA: Insufficient documentation

## 2012-05-30 DIAGNOSIS — M6281 Muscle weakness (generalized): Secondary | ICD-10-CM | POA: Insufficient documentation

## 2012-05-30 DIAGNOSIS — M25676 Stiffness of unspecified foot, not elsewhere classified: Secondary | ICD-10-CM | POA: Insufficient documentation

## 2012-05-30 DIAGNOSIS — M25673 Stiffness of unspecified ankle, not elsewhere classified: Secondary | ICD-10-CM | POA: Insufficient documentation
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
## 2012-06-04 ENCOUNTER — Ambulatory Visit (HOSPITAL_COMMUNITY)
Admission: RE | Admit: 2012-06-04 | Discharge: 2012-06-04 | Disposition: A | Discharge status: Home / Self Care | Payer: 59 | Source: Ambulatory Visit
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
## 2012-06-06 ENCOUNTER — Ambulatory Visit (HOSPITAL_COMMUNITY): Payer: 59 | Admitting: *Deleted

## 2012-06-28 ENCOUNTER — Encounter (INDEPENDENT_AMBULATORY_CARE_PROVIDER_SITE_OTHER): Payer: Self-pay | Admitting: General Surgery

## 2012-06-28 ENCOUNTER — Ambulatory Visit (INDEPENDENT_AMBULATORY_CARE_PROVIDER_SITE_OTHER): Payer: 59 | Admitting: General Surgery

## 2012-06-28 VITALS — BP 113/60 | HR 80 | Temp 98.3°F | Resp 16 | Ht 65.0 in | Wt 181.2 lb

## 2012-06-28 DIAGNOSIS — L720 Epidermal cyst: Secondary | ICD-10-CM

## 2012-06-28 DIAGNOSIS — L723 Sebaceous cyst: Secondary | ICD-10-CM
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
## 2012-06-28 NOTE — Addendum Note (Signed)
Addended by: Axel Filler on: 06/28/2012 03:06 PM   Modules accepted: Orders

## 2012-07-02 ENCOUNTER — Other Ambulatory Visit (INDEPENDENT_AMBULATORY_CARE_PROVIDER_SITE_OTHER): Payer: Self-pay | Admitting: General Surgery

## 2012-09-16 ENCOUNTER — Telehealth: Payer: Self-pay | Admitting: Cardiology

## 2012-09-16 DIAGNOSIS — I779 Disorder of arteries and arterioles, unspecified: Secondary | ICD-10-CM

## 2012-09-16 NOTE — Telephone Encounter (Signed)
Will check with Dr Myrtis Ser to make sure pt needs a 6 month carotid doppler.

## 2012-09-16 NOTE — Telephone Encounter (Signed)
New problem   Patient calling wanted to set up an doppler test prior to her appt with Dr. Myrtis Ser in April

## 2012-09-25 NOTE — Telephone Encounter (Signed)
Carotid doppler ordered.  Pt was notified.  Staff message sent to [facility] pool to schedule.

## 2012-09-25 NOTE — Telephone Encounter (Signed)
The patient should have a 6 month followup Doppler. Please arrange for her to have this the day that she comes to see me in April with a Doppler done before her visit with me that day.

## 2012-10-02 ENCOUNTER — Other Ambulatory Visit: Payer: Self-pay

## 2012-10-02 MED ORDER — CLOPIDOGREL BISULFATE 75 MG PO TABS: 75.0000 mg | ORAL_TABLET | Freq: Every day | ORAL | Status: DC

## 2012-11-11 ENCOUNTER — Encounter: Payer: Self-pay | Admitting: Cardiology

## 2012-11-11 ENCOUNTER — Encounter (INDEPENDENT_AMBULATORY_CARE_PROVIDER_SITE_OTHER): Payer: 59

## 2012-11-11 ENCOUNTER — Ambulatory Visit (INDEPENDENT_AMBULATORY_CARE_PROVIDER_SITE_OTHER): Payer: 59 | Admitting: Cardiology

## 2012-11-11 VITALS — BP 110/68 | HR 78 | Ht 65.0 in | Wt 196.1 lb

## 2012-11-11 DIAGNOSIS — L0591 Pilonidal cyst without abscess: Secondary | ICD-10-CM

## 2012-11-11 DIAGNOSIS — D239 Other benign neoplasm of skin, unspecified: Secondary | ICD-10-CM

## 2012-11-11 DIAGNOSIS — I25810 Atherosclerosis of coronary artery bypass graft(s) without angina pectoris: Secondary | ICD-10-CM

## 2012-11-11 DIAGNOSIS — I2510 Atherosclerotic heart disease of native coronary artery without angina pectoris: Secondary | ICD-10-CM

## 2012-11-11 DIAGNOSIS — D229 Melanocytic nevi, unspecified: Secondary | ICD-10-CM

## 2012-11-11 DIAGNOSIS — Z95828 Presence of other vascular implants and grafts: Secondary | ICD-10-CM

## 2012-11-11 DIAGNOSIS — I779 Disorder of arteries and arterioles, unspecified: Secondary | ICD-10-CM

## 2012-11-11 DIAGNOSIS — I6529 Occlusion and stenosis of unspecified carotid artery: Secondary | ICD-10-CM

## 2012-11-11 DIAGNOSIS — Z9889 Other specified postprocedural states: Secondary | ICD-10-CM

## 2012-11-11 NOTE — Assessment & Plan Note (Signed)
Coronary disease is stable. No change in therapy. 

## 2012-11-11 NOTE — Assessment & Plan Note (Signed)
I am pleased that her carotid Doppler today is again stable. Now it appears we can move her checks back to yearly.
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
## 2012-11-11 NOTE — Assessment & Plan Note (Signed)
The cyst drains intermittently but she has this under control. At a later date she hopes to have this removed when her overall situation is more stable at home concerning all of her medical problems.
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
## 2012-11-11 NOTE — Assessment & Plan Note (Signed)
The patient is looking into having this removed on elective basis at a later time.

## 2012-11-11 NOTE — Patient Instructions (Signed)
Your physician wants you to follow-up in: 6 MONTHS WITH DR KATZ  You will receive a reminder letter in the mail two months in advance. If you don't receive a letter, please call our office to schedule the follow-up appointment. Your physician recommends that you continue on your current medications as directed. Please refer to the Current Medication list given to you today. 

## 2012-12-27 ENCOUNTER — Other Ambulatory Visit: Payer: Self-pay

## 2012-12-27 MED ORDER — BENAZEPRIL HCL 5 MG PO TABS: 5.0000 mg | ORAL_TABLET | Freq: Every day | ORAL | Status: DC

## 2012-12-27 MED ORDER — EZETIMIBE-SIMVASTATIN 10-40 MG PO TABS: 1.0000 | ORAL_TABLET | Freq: Every day | ORAL | Status: DC

## 2012-12-27 NOTE — Telephone Encounter (Signed)
ezetimibe-simvastatin (VYTORIN) 10-40 MG per tablet  Take 1 tablet by mouth at bedtime.   Your physician recommends that you continue on your current medications as directed. Please refer to the Current Medication list given to you today. Luis Abed, MD at 11/11/2012 10:43 AM

## 2012-12-27 NOTE — Telephone Encounter (Signed)
benazepril (LOTENSIN) 5 MG tablet  Take 1 tablet (5 mg total) by mouth daily.   90 tablet   Your physician recommends that you continue on your current medications as directed. Please refer to the Current Medication list given to you today. Luis Abed, MD at 11/11/2012 10:43 AM

## 2013-01-01 ENCOUNTER — Other Ambulatory Visit: Payer: Self-pay

## 2013-01-01 MED ORDER — METOPROLOL TARTRATE 25 MG PO TABS: 25.0000 mg | ORAL_TABLET | Freq: Two times a day (BID) | ORAL | Status: DC

## 2013-01-01 NOTE — Telephone Encounter (Signed)
Luis Abed, MD at 11/11/2012 10:43 AM metoprolol tartrate (LOPRESSOR) 25 MG tablet  Take 1 tablet (25 mg total) by mouth 2 (two) times daily.   180 tablet   2   Your physician recommends that you continue on your current medications as directed. Please refer to the Current Medication list given to you today.

## 2013-01-09 ENCOUNTER — Other Ambulatory Visit: Payer: Self-pay | Admitting: Obstetrics and Gynecology

## 2013-01-09 DIAGNOSIS — R921 Mammographic calcification found on diagnostic imaging of breast: Secondary | ICD-10-CM
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
## 2013-02-18 ENCOUNTER — Ambulatory Visit
Admission: RE | Admit: 2013-02-18 | Discharge: 2013-02-18 | Disposition: A | Discharge status: Home / Self Care | Payer: 59 | Source: Ambulatory Visit | Attending: Obstetrics and Gynecology | Admitting: Obstetrics and Gynecology

## 2013-02-18 DIAGNOSIS — R921 Mammographic calcification found on diagnostic imaging of breast: Secondary | ICD-10-CM

## 2013-03-30 ENCOUNTER — Other Ambulatory Visit: Payer: Self-pay | Admitting: Cardiology

## 2013-05-12 ENCOUNTER — Ambulatory Visit: Payer: 59 | Admitting: Cardiology

## 2013-05-27 ENCOUNTER — Telehealth: Payer: Self-pay

## 2013-05-27 ENCOUNTER — Encounter: Payer: Self-pay | Admitting: Cardiology

## 2013-05-27 ENCOUNTER — Ambulatory Visit (INDEPENDENT_AMBULATORY_CARE_PROVIDER_SITE_OTHER): Payer: 59 | Admitting: Cardiology

## 2013-05-27 VITALS — BP 146/82 | HR 81 | Ht 65.0 in | Wt 195.0 lb

## 2013-05-27 DIAGNOSIS — Z720 Tobacco use: Secondary | ICD-10-CM

## 2013-05-27 DIAGNOSIS — E785 Hyperlipidemia, unspecified: Secondary | ICD-10-CM

## 2013-05-27 DIAGNOSIS — Z95828 Presence of other vascular implants and grafts: Secondary | ICD-10-CM

## 2013-05-27 DIAGNOSIS — I779 Disorder of arteries and arterioles, unspecified: Secondary | ICD-10-CM

## 2013-05-27 DIAGNOSIS — F17200 Nicotine dependence, unspecified, uncomplicated: Secondary | ICD-10-CM

## 2013-05-27 DIAGNOSIS — I2510 Atherosclerotic heart disease of native coronary artery without angina pectoris: Secondary | ICD-10-CM

## 2013-05-27 DIAGNOSIS — Z9889 Other specified postprocedural states: Secondary | ICD-10-CM

## 2013-05-27 MED ORDER — NITROGLYCERIN 0.4 MG SL SUBL: 0.4000 mg | SUBLINGUAL_TABLET | SUBLINGUAL | Status: DC | PRN

## 2013-05-27 MED ORDER — ATORVASTATIN CALCIUM 80 MG PO TABS: 80.0000 mg | ORAL_TABLET | Freq: Every day | ORAL | Status: DC

## 2013-05-27 NOTE — Patient Instructions (Addendum)
Your physician has recommended you make the following change in your medication: stop taking Vytorin and start taking Lipitor 80 mg at bedtime.  Your physician wants you to follow-up in: 6 months. You will receive a reminder letter in the mail two months in advance. If you don't receive a letter, please call our office to schedule the follow-up appointment.

## 2013-05-27 NOTE — Assessment & Plan Note (Signed)
Carotid artery disease is stable. No change in therapy.

## 2013-05-27 NOTE — Assessment & Plan Note (Signed)
Coronary disease is stable. No change in therapy. 

## 2013-05-27 NOTE — Addendum Note (Signed)
Addended by: Demetrios Loll on: 05/27/2013 06:08 PM   Modules accepted: Orders, Medications

## 2013-05-27 NOTE — Assessment & Plan Note (Signed)
I have reviewed with the patient that it is recommended that she not have her IVC filter removed. This is my recommendation after careful review with radiology.

## 2013-05-27 NOTE — Assessment & Plan Note (Signed)
She'll we says that she hopes to stop smoking but she is not able to. She does limited. Her intention is to stop.

## 2013-05-27 NOTE — Assessment & Plan Note (Signed)
I felt to talk with the patient about her cholesterol medications while she was here today. We will recontact her. I want her to switch to 80 mg of Lipitor if she has not had a problem with this med before.
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
## 2013-07-02 ENCOUNTER — Other Ambulatory Visit: Payer: Self-pay | Admitting: Cardiology

## 2013-09-16 ENCOUNTER — Other Ambulatory Visit (HOSPITAL_COMMUNITY): Payer: Self-pay | Admitting: Internal Medicine

## 2013-09-16 DIAGNOSIS — G8929 Other chronic pain: Secondary | ICD-10-CM

## 2013-09-22 ENCOUNTER — Ambulatory Visit (HOSPITAL_COMMUNITY)
Admission: RE | Admit: 2013-09-22 | Discharge: 2013-09-22 | Disposition: A | Discharge status: Home / Self Care | Payer: 59 | Source: Ambulatory Visit | Attending: Internal Medicine | Admitting: Internal Medicine

## 2013-09-22 DIAGNOSIS — K7689 Other specified diseases of liver: Secondary | ICD-10-CM | POA: Insufficient documentation

## 2013-09-22 DIAGNOSIS — K824 Cholesterolosis of gallbladder: Secondary | ICD-10-CM | POA: Insufficient documentation

## 2013-09-22 DIAGNOSIS — G8929 Other chronic pain: Secondary | ICD-10-CM

## 2013-10-03 ENCOUNTER — Other Ambulatory Visit: Payer: Self-pay | Admitting: Cardiology

## 2013-12-25 ENCOUNTER — Other Ambulatory Visit: Payer: Self-pay | Admitting: Cardiology

## 2014-02-25 ENCOUNTER — Other Ambulatory Visit (HOSPITAL_COMMUNITY): Payer: Self-pay | Admitting: *Deleted

## 2014-02-25 DIAGNOSIS — I6529 Occlusion and stenosis of unspecified carotid artery: Secondary | ICD-10-CM

## 2014-03-12 ENCOUNTER — Ambulatory Visit: Payer: 59 | Admitting: Cardiology

## 2014-03-20 ENCOUNTER — Ambulatory Visit (HOSPITAL_COMMUNITY): Payer: 59 | Attending: Cardiology | Admitting: Cardiology

## 2014-03-20 ENCOUNTER — Encounter: Payer: Self-pay | Admitting: Cardiology

## 2014-03-20 ENCOUNTER — Ambulatory Visit (INDEPENDENT_AMBULATORY_CARE_PROVIDER_SITE_OTHER): Payer: 59 | Admitting: Cardiology

## 2014-03-20 VITALS — BP 150/78 | HR 68 | Ht 67.0 in | Wt 181.0 lb

## 2014-03-20 DIAGNOSIS — E119 Type 2 diabetes mellitus without complications: Secondary | ICD-10-CM | POA: Insufficient documentation

## 2014-03-20 DIAGNOSIS — I739 Peripheral vascular disease, unspecified: Secondary | ICD-10-CM

## 2014-03-20 DIAGNOSIS — I10 Essential (primary) hypertension: Secondary | ICD-10-CM | POA: Insufficient documentation

## 2014-03-20 DIAGNOSIS — E785 Hyperlipidemia, unspecified: Secondary | ICD-10-CM

## 2014-03-20 DIAGNOSIS — F17200 Nicotine dependence, unspecified, uncomplicated: Secondary | ICD-10-CM | POA: Diagnosis not present

## 2014-03-20 DIAGNOSIS — I2510 Atherosclerotic heart disease of native coronary artery without angina pectoris: Secondary | ICD-10-CM

## 2014-03-20 DIAGNOSIS — I6529 Occlusion and stenosis of unspecified carotid artery: Secondary | ICD-10-CM | POA: Diagnosis not present

## 2014-03-20 DIAGNOSIS — I779 Disorder of arteries and arterioles, unspecified: Secondary | ICD-10-CM

## 2014-03-20 NOTE — Progress Notes (Signed)
Carotid duplex performed 

## 2014-03-20 NOTE — Assessment & Plan Note (Signed)
There is slight elevation of her blood pressure today. Will arrange for her to obtain a home blood pressure cuff and monitor her blood pressure. She will call us if her pressures over 140/85
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
## 2014-03-20 NOTE — Assessment & Plan Note (Signed)
She has severe carotid disease but it is stable by Doppler today. We can follow in one year.

## 2014-03-20 NOTE — Assessment & Plan Note (Signed)
She successfully changed to atorvastatin 80 mg daily. No change in therapy.

## 2014-03-20 NOTE — Assessment & Plan Note (Signed)
Coronary disease is stable.  No further workup. 

## 2014-03-20 NOTE — Patient Instructions (Signed)
**Note De-Identified Clella Mckeel Obfuscation** Your physician recommends that you continue on your current medications as directed. Please refer to the Current Medication list given to you today.  Your physician wants you to follow-up in: 6 months. You will receive a reminder letter in the mail two months in advance. If you don't receive a letter, please call our office to schedule the follow-up appointment.  Please monitor your blood pressure at home daily and call us if your blood pressure goes above 140/85.

## 2014-03-27 ENCOUNTER — Other Ambulatory Visit: Payer: Self-pay | Admitting: *Deleted

## 2014-03-27 MED ORDER — METOPROLOL TARTRATE 25 MG PO TABS: ORAL_TABLET | ORAL | Status: DC

## 2014-03-27 MED ORDER — CLOPIDOGREL BISULFATE 75 MG PO TABS: ORAL_TABLET | ORAL | Status: DC

## 2014-03-27 MED ORDER — BENAZEPRIL HCL 5 MG PO TABS: ORAL_TABLET | ORAL | Status: DC

## 2014-03-27 MED ORDER — ATORVASTATIN CALCIUM 80 MG PO TABS: ORAL_TABLET | ORAL | Status: DC

## 2014-04-01 ENCOUNTER — Other Ambulatory Visit: Payer: Self-pay | Admitting: Obstetrics and Gynecology

## 2014-04-03 LAB — CYTOLOGY - PAP

## 2014-10-02 ENCOUNTER — Telehealth: Payer: Self-pay | Admitting: Cardiology

## 2014-10-02 NOTE — Telephone Encounter (Signed)
New Message        Pt calling stating that she was never called back to reschedule an appt w/ Dr. Ron Parker that was originally scheduled incorrectly as a bp check. Pt was offered Dr. Ron Parker first available appt in May and pt refused appt stating that it is too far away and she needs to be seen sooner. Pt states that she would like a call back from Dr. Ron Parker nurse so she can be worked in on his schedule. Please call back and advise.

## 2014-10-02 NOTE — Telephone Encounter (Signed)
The pt has been added to Dr Ron Parker schedule on 10/13/16 at 8:15. She agrees with the date and time of appt.

## 2014-10-06 ENCOUNTER — Telehealth: Payer: Self-pay | Admitting: Cardiology

## 2014-10-06 ENCOUNTER — Other Ambulatory Visit: Payer: Self-pay | Admitting: Cardiology

## 2014-10-06 NOTE — Telephone Encounter (Signed)
New Message        Pt calling stating that due to the mix up in her appt scheduling last month pt's medications need to be renewed. Please be on the look out for a fax we will be receiving from [place]. Please call back and advise.

## 2014-10-09 ENCOUNTER — Ambulatory Visit: Payer: Self-pay | Admitting: Cardiology

## 2014-10-14 ENCOUNTER — Encounter: Payer: Self-pay | Admitting: Cardiology

## 2014-10-14 ENCOUNTER — Ambulatory Visit (INDEPENDENT_AMBULATORY_CARE_PROVIDER_SITE_OTHER): Payer: 59 | Admitting: Cardiology

## 2014-10-14 VITALS — BP 104/64 | HR 73 | Ht 67.0 in | Wt 180.4 lb

## 2014-10-14 DIAGNOSIS — I779 Disorder of arteries and arterioles, unspecified: Secondary | ICD-10-CM | POA: Diagnosis not present

## 2014-10-14 DIAGNOSIS — I2510 Atherosclerotic heart disease of native coronary artery without angina pectoris: Secondary | ICD-10-CM | POA: Diagnosis not present

## 2014-10-14 DIAGNOSIS — I10 Essential (primary) hypertension: Secondary | ICD-10-CM | POA: Diagnosis not present

## 2014-10-14 DIAGNOSIS — Z720 Tobacco use: Secondary | ICD-10-CM

## 2014-10-14 DIAGNOSIS — F419 Anxiety disorder, unspecified: Secondary | ICD-10-CM

## 2014-10-14 DIAGNOSIS — I739 Peripheral vascular disease, unspecified: Secondary | ICD-10-CM

## 2014-10-14 NOTE — Assessment & Plan Note (Signed)
Coronary disease is stable. No further workup at this time. 

## 2014-10-14 NOTE — Patient Instructions (Signed)
**Note De-identified Katianna Mcclenney Obfuscation** Your physician recommends that you continue on your current medications as directed. Please refer to the Current Medication list given to you today.  Your physician wants you to follow-up in: 6 months. You will receive a reminder letter in the mail two months in advance. If you don't receive a letter, please call our office to schedule the follow-up appointment.  

## 2014-10-14 NOTE — Assessment & Plan Note (Signed)
We discussed her smoking carefully. I'm hoping that she will cut down further in the near future.

## 2014-10-14 NOTE — Assessment & Plan Note (Signed)
She is bothered by anxiety at times. She is functional and continues to work. She has many stresses. No change in therapy.

## 2014-10-14 NOTE — Assessment & Plan Note (Signed)
Blood pressures controlled. No change in therapy. 
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
## 2014-10-14 NOTE — Assessment & Plan Note (Signed)
She has significant carotid disease. However her carotids have been stable.

## 2014-11-05 ENCOUNTER — Inpatient Hospital Stay (HOSPITAL_COMMUNITY)
Admission: EM | Admit: 2014-11-05 | Discharge: 2014-11-09 | DRG: 600 | Disposition: A | Discharge status: Home / Self Care | Payer: 59 | Attending: Internal Medicine | Admitting: Internal Medicine

## 2014-11-05 ENCOUNTER — Encounter (HOSPITAL_COMMUNITY): Payer: Self-pay | Admitting: *Deleted

## 2014-11-05 DIAGNOSIS — M1990 Unspecified osteoarthritis, unspecified site: Secondary | ICD-10-CM | POA: Diagnosis present

## 2014-11-05 DIAGNOSIS — Z955 Presence of coronary angioplasty implant and graft: Secondary | ICD-10-CM | POA: Diagnosis not present

## 2014-11-05 DIAGNOSIS — Z8249 Family history of ischemic heart disease and other diseases of the circulatory system: Secondary | ICD-10-CM | POA: Diagnosis not present

## 2014-11-05 DIAGNOSIS — I2510 Atherosclerotic heart disease of native coronary artery without angina pectoris: Secondary | ICD-10-CM | POA: Diagnosis present

## 2014-11-05 DIAGNOSIS — N644 Mastodynia: Secondary | ICD-10-CM | POA: Diagnosis not present

## 2014-11-05 DIAGNOSIS — E785 Hyperlipidemia, unspecified: Secondary | ICD-10-CM | POA: Diagnosis present

## 2014-11-05 DIAGNOSIS — F419 Anxiety disorder, unspecified: Secondary | ICD-10-CM | POA: Diagnosis present

## 2014-11-05 DIAGNOSIS — Z96642 Presence of left artificial hip joint: Secondary | ICD-10-CM | POA: Diagnosis present

## 2014-11-05 DIAGNOSIS — F17210 Nicotine dependence, cigarettes, uncomplicated: Secondary | ICD-10-CM | POA: Diagnosis present

## 2014-11-05 DIAGNOSIS — Z7982 Long term (current) use of aspirin: Secondary | ICD-10-CM | POA: Diagnosis not present

## 2014-11-05 DIAGNOSIS — N61 Inflammatory disorders of breast: Secondary | ICD-10-CM | POA: Diagnosis present

## 2014-11-05 DIAGNOSIS — E119 Type 2 diabetes mellitus without complications: Secondary | ICD-10-CM

## 2014-11-05 DIAGNOSIS — R059 Cough, unspecified: Secondary | ICD-10-CM

## 2014-11-05 DIAGNOSIS — I10 Essential (primary) hypertension: Secondary | ICD-10-CM | POA: Diagnosis present

## 2014-11-05 DIAGNOSIS — Z95828 Presence of other vascular implants and grafts: Secondary | ICD-10-CM

## 2014-11-05 DIAGNOSIS — Z96652 Presence of left artificial knee joint: Secondary | ICD-10-CM | POA: Diagnosis present

## 2014-11-05 DIAGNOSIS — L0291 Cutaneous abscess, unspecified: Secondary | ICD-10-CM

## 2014-11-05 DIAGNOSIS — I252 Old myocardial infarction: Secondary | ICD-10-CM

## 2014-11-05 DIAGNOSIS — Z79891 Long term (current) use of opiate analgesic: Secondary | ICD-10-CM | POA: Diagnosis not present

## 2014-11-05 DIAGNOSIS — L03313 Cellulitis of chest wall: Secondary | ICD-10-CM | POA: Diagnosis present

## 2014-11-05 DIAGNOSIS — Z90710 Acquired absence of both cervix and uterus: Secondary | ICD-10-CM | POA: Diagnosis not present

## 2014-11-05 DIAGNOSIS — Z7902 Long term (current) use of antithrombotics/antiplatelets: Secondary | ICD-10-CM

## 2014-11-05 DIAGNOSIS — Z791 Long term (current) use of non-steroidal anti-inflammatories (NSAID): Secondary | ICD-10-CM | POA: Diagnosis not present

## 2014-11-05 DIAGNOSIS — L0390 Cellulitis, unspecified: Secondary | ICD-10-CM

## 2014-11-05 DIAGNOSIS — R05 Cough: Secondary | ICD-10-CM

## 2014-11-05 DIAGNOSIS — D72829 Elevated white blood cell count, unspecified: Secondary | ICD-10-CM

## 2014-11-05 DIAGNOSIS — N611 Abscess of the breast and nipple: Secondary | ICD-10-CM | POA: Diagnosis present

## 2014-11-05 LAB — BASIC METABOLIC PANEL
Anion gap: 10 (ref 5–15)
BUN: 7 mg/dL (ref 6–23)
CALCIUM: 8.9 mg/dL (ref 8.4–10.5)
CO2: 27 mmol/L (ref 19–32)
Chloride: 102 mmol/L (ref 96–112)
Creatinine, Ser: 0.6 mg/dL (ref 0.50–1.10)
GFR calc Af Amer: >90 mL/min (ref >90)
GLUCOSE: 113 mg/dL — AB (ref 70–99)
Potassium: 3.7 mmol/L (ref 3.5–5.1)
SODIUM: 139 mmol/L (ref 135–145)

## 2014-11-05 LAB — CBC WITH DIFFERENTIAL/PLATELET
BASOS ABS: 0 10*3/uL (ref 0.0–0.1)
Basophils Relative: 0 % (ref 0–1)
EOS ABS: 0.1 10*3/uL (ref 0.0–0.7)
Eosinophils Relative: 1 % (ref 0–5)
HCT: 38.3 % (ref 36.0–46.0)
Hemoglobin: 12.3 g/dL (ref 12.0–15.0)
Lymphocytes Relative: 19 % (ref 12–46)
Lymphs Abs: 3.2 10*3/uL (ref 0.7–4.0)
MCH: 29.7 pg (ref 26.0–34.0)
MCHC: 32.1 g/dL (ref 30.0–36.0)
MCV: 92.5 fL (ref 78.0–100.0)
MONO ABS: 1.3 10*3/uL — AB (ref 0.1–1.0)
MONOS PCT: 7 % (ref 3–12)
NEUTROS PCT: 73 % (ref 43–77)
Neutro Abs: 12.4 10*3/uL — ABNORMAL HIGH (ref 1.7–7.7)
Platelets: 305 10*3/uL (ref 150–400)
RBC: 4.14 MIL/uL (ref 3.87–5.11)
RDW: 14.4 % (ref 11.5–15.5)
WBC: 17 10*3/uL — ABNORMAL HIGH (ref 4.0–10.5)

## 2014-11-05 LAB — CBG MONITORING, ED: Glucose-Capillary: 101 mg/dL — ABNORMAL HIGH (ref 70–99)

## 2014-11-05 MED ORDER — VANCOMYCIN HCL IN DEXTROSE 1-5 GM/200ML-% IV SOLN
1000.0000 mg | Freq: Once | INTRAVENOUS | Status: AC
  Administered 2014-11-05: 1000 mg via INTRAVENOUS
  Filled 2014-11-05: qty 200

## 2014-11-05 MED ORDER — OXYCODONE-ACETAMINOPHEN 10-325 MG PO TABS: 1.0000 | ORAL_TABLET | ORAL | Status: DC | PRN

## 2014-11-05 MED ORDER — METOPROLOL TARTRATE 25 MG PO TABS
25.0000 mg | ORAL_TABLET | Freq: Two times a day (BID) | ORAL | Status: DC
  Administered 2014-11-05 – 2014-11-07 (×4): 25 mg via ORAL
  Filled 2014-11-05 (×5): qty 1

## 2014-11-05 MED ORDER — OMEGA-3-ACID ETHYL ESTERS 1 G PO CAPS
3.0000 g | ORAL_CAPSULE | Freq: Every day | ORAL | Status: DC
  Administered 2014-11-06 – 2014-11-09 (×3): 3 g via ORAL
  Filled 2014-11-05 (×4): qty 3

## 2014-11-05 MED ORDER — VANCOMYCIN HCL IN DEXTROSE 1-5 GM/200ML-% IV SOLN
1000.0000 mg | Freq: Two times a day (BID) | INTRAVENOUS | Status: DC
Start: 2014-11-06 — End: 2014-11-09
  Administered 2014-11-06 – 2014-11-09 (×7): 1000 mg via INTRAVENOUS
  Filled 2014-11-05 (×9): qty 200

## 2014-11-05 MED ORDER — NITROGLYCERIN 0.4 MG SL SUBL: 0.4000 mg | SUBLINGUAL_TABLET | SUBLINGUAL | Status: DC | PRN

## 2014-11-05 MED ORDER — INSULIN ASPART 100 UNIT/ML ~~LOC~~ SOLN: 0.0000 [IU] | Freq: Three times a day (TID) | SUBCUTANEOUS | Status: DC

## 2014-11-05 MED ORDER — OXYCODONE HCL 5 MG PO TABS
5.0000 mg | ORAL_TABLET | ORAL | Status: DC | PRN
  Administered 2014-11-05 – 2014-11-09 (×10): 5 mg via ORAL
  Filled 2014-11-05 (×10): qty 1

## 2014-11-05 MED ORDER — CLOPIDOGREL BISULFATE 75 MG PO TABS
75.0000 mg | ORAL_TABLET | Freq: Every day | ORAL | Status: DC
  Administered 2014-11-06 – 2014-11-07 (×2): 75 mg via ORAL
  Filled 2014-11-05 (×2): qty 1

## 2014-11-05 MED ORDER — METFORMIN HCL 500 MG PO TABS
1000.0000 mg | ORAL_TABLET | Freq: Two times a day (BID) | ORAL | Status: DC
  Filled 2014-11-05: qty 2

## 2014-11-05 MED ORDER — ONDANSETRON HCL 4 MG/2ML IJ SOLN
4.0000 mg | Freq: Once | INTRAMUSCULAR | Status: AC
  Administered 2014-11-05: 4 mg via INTRAVENOUS
  Filled 2014-11-05: qty 2

## 2014-11-05 MED ORDER — BUPROPION HCL ER (XL) 150 MG PO TB24
150.0000 mg | ORAL_TABLET | Freq: Every day | ORAL | Status: DC
  Filled 2014-11-05 (×6): qty 1

## 2014-11-05 MED ORDER — BENAZEPRIL HCL 10 MG PO TABS
5.0000 mg | ORAL_TABLET | Freq: Every day | ORAL | Status: DC
  Administered 2014-11-06 – 2014-11-09 (×3): 5 mg via ORAL
  Filled 2014-11-05 (×4): qty 1

## 2014-11-05 MED ORDER — HEPARIN SODIUM (PORCINE) 5000 UNIT/ML IJ SOLN: 5000.0000 [IU] | Freq: Three times a day (TID) | INTRAMUSCULAR | Status: DC

## 2014-11-05 MED ORDER — INSULIN ASPART 100 UNIT/ML ~~LOC~~ SOLN: 0.0000 [IU] | Freq: Every day | SUBCUTANEOUS | Status: DC

## 2014-11-05 MED ORDER — LINAGLIPTIN 5 MG PO TABS
5.0000 mg | ORAL_TABLET | Freq: Every day | ORAL | Status: DC
  Filled 2014-11-05: qty 1

## 2014-11-05 MED ORDER — ATORVASTATIN CALCIUM 40 MG PO TABS
80.0000 mg | ORAL_TABLET | Freq: Every day | ORAL | Status: DC
  Administered 2014-11-05 – 2014-11-08 (×4): 80 mg via ORAL
  Filled 2014-11-05 (×4): qty 2

## 2014-11-05 MED ORDER — MIRTAZAPINE 15 MG PO TABS
15.0000 mg | ORAL_TABLET | Freq: Every day | ORAL | Status: DC
  Administered 2014-11-05 – 2014-11-08 (×4): 15 mg via ORAL
  Filled 2014-11-05 (×4): qty 1

## 2014-11-05 MED ORDER — MORPHINE SULFATE 4 MG/ML IJ SOLN
4.0000 mg | Freq: Once | INTRAMUSCULAR | Status: AC
  Administered 2014-11-05: 4 mg via INTRAVENOUS
  Filled 2014-11-05: qty 1

## 2014-11-05 MED ORDER — ONDANSETRON HCL 4 MG PO TABS: 4.0000 mg | ORAL_TABLET | Freq: Four times a day (QID) | ORAL | Status: DC | PRN

## 2014-11-05 MED ORDER — ONDANSETRON HCL 4 MG/2ML IJ SOLN: 4.0000 mg | Freq: Four times a day (QID) | INTRAMUSCULAR | Status: DC | PRN

## 2014-11-05 MED ORDER — ALPRAZOLAM 0.5 MG PO TABS
0.5000 mg | ORAL_TABLET | Freq: Two times a day (BID) | ORAL | Status: DC | PRN
  Administered 2014-11-05 – 2014-11-08 (×7): 1 mg via ORAL
  Filled 2014-11-05 (×9): qty 2

## 2014-11-05 MED ORDER — SITAGLIPTIN PHOS-METFORMIN HCL 50-1000 MG PO TABS: 1.0000 | ORAL_TABLET | Freq: Two times a day (BID) | ORAL | Status: DC

## 2014-11-05 MED ORDER — OXYCODONE-ACETAMINOPHEN 5-325 MG PO TABS
1.0000 | ORAL_TABLET | ORAL | Status: DC | PRN
  Administered 2014-11-05 – 2014-11-09 (×12): 1 via ORAL
  Filled 2014-11-05 (×12): qty 1

## 2014-11-05 MED ORDER — ONDANSETRON HCL 4 MG/2ML IJ SOLN: 4.0000 mg | Freq: Once | INTRAMUSCULAR | Status: DC

## 2014-11-05 MED ORDER — ASPIRIN EC 81 MG PO TBEC
162.0000 mg | DELAYED_RELEASE_TABLET | Freq: Every day | ORAL | Status: DC
  Administered 2014-11-06 – 2014-11-07 (×2): 162 mg via ORAL
  Filled 2014-11-05 (×2): qty 2

## 2014-11-05 NOTE — ED Notes (Signed)
Pt states that she needs to go to her car to get some belongings out of it, offered to have security go to her car for her, pt refused,
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
## 2014-11-05 NOTE — ED Notes (Addendum)
Report given to floor,  
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
## 2014-11-05 NOTE — ED Notes (Signed)
Pt says she has a cyst on her chest, Sent by Dr Gerarda Fraction for evaluation

## 2014-11-05 NOTE — Progress Notes (Signed)
ANTIBIOTIC CONSULT NOTE  Pharmacy Consult for Vancomycin  Indication: cellulitis  Allergies  Allergen Reactions  . Doxycycline Nausea And Vomiting  . Penicillins Other (See Comments)    Unknown as a child.    Patient Measurements: Height: 5\' 6"  (167.6 cm) Weight: 174 lb (78.926 kg) IBW/kg (Calculated) : 59.3  Vital Signs: Temp: 99 F (37.2 C) (04/14 2027) Temp Source: Oral (04/14 2027) BP: 128/65 mmHg (04/14 2027) Pulse Rate: 111 (04/14 2027) Intake/Output from previous day:   Intake/Output from this shift:    Labs:  Recent Labs  11/05/14 2011  WBC 17.0*  HGB 12.3  PLT 305  CREATININE 0.60   Estimated Creatinine Clearance: 93.1 mL/min (by C-G formula based on Cr of 0.6). No results for input(s): VANCOTROUGH, VANCOPEAK, VANCORANDOM, GENTTROUGH, GENTPEAK, GENTRANDOM, TOBRATROUGH, TOBRAPEAK, TOBRARND, AMIKACINPEAK, AMIKACINTROU, AMIKACIN in the last 72 hours.   Microbiology: No results found for this or any previous visit (from the past 720 hour(s)).  Anti-infectives    Start     Dose/Rate Route Frequency Ordered Stop   11/05/14 2000  vancomycin (VANCOCIN) IVPB 1000 mg/200 mL premix     1,000 mg 200 mL/hr over 60 Minutes Intravenous  Once 11/05/14 1953 11/05/14 2211      Assessment: 49 yo F with 2 week hx of R chest wall/ breast lesion that has worsened.  She had been prescribed Doxycycline as outpatient, but stopped taking medication due to adverse effect. Referred from PCP office for IV antibiotics and draining of pus-filled pockets in affected area. She is afebrile with persistent leukocytosis.  Renal function is at patient's baseline.  Vancomycin 4/14>>   Goal of Therapy:  Vancomycin trough level 10-15 mcg/ml  Plan:  Vancomycin 1gm IV q12h Check Vancomycin trough at steady state Monitor renal function and cx data   Biagio Borg 11/05/2014,11:02 PM

## 2014-11-05 NOTE — Progress Notes (Addendum)
Notified MD, pt refuses blood glucose checks, insulin, and heparin. Pt states she does not want to be stuck and wants to take her usual medication by mouth. Pt states she is also on a regular diet at home and does not check her blood sugar. Pt states she only drinks coke and refuses diet drinks. MD aware.

## 2014-11-06 ENCOUNTER — Inpatient Hospital Stay (HOSPITAL_COMMUNITY): Payer: 59

## 2014-11-06 LAB — COMPREHENSIVE METABOLIC PANEL
ALT: 17 U/L (ref 0–35)
AST: 19 U/L (ref 0–37)
Albumin: 3 g/dL — ABNORMAL LOW (ref 3.5–5.2)
Alkaline Phosphatase: 54 U/L (ref 39–117)
Anion gap: 10 (ref 5–15)
BUN: 8 mg/dL (ref 6–23)
CO2: 26 mmol/L (ref 19–32)
Calcium: 8.5 mg/dL (ref 8.4–10.5)
Chloride: 102 mmol/L (ref 96–112)
Creatinine, Ser: 0.56 mg/dL (ref 0.50–1.10)
GFR calc Af Amer: >90 mL/min (ref >90)
GFR calc non Af Amer: >90 mL/min (ref >90)
Glucose, Bld: 235 mg/dL — ABNORMAL HIGH (ref 70–99)
Potassium: 3.7 mmol/L (ref 3.5–5.1)
Sodium: 138 mmol/L (ref 135–145)
Total Bilirubin: 0.4 mg/dL (ref 0.3–1.2)
Total Protein: 6.4 g/dL (ref 6.0–8.3)

## 2014-11-06 LAB — CBC
HCT: 35.8 % — ABNORMAL LOW (ref 36.0–46.0)
Hemoglobin: 11.4 g/dL — ABNORMAL LOW (ref 12.0–15.0)
MCH: 29.8 pg (ref 26.0–34.0)
MCHC: 31.8 g/dL (ref 30.0–36.0)
MCV: 93.5 fL (ref 78.0–100.0)
Platelets: 290 10*3/uL (ref 150–400)
RBC: 3.83 MIL/uL — ABNORMAL LOW (ref 3.87–5.11)
RDW: 14.5 % (ref 11.5–15.5)
WBC: 17 10*3/uL — ABNORMAL HIGH (ref 4.0–10.5)

## 2014-11-06 MED ORDER — SITAGLIPTIN PHOS-METFORMIN HCL 50-1000 MG PO TABS
1.0000 | ORAL_TABLET | Freq: Two times a day (BID) | ORAL | Status: DC
  Administered 2014-11-07 – 2014-11-09 (×4): 1 via ORAL
  Filled 2014-11-06: qty 1

## 2014-11-06 MED ORDER — PIPERACILLIN-TAZOBACTAM 3.375 G IVPB
3.3750 g | Freq: Three times a day (TID) | INTRAVENOUS | Status: DC
  Administered 2014-11-06 – 2014-11-09 (×9): 3.375 g via INTRAVENOUS
  Filled 2014-11-06 (×13): qty 50

## 2014-11-06 MED ORDER — VANCOMYCIN HCL IN DEXTROSE 1-5 GM/200ML-% IV SOLN
INTRAVENOUS | Status: AC
  Filled 2014-11-06: qty 200

## 2014-11-06 MED ORDER — HYDROMORPHONE HCL 1 MG/ML IJ SOLN: 1.0000 mg | INTRAMUSCULAR | Status: DC | PRN

## 2014-11-06 MED ORDER — DEXTROSE-NACL 5-0.9 % IV SOLN
INTRAVENOUS | Status: DC
  Administered 2014-11-06 – 2014-11-07 (×2): via INTRAVENOUS

## 2014-11-06 NOTE — Care Management Utilization Note (Signed)
UR completed 
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
## 2014-11-06 NOTE — Progress Notes (Signed)
Pt has refused blood glucose checks. Pt has indicated that she does not want to be stuck. Will continue to monitor patient throughout shift. Per nurse from previous shift MD aware.

## 2014-11-06 NOTE — Progress Notes (Signed)
Pt requesting to see MD. Pt is very upset that she is not going to have surgery today. Pt states that she does not want to just lay around. MD notified and has spoken with patient.

## 2014-11-06 NOTE — Progress Notes (Signed)
Pt has refused blood glucose checks, insulin and heparin. Pt has indicated that she does not want to be stuck and will only take her usual medication by mouth. Pt refuses any diet products. MD aware.

## 2014-11-06 NOTE — Care Management Note (Addendum)
    Page 1 of 1   11/09/2014     11:13:53 AM CARE MANAGEMENT NOTE 11/09/2014  Patient:  [name], [name]   Account Number:  [id number]  Date Initiated:  11/06/2014  Documentation initiated by:  Jolene Provost  Subjective/Objective Assessment:   Pt is from home, lives with husband and son. Pt independent at baseline. Pt plans to discharge home with self care. No CM needs.     Action/Plan:   Anticipated DC Date:  11/07/2014   Anticipated DC Plan:  [place]  CM consult      Choice offered to / List presented to:             Status of service:  Completed, signed off Medicare Important Message given?   (If response is "NO", the following Medicare IM given date fields will be blank) Date Medicare IM given:   Medicare IM given by:   Date Additional Medicare IM given:   Additional Medicare IM given by:    Discharge Disposition:  HOME/SELF CARE  Per UR Regulation:  Reviewed for med. necessity/level of care/duration of stay  If discussed at [place] of Stay Meetings, dates discussed:    Comments:  11/09/2014 [place], RN, MSN, CM Pt discharging home today with self care. No CM needs. 11/06/2014 [place], RN, MSN, CM

## 2014-11-07 ENCOUNTER — Inpatient Hospital Stay (HOSPITAL_COMMUNITY): Payer: 59

## 2014-11-07 LAB — SURGICAL PCR SCREEN
MRSA, PCR: NEGATIVE
STAPHYLOCOCCUS AUREUS: NEGATIVE

## 2014-11-07 MED ORDER — FUROSEMIDE 10 MG/ML IJ SOLN
20.0000 mg | Freq: Once | INTRAMUSCULAR | Status: AC
  Administered 2014-11-07: 20 mg via INTRAVENOUS
  Filled 2014-11-07: qty 2

## 2014-11-07 MED ORDER — ACETAMINOPHEN 325 MG PO TABS
650.0000 mg | ORAL_TABLET | ORAL | Status: DC | PRN
  Administered 2014-11-07 – 2014-11-09 (×2): 650 mg via ORAL
  Filled 2014-11-07 (×3): qty 2

## 2014-11-07 MED ORDER — SODIUM CHLORIDE 0.9 % IV BOLUS (SEPSIS)
500.0000 mL | Freq: Once | INTRAVENOUS | Status: AC
  Administered 2014-11-07: 500 mL via INTRAVENOUS

## 2014-11-07 MED ORDER — CHLORHEXIDINE GLUCONATE 4 % EX LIQD
1.0000 "application " | Freq: Once | CUTANEOUS | Status: AC
  Administered 2014-11-07: 1 via TOPICAL
  Filled 2014-11-07: qty 15

## 2014-11-07 NOTE — Progress Notes (Signed)
Pt has refused her wellbutrin medication. Pt states that she does not want to be stuck and has refused blood glucose checks. Pt is insisting that she is not a diabetic.

## 2014-11-07 NOTE — Progress Notes (Signed)
Pt has refused her heparin shot. Pt states she does not need it.

## 2014-11-07 NOTE — Progress Notes (Signed)
Pt states that she is not a diabetic and does not wish to be on a carb modified diet. Pt states that she wants a regular diet and no diet drinks. Pt refuses diet sodas. MD notified and made aware.
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
## 2014-11-07 NOTE — Progress Notes (Signed)
Received verbal order with readback for lasix 20 mg to be administered once to patient. Lasix 20 mg was administered.
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
## 2014-11-07 NOTE — Progress Notes (Signed)
Pt has temperature of 101.5. Received verbal order with readback to administer Tylenol 650 mg Q4 hrs PRN. Pt has refused to take the Tylenol. Pt stated she does not take Tylenol just pain medication only.

## 2014-11-07 NOTE — Progress Notes (Signed)
Pt's O2 saturation 87%. Pt placed on 2L O2 via nasal cannula and O2 saturation now 92%. Will continue to monitor patient.

## 2014-11-08 ENCOUNTER — Encounter (HOSPITAL_COMMUNITY)
Admission: EM | Disposition: A | Discharge status: Home / Self Care | Payer: Self-pay | Source: Home / Self Care | Attending: Internal Medicine

## 2014-11-08 ENCOUNTER — Inpatient Hospital Stay (HOSPITAL_COMMUNITY): Payer: 59 | Admitting: Anesthesiology

## 2014-11-08 HISTORY — PX: INCISION AND DRAINAGE ABSCESS: SHX5864

## 2014-11-08 LAB — BASIC METABOLIC PANEL
Anion gap: 8 (ref 5–15)
BUN: 14 mg/dL (ref 6–23)
CO2: 22 mmol/L (ref 19–32)
Calcium: 7.4 mg/dL — ABNORMAL LOW (ref 8.4–10.5)
Chloride: 111 mmol/L (ref 96–112)
Creatinine, Ser: 1.31 mg/dL — ABNORMAL HIGH (ref 0.50–1.10)
GFR calc Af Amer: 56 mL/min — ABNORMAL LOW (ref >90)
GFR calc non Af Amer: 48 mL/min — ABNORMAL LOW (ref >90)
Glucose, Bld: 125 mg/dL — ABNORMAL HIGH (ref 70–99)
POTASSIUM: 4 mmol/L (ref 3.5–5.1)
Sodium: 141 mmol/L (ref 135–145)

## 2014-11-08 LAB — LACTIC ACID, PLASMA
Lactic Acid, Venous: 1.8 mmol/L (ref 0.5–2.0)
Lactic Acid, Venous: 1.2 mmol/L (ref 0.5–2.0)

## 2014-11-08 LAB — VANCOMYCIN, TROUGH: Vancomycin Tr: 17 ug/mL (ref 10.0–20.0)

## 2014-11-08 SURGERY — INCISION AND DRAINAGE, ABSCESS
Anesthesia: General | Site: Breast | Laterality: Right

## 2014-11-08 MED ORDER — PROPOFOL 10 MG/ML IV BOLUS
INTRAVENOUS | Status: AC
  Filled 2014-11-08: qty 20

## 2014-11-08 MED ORDER — ONDANSETRON HCL 4 MG/2ML IJ SOLN
INTRAMUSCULAR | Status: AC
  Filled 2014-11-08: qty 2

## 2014-11-08 MED ORDER — SODIUM CHLORIDE 0.9 % IV BOLUS (SEPSIS)
1000.0000 mL | Freq: Once | INTRAVENOUS | Status: AC
  Administered 2014-11-08: 1000 mL via INTRAVENOUS

## 2014-11-08 MED ORDER — MIDAZOLAM HCL 2 MG/2ML IJ SOLN
INTRAMUSCULAR | Status: DC | PRN
  Administered 2014-11-08: 2 mg via INTRAVENOUS

## 2014-11-08 MED ORDER — ONDANSETRON HCL 4 MG/2ML IJ SOLN
INTRAMUSCULAR | Status: DC | PRN
  Administered 2014-11-08: 4 mg via INTRAVENOUS

## 2014-11-08 MED ORDER — 0.9 % SODIUM CHLORIDE (POUR BTL) OPTIME
TOPICAL | Status: DC | PRN
  Administered 2014-11-08: 1000 mL

## 2014-11-08 MED ORDER — SODIUM CHLORIDE 0.9 % IV SOLN
INTRAVENOUS | Status: DC | PRN
  Administered 2014-11-08: 08:00:00 via INTRAVENOUS

## 2014-11-08 MED ORDER — FENTANYL CITRATE (PF) 100 MCG/2ML IJ SOLN
INTRAMUSCULAR | Status: AC
  Filled 2014-11-08: qty 2

## 2014-11-08 MED ORDER — LIDOCAINE HCL (PF) 1 % IJ SOLN
INTRAMUSCULAR | Status: AC
  Filled 2014-11-08: qty 5

## 2014-11-08 MED ORDER — LIDOCAINE HCL (CARDIAC) 10 MG/ML IV SOLN
INTRAVENOUS | Status: DC | PRN
  Administered 2014-11-08: 50 mg via INTRAVENOUS

## 2014-11-08 MED ORDER — ALBUTEROL SULFATE (2.5 MG/3ML) 0.083% IN NEBU
2.5000 mg | INHALATION_SOLUTION | RESPIRATORY_TRACT | Status: DC | PRN
  Administered 2014-11-08 – 2014-11-09 (×2): 2.5 mg via RESPIRATORY_TRACT
  Filled 2014-11-08: qty 3

## 2014-11-08 MED ORDER — FENTANYL CITRATE (PF) 100 MCG/2ML IJ SOLN
INTRAMUSCULAR | Status: DC | PRN
  Administered 2014-11-08: 50 ug via INTRAVENOUS
  Administered 2014-11-08: 25 ug via INTRAVENOUS
  Administered 2014-11-08: 50 ug via INTRAVENOUS
  Administered 2014-11-08: 25 ug via INTRAVENOUS
  Administered 2014-11-08 (×2): 50 ug via INTRAVENOUS

## 2014-11-08 MED ORDER — PROPOFOL 10 MG/ML IV BOLUS
INTRAVENOUS | Status: DC | PRN
  Administered 2014-11-08: 150 mg via INTRAVENOUS
  Administered 2014-11-08: 50 mg via INTRAVENOUS

## 2014-11-08 MED ORDER — SODIUM CHLORIDE 0.9 % IN NEBU
INHALATION_SOLUTION | RESPIRATORY_TRACT | Status: AC
  Filled 2014-11-08: qty 3

## 2014-11-08 MED ORDER — KETOROLAC TROMETHAMINE 30 MG/ML IJ SOLN
INTRAMUSCULAR | Status: AC
  Filled 2014-11-08: qty 1

## 2014-11-08 MED ORDER — ALBUTEROL SULFATE (2.5 MG/3ML) 0.083% IN NEBU
INHALATION_SOLUTION | RESPIRATORY_TRACT | Status: AC
  Filled 2014-11-08: qty 3

## 2014-11-08 MED ORDER — MIDAZOLAM HCL 2 MG/2ML IJ SOLN
INTRAMUSCULAR | Status: AC
  Filled 2014-11-08: qty 2

## 2014-11-08 MED ORDER — KETOROLAC TROMETHAMINE 30 MG/ML IJ SOLN
30.0000 mg | Freq: Once | INTRAMUSCULAR | Status: AC
  Administered 2014-11-08: 30 mg via INTRAVENOUS

## 2014-11-08 SURGICAL SUPPLY — 25 items
BAG HAMPER (MISCELLANEOUS) ×2 IMPLANT
BNDG CONFORM 2 STRL LF (GAUZE/BANDAGES/DRESSINGS) IMPLANT
CLOTH BEACON ORANGE TIMEOUT ST (SAFETY) ×2 IMPLANT
COVER LIGHT HANDLE STERIS (MISCELLANEOUS) ×4 IMPLANT
ELECT REM PT RETURN 9FT ADLT (ELECTROSURGICAL) ×2
ELECTRODE REM PT RTRN 9FT ADLT (ELECTROSURGICAL) ×1 IMPLANT
GAUZE PACKING 2X5 YD STRL (GAUZE/BANDAGES/DRESSINGS) ×2 IMPLANT
GAUZE SPONGE 4X4 12PLY STRL (GAUZE/BANDAGES/DRESSINGS) ×2 IMPLANT
GAUZE SPONGE 4X4 12PLY STRL LF (GAUZE/BANDAGES/DRESSINGS) ×2 IMPLANT
GLOVE BIOGEL M STRL SZ7.5 (GLOVE) ×2 IMPLANT
GLOVE SURG SS PI 7.5 STRL IVOR (GLOVE) ×2 IMPLANT
GOWN STRL REUS W/TWL LRG LVL3 (GOWN DISPOSABLE) ×4 IMPLANT
KIT ROOM TURNOVER APOR (KITS) ×2 IMPLANT
MANIFOLD NEPTUNE II (INSTRUMENTS) ×2 IMPLANT
MARKER SKIN DUAL TIP RULER LAB (MISCELLANEOUS) ×2 IMPLANT
NS IRRIG 1000ML POUR BTL (IV SOLUTION) ×2 IMPLANT
PACK BASIC LIMB (CUSTOM PROCEDURE TRAY) IMPLANT
PACK MINOR (CUSTOM PROCEDURE TRAY) ×2 IMPLANT
PAD ABD 5X9 TENDERSORB (GAUZE/BANDAGES/DRESSINGS) ×2 IMPLANT
PAD ARMBOARD 7.5X6 YLW CONV (MISCELLANEOUS) ×2 IMPLANT
SET BASIN LINEN APH (SET/KITS/TRAYS/PACK) ×2 IMPLANT
SWAB CULTURE LIQ STUART DBL (MISCELLANEOUS) ×2 IMPLANT
SYR BULB IRRIGATION 50ML (SYRINGE) ×2 IMPLANT
TAPE MEDIFIX FOAM 3 (GAUZE/BANDAGES/DRESSINGS) ×2 IMPLANT
TUBE ANAEROBIC PORT A CUL  W/M (MISCELLANEOUS) ×2 IMPLANT

## 2014-11-08 NOTE — Progress Notes (Signed)
Pt has difficulty maintaining sao2 above 90 with n/c level 85. Incentive spirometry given and instructed only able to reach 500, Dr. Arnoldo Morale notified of her respiratory status, nebulizer treatments ordered and one given in the pacu, pt demonstrates deeper respiratory effort, however will keep pt on non rebreathing o2 mask as ordered by Dr. Arnoldo Morale. Lungs are clear however diminished, non productive dry cough noted.

## 2014-11-08 NOTE — Addendum Note (Signed)
Addendum  created 11/08/14 0943 by Vista Deck, CRNA   Modules edited: Clinical Notes   Clinical Notes:  File: [id number]

## 2014-11-08 NOTE — Anesthesia Procedure Notes (Addendum)
Procedure Name: LMA Insertion Date/Time: 11/08/2014 8:25 AM Performed by: Vista Deck Pre-anesthesia Checklist: Patient identified, Patient being monitored, Emergency Drugs available, Timeout performed and Suction available Patient Re-evaluated:Patient Re-evaluated prior to inductionOxygen Delivery Method: Circle System Utilized Preoxygenation: Pre-oxygenation with 100% oxygen Intubation Type: IV induction Ventilation: Mask ventilation without difficulty LMA: LMA inserted LMA Size: 4.0 Number of attempts: 1 Placement Confirmation: positive ETCO2 and breath sounds checked- equal and bilateral Tube secured with: Tape   Procedure Name: MAC Date/Time: 11/08/2014 7:42 AM Performed by: Vista Deck Pre-anesthesia Checklist: Patient identified, Emergency Drugs available, Suction available, Timeout performed and Patient being monitored Patient Re-evaluated:Patient Re-evaluated prior to inductionOxygen Delivery Method: Non-rebreather mask

## 2014-11-08 NOTE — Progress Notes (Signed)
Pt placed on non rebreather O2 mask as ordered by Dr. Arnoldo Morale to maintain an O2 saturation above 90. However, patient is not compliant with the use of the O2 mask. Pt's O2 saturation is 87% but patient has refused the use of O2. Pt stated that her mother's oxygen levels was in the "80's and she did fine." Pt has been educated about the use and importance of maintaining an O2 level above 90. Will continue to monitor patient throughout the shift. Pt has also been instructed to use the incentive spirometry pt has not made any attempts to utilize the spirometry. Will continue to encourage patient to use incentive spirometry.

## 2014-11-08 NOTE — Op Note (Signed)
Patient:  [name]  DOB:  1965-11-29  MRN:  [id number]   Preop Diagnosis:  Abscess, right breast  Postop Diagnosis:  Same  Procedure:  Incision and drainage of abscess, right breast  Surgeon:  Aviva Signs, M.D.  Anes:  Gen.  Indications:  Patient is a 49 year old white female who presents with a right breast abscess. The risks and benefits of the procedure were fully explained to the patient, who gave informed consent. She realizes that the wound will be left open.  Procedure note:  The patient was placed the supine position. After general anesthesia was administered, the right breast and chest wall were prepped and draped using usual sterile technique with Betadine. Surgical site confirmation was performed.  Incision was made and a fluctuant mass along the medial inframammary fold. Purulent fluid was found. Aerobic and anaerobic cultures were taken and sent to microbiology. The abscess cavity tracked along the posterior inferior aspect of the right breast as well as inferiorly down the chest wall. The cavity was copiously irrigated normal saline. A bleeding was controlled using Bovie electrocautery. The wound was packed with Betadine impregnated gauze. A dry sterile dressing was then applied.  All tape and needle counts were correct at the end of the procedure. Patient was awakened and transferred to PACU in stable condition.  Complications:  None  EBL:  Minimal  Specimen:  Cultures of right breast abscess

## 2014-11-08 NOTE — Anesthesia Postprocedure Evaluation (Signed)
  Anesthesia Post-op Note  Patient: [name]  Procedure(s) Performed: Procedure(s): INCISION AND DRAINAGE ABSCESS (Right)  Patient Location: PACU  Anesthesia Type:General  Level of Consciousness: awake, alert  and patient cooperative  Airway and Oxygen Therapy: Patient Spontanous Breathing and Patient connected to nasal cannula oxygen  Post-op Pain: moderate  Post-op Assessment: Post-op Vital signs reviewed, Patient's Cardiovascular Status Stable, Respiratory Function Stable, Patent Airway and No signs of Nausea or vomiting  Post-op Vital Signs: Reviewed and stable   Complications: No apparent anesthesia complications

## 2014-11-08 NOTE — Progress Notes (Signed)
Pt has refused blood glucose checks. Pt states that she has no issues with her blood glucose. Pt also has refused diet drinks.
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
## 2014-11-08 NOTE — Progress Notes (Signed)
ANTIBIOTIC CONSULT NOTE  Pharmacy Consult for Vancomycin  Indication: cellulitis  Allergies  Allergen Reactions  . Doxycycline Nausea And Vomiting  . Penicillins Other (See Comments)    Unknown as a child.    Patient Measurements: Height: 5\' 6"  (167.6 cm) Weight: 174 lb (78.926 kg) IBW/kg (Calculated) : 59.3  Vital Signs: Temp: 98 F (36.7 C) (04/17 0214) Temp Source: Oral (04/17 0214) BP: 94/53 mmHg (04/17 0345) Pulse Rate: 93 (04/17 0214) Intake/Output from previous day: 04/16 0701 - 04/17 0700 In: 3688.8 [I.V.:1888.8; IV Piggyback:1800] Out: -  Intake/Output from this shift: Total I/O In: 500 [I.V.:500] Out: 15 [Blood:15]  Labs:  Recent Labs  11/05/14 2011 11/06/14 0632 11/08/14 0503  WBC 17.0* 17.0*  --   HGB 12.3 11.4*  --   PLT 305 290  --   CREATININE 0.60 0.56 1.31*   Estimated Creatinine Clearance: 56.8 mL/min (by C-G formula based on Cr of 1.31).  Recent Labs  11/08/14 0503  VANCOTROUGH 17.0     Microbiology: Recent Results (from the past 720 hour(s))  Surgical pcr screen     Status: None   Collection Time: 11/07/14  8:10 PM  Result Value Ref Range Status   MRSA, PCR NEGATIVE NEGATIVE Final   Staphylococcus aureus NEGATIVE NEGATIVE Final    Comment:        The Xpert SA Assay (FDA approved for NASAL specimens in patients over 31 years of age), is one component of a comprehensive surveillance program.  Test performance has been validated by [facility] for patients greater than or equal to 36 year old. It is not intended to diagnose infection nor to guide or monitor treatment.     Anti-infectives    Start     Dose/Rate Route Frequency Ordered Stop   11/06/14 1200  piperacillin-tazobactam (ZOSYN) IVPB 3.375 g     3.375 g 12.5 mL/hr over 240 Minutes Intravenous Every 8 hours 11/06/14 1100     11/06/14 0600  vancomycin (VANCOCIN) IVPB 1000 mg/200 mL premix     1,000 mg 200 mL/hr over 60 Minutes Intravenous Every 12 hours 11/05/14  2306     11/05/14 2000  vancomycin (VANCOCIN) IVPB 1000 mg/200 mL premix     1,000 mg 200 mL/hr over 60 Minutes Intravenous  Once 11/05/14 1953 11/05/14 2211      Assessment: 49 yo F with 2 week hx of R chest wall/ breast lesion that has worsened.  She had been prescribed Doxycycline as outpatient, but stopped taking medication due to adverse effect. Referred from PCP office for IV antibiotics and draining of pus-filled pockets in affected area. Incision and drainage of abscess, right breast completed 11/08/14. Purulent fluid was found. Aerobic and anaerobic cultures were taken and sent to microbiology. Vancomycin trough at goal this AM.  Vancomycin 4/14>>   Goal of Therapy:  Vancomycin trough level 10-15 mcg/ml  Plan:  Continue Vancomycin 1gm IV q12h Check Vancomycin weekly unless clinical condition changes Monitor renal function and cx data   Abner Greenspan, Ashad Fawbush Bennett 11/08/2014,8:55 AM

## 2014-11-08 NOTE — Addendum Note (Signed)
Addendum  created 11/08/14 1357 by Vista Deck, CRNA   Modules edited: Anesthesia Events, Anesthesia Flowsheet, Narrator   Narrator:  Narrator: Event Log Edited

## 2014-11-08 NOTE — Progress Notes (Signed)
Pt continues to refuse Heparin SQ. Pt states she does not need Heparin. MD notified and made aware.

## 2014-11-08 NOTE — Transfer of Care (Signed)
Immediate Anesthesia Transfer of Care Note  Patient: [name]  Procedure(s) Performed: Procedure(s): INCISION AND DRAINAGE ABSCESS (Right)  Patient Location: PACU  Anesthesia Type:General  Level of Consciousness: sedated and patient cooperative  Airway & Oxygen Therapy: Patient Spontanous Breathing and non-rebreather face mask  Post-op Assessment: Report given to RN, Post -op Vital signs reviewed and stable and Patient moving all extremities  Post vital signs: Reviewed and stable   Complications: No apparent anesthesia complications

## 2014-11-09 ENCOUNTER — Encounter (HOSPITAL_COMMUNITY): Payer: Self-pay | Admitting: General Surgery

## 2014-11-09 DIAGNOSIS — D72829 Elevated white blood cell count, unspecified: Secondary | ICD-10-CM | POA: Diagnosis present

## 2014-11-09 LAB — BASIC METABOLIC PANEL
ANION GAP: 9 (ref 5–15)
BUN: 12 mg/dL (ref 6–23)
CALCIUM: 7.7 mg/dL — AB (ref 8.4–10.5)
CO2: 24 mmol/L (ref 19–32)
Chloride: 110 mmol/L (ref 96–112)
Creatinine, Ser: 1.14 mg/dL — ABNORMAL HIGH (ref 0.50–1.10)
GFR calc Af Amer: 66 mL/min — ABNORMAL LOW (ref >90)
GFR calc non Af Amer: 57 mL/min — ABNORMAL LOW (ref >90)
GLUCOSE: 172 mg/dL — AB (ref 70–99)
Potassium: 4.2 mmol/L (ref 3.5–5.1)
Sodium: 143 mmol/L (ref 135–145)

## 2014-11-09 LAB — CBC
HCT: 32.9 % — ABNORMAL LOW (ref 36.0–46.0)
Hemoglobin: 10.1 g/dL — ABNORMAL LOW (ref 12.0–15.0)
MCH: 28.9 pg (ref 26.0–34.0)
MCHC: 30.7 g/dL (ref 30.0–36.0)
MCV: 94 fL (ref 78.0–100.0)
PLATELETS: 291 10*3/uL (ref 150–400)
RBC: 3.5 MIL/uL — ABNORMAL LOW (ref 3.87–5.11)
RDW: 15.2 % (ref 11.5–15.5)
WBC: 17.2 10*3/uL — ABNORMAL HIGH (ref 4.0–10.5)

## 2014-11-09 MED ORDER — OXYCODONE-ACETAMINOPHEN 10-325 MG PO TABS: 1.0000 | ORAL_TABLET | Freq: Four times a day (QID) | ORAL | Status: DC | PRN

## 2014-11-09 MED ORDER — SULFAMETHOXAZOLE-TRIMETHOPRIM 400-80 MG PO TABS: 1.0000 | ORAL_TABLET | Freq: Two times a day (BID) | ORAL | Status: DC
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
## 2014-11-09 NOTE — Progress Notes (Signed)
Pt discharged home today per Dr. Le.  Pt's IV site D/C'd and WDL.  Pt's VSS.  Pt provided with home medication list, discharge instructions and prescriptions.  Verbalized understanding.  Pt left floor via WC in stable condition accompanied by NT. 

## 2014-11-09 NOTE — Discharge Instructions (Signed)
Clean wound with q-tip and peroxide twice a day 

## 2014-11-09 NOTE — Progress Notes (Signed)
UR chart review completed.  

## 2014-11-09 NOTE — Progress Notes (Signed)
1 Day Post-Op  Subjective: Patient states her breathing is much better. She is ambulating around the hallways without difficulty.  Objective: Vital signs in last 24 hours: Temp:  [98 F (36.7 C)-101.4 F (38.6 C)] 99.2 F (37.3 C) (04/18 0900) Pulse Rate:  [80-126] 101 (04/18 0900) Resp:  [18-23] 18 (04/18 0900) BP: (84-100)/(35-54) 85/51 mmHg (04/18 0900) SpO2:  [68 %-99 %] 93 % (04/18 0900) Weight:  [84.7 kg (186 lb 11.7 oz)] 84.7 kg (186 lb 11.7 oz) (04/18 0615) Last BM Date: 11/04/14  Intake/Output from previous day: 04/17 0701 - 04/18 0700 In: 3365 [P.O.:340; I.V.:2375; IV Piggyback:650] Out: 1116 [Urine:1100; Stool:1; Blood:15] Intake/Output this shift:    General appearance: alert, cooperative and no distress Breasts: Wound healing well. Packing removed.  Lab Results:   Recent Labs  11/09/14 0532  WBC 17.2*  HGB 10.1*  HCT 32.9*  PLT 291   BMET  Recent Labs  11/08/14 0503 11/09/14 0532  NA 141 143  K 4.0 4.2  CL 111 110  CO2 22 24  GLUCOSE 125* 172*  BUN 14 12  CREATININE 1.31* 1.14*  CALCIUM 7.4* 7.7*   PT/INR No results for input(s): LABPROT, INR in the last 72 hours.  Studies/Results: Dg Chest Port 1 View  11/07/2014   CLINICAL DATA:  Cough and fever, will worsening for the past 2 days.  EXAM: PORTABLE CHEST - 1 VIEW  COMPARISON:  12/28/2011.  FINDINGS: Poor inspiration. Grossly stable normal sized heart. Increased prominence of the pulmonary vasculature and interstitial markings. Mild ill-defined airspace opacity in the majority of the left lung and in the right lower lung zone. No pleural fluid. Unremarkable bones.  IMPRESSION: 1. Interval interstitial pulmonary edema or interstitial pneumonitis. 2. Mild bilateral alveolar edema or pneumonia.   Electronically Signed   By: Claudie Revering M.D.   On: 11/07/2014 11:27    Anti-infectives: Anti-infectives    Start     Dose/Rate Route Frequency Ordered Stop   11/06/14 1200  piperacillin-tazobactam  (ZOSYN) IVPB 3.375 g     3.375 g 12.5 mL/hr over 240 Minutes Intravenous Every 8 hours 11/06/14 1100     11/06/14 0600  vancomycin (VANCOCIN) IVPB 1000 mg/200 mL premix     1,000 mg 200 mL/hr over 60 Minutes Intravenous Every 12 hours 11/05/14 2306     11/05/14 2000  vancomycin (VANCOCIN) IVPB 1000 mg/200 mL premix     1,000 mg 200 mL/hr over 60 Minutes Intravenous  Once 11/05/14 1953 11/05/14 2211      Assessment/Plan: s/p Procedure(s): INCISION AND DRAINAGE ABSCESS Impression: Stable status post incision and drainage of right breast abscess.  Plan: Okay for discharge from surgery standpoint. Would place on Bactrim for 10 days. Instructions given to patient and husband concerning wound care. Will follow-up in my office in one week.  LOS: 4 days    [name] A 11/09/2014

## 2014-11-12 LAB — CULTURE, ROUTINE-ABSCESS

## 2014-11-13 LAB — ANAEROBIC CULTURE

## 2015-01-05 ENCOUNTER — Other Ambulatory Visit: Payer: Self-pay | Admitting: Cardiology

## 2015-03-31 ENCOUNTER — Other Ambulatory Visit: Payer: Self-pay | Admitting: Cardiology

## 2015-03-31 DIAGNOSIS — I6523 Occlusion and stenosis of bilateral carotid arteries: Secondary | ICD-10-CM

## 2015-04-07 ENCOUNTER — Ambulatory Visit (HOSPITAL_COMMUNITY)
Admission: RE | Admit: 2015-04-07 | Discharge: 2015-04-07 | Disposition: A | Discharge status: Home / Self Care | Payer: BLUE CROSS/BLUE SHIELD | Source: Ambulatory Visit | Attending: Cardiovascular Disease | Admitting: Cardiovascular Disease

## 2015-04-07 DIAGNOSIS — F17200 Nicotine dependence, unspecified, uncomplicated: Secondary | ICD-10-CM | POA: Insufficient documentation

## 2015-04-07 DIAGNOSIS — I10 Essential (primary) hypertension: Secondary | ICD-10-CM | POA: Diagnosis not present

## 2015-04-07 DIAGNOSIS — I6523 Occlusion and stenosis of bilateral carotid arteries: Secondary | ICD-10-CM | POA: Insufficient documentation

## 2015-04-07 DIAGNOSIS — I739 Peripheral vascular disease, unspecified: Secondary | ICD-10-CM

## 2015-04-07 DIAGNOSIS — I779 Disorder of arteries and arterioles, unspecified: Secondary | ICD-10-CM

## 2015-04-13 ENCOUNTER — Encounter: Payer: Self-pay | Admitting: *Deleted

## 2015-04-16 ENCOUNTER — Encounter: Payer: Self-pay | Admitting: Cardiology

## 2015-04-16 ENCOUNTER — Ambulatory Visit (INDEPENDENT_AMBULATORY_CARE_PROVIDER_SITE_OTHER): Payer: BLUE CROSS/BLUE SHIELD | Admitting: Cardiology

## 2015-04-16 VITALS — BP 85/58 | HR 74 | Ht 66.0 in | Wt 166.0 lb

## 2015-04-16 DIAGNOSIS — R9430 Abnormal result of cardiovascular function study, unspecified: Secondary | ICD-10-CM

## 2015-04-16 DIAGNOSIS — Z720 Tobacco use: Secondary | ICD-10-CM | POA: Diagnosis not present

## 2015-04-16 DIAGNOSIS — Z9889 Other specified postprocedural states: Secondary | ICD-10-CM

## 2015-04-16 DIAGNOSIS — I739 Peripheral vascular disease, unspecified: Secondary | ICD-10-CM

## 2015-04-16 DIAGNOSIS — I779 Disorder of arteries and arterioles, unspecified: Secondary | ICD-10-CM

## 2015-04-16 DIAGNOSIS — R0989 Other specified symptoms and signs involving the circulatory and respiratory systems: Secondary | ICD-10-CM

## 2015-04-16 DIAGNOSIS — I2510 Atherosclerotic heart disease of native coronary artery without angina pectoris: Secondary | ICD-10-CM

## 2015-04-16 DIAGNOSIS — Z95828 Presence of other vascular implants and grafts: Secondary | ICD-10-CM

## 2015-04-16 MED ORDER — METOPROLOL TARTRATE 25 MG PO TABS: 25.0000 mg | ORAL_TABLET | Freq: Two times a day (BID) | ORAL | Status: DC

## 2015-04-16 MED ORDER — ATORVASTATIN CALCIUM 80 MG PO TABS: ORAL_TABLET | ORAL | Status: DC

## 2015-04-16 MED ORDER — CLOPIDOGREL BISULFATE 75 MG PO TABS
75.0000 mg | ORAL_TABLET | Freq: Every day | ORAL | Status: DC
Start: 2015-04-16 — End: 2016-01-31
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
## 2015-04-16 NOTE — Patient Instructions (Signed)
Medication Instructions:  Stop taking Benazepril-all other medications remain the same.   Labwork: None  Testing/Procedures: None  Follow-Up: Your physician wants you to follow-up in: 1 year with Dr Jacinta Shoe in our [place] office. You will receive a reminder letter in the mail two months in advance. If you don't receive a letter, please call our office to schedule the follow-up appointment.
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
## 2015-04-16 NOTE — Assessment & Plan Note (Signed)
As always the patient is counseled to stop smoking. I do believe she wants to try to do this but she has been unable. She will continue to try.

## 2015-04-16 NOTE — Assessment & Plan Note (Signed)
The patient has very significant carotid disease. I have listed the results of her Dopplers in the problem list. Her most recent Doppler was September, 2016. She has stable bilateral 60-79% disease. Her lesions have been so stable that she is be followed up on a one-year basis.

## 2015-04-16 NOTE — Assessment & Plan Note (Signed)
In reviewing the record today, I realized that we do not have any echo data in the recent past. We know that her ejection fraction was in the 55% range in 2010. I decided not to proceed with an echo at this time. However, when she seen in the future follow-up echocardiography would be appropriate.

## 2015-04-16 NOTE — Assessment & Plan Note (Signed)
The patient had a drug-eluting stent to the RCA for a myocardial infarction in 2007. She had a nuclear scan in 2010. There was no scar or ischemia. The ejection fraction was in the normal range. I have chosen not to repeat an exercise test at this time.

## 2015-09-08 ENCOUNTER — Other Ambulatory Visit: Payer: Self-pay | Admitting: Oncology

## 2015-09-08 ENCOUNTER — Other Ambulatory Visit: Payer: Self-pay

## 2015-09-08 DIAGNOSIS — N63 Unspecified lump in unspecified breast: Secondary | ICD-10-CM

## 2015-09-29 ENCOUNTER — Ambulatory Visit
Admission: RE | Admit: 2015-09-29 | Discharge: 2015-09-29 | Disposition: A | Discharge status: Home / Self Care | Payer: BLUE CROSS/BLUE SHIELD | Source: Ambulatory Visit

## 2015-09-29 ENCOUNTER — Other Ambulatory Visit: Payer: Self-pay | Admitting: Oncology

## 2015-09-29 DIAGNOSIS — N63 Unspecified lump in unspecified breast: Secondary | ICD-10-CM

## 2015-11-25 DIAGNOSIS — Z1389 Encounter for screening for other disorder: Secondary | ICD-10-CM | POA: Diagnosis not present

## 2015-11-25 DIAGNOSIS — F419 Anxiety disorder, unspecified: Secondary | ICD-10-CM | POA: Diagnosis not present

## 2015-11-25 DIAGNOSIS — Z6830 Body mass index (BMI) 30.0-30.9, adult: Secondary | ICD-10-CM | POA: Diagnosis not present

## 2015-11-25 DIAGNOSIS — E118 Type 2 diabetes mellitus with unspecified complications: Secondary | ICD-10-CM | POA: Diagnosis not present

## 2015-11-25 DIAGNOSIS — G894 Chronic pain syndrome: Secondary | ICD-10-CM | POA: Diagnosis not present

## 2015-12-01 DIAGNOSIS — F17200 Nicotine dependence, unspecified, uncomplicated: Secondary | ICD-10-CM | POA: Diagnosis not present

## 2015-12-16 DIAGNOSIS — Z713 Dietary counseling and surveillance: Secondary | ICD-10-CM | POA: Diagnosis not present

## 2015-12-16 DIAGNOSIS — I10 Essential (primary) hypertension: Secondary | ICD-10-CM | POA: Diagnosis not present

## 2015-12-16 DIAGNOSIS — E7800 Pure hypercholesterolemia, unspecified: Secondary | ICD-10-CM | POA: Diagnosis not present

## 2016-01-31 ENCOUNTER — Other Ambulatory Visit: Payer: Self-pay | Admitting: Cardiology

## 2016-02-28 DIAGNOSIS — E063 Autoimmune thyroiditis: Secondary | ICD-10-CM | POA: Diagnosis not present

## 2016-02-28 DIAGNOSIS — E119 Type 2 diabetes mellitus without complications: Secondary | ICD-10-CM | POA: Diagnosis not present

## 2016-02-28 DIAGNOSIS — E782 Mixed hyperlipidemia: Secondary | ICD-10-CM | POA: Diagnosis not present

## 2016-02-28 DIAGNOSIS — I10 Essential (primary) hypertension: Secondary | ICD-10-CM | POA: Diagnosis not present

## 2016-02-28 DIAGNOSIS — Z6830 Body mass index (BMI) 30.0-30.9, adult: Secondary | ICD-10-CM | POA: Diagnosis not present

## 2016-02-28 DIAGNOSIS — Z1389 Encounter for screening for other disorder: Secondary | ICD-10-CM | POA: Diagnosis not present

## 2016-03-01 DIAGNOSIS — I2510 Atherosclerotic heart disease of native coronary artery without angina pectoris: Secondary | ICD-10-CM | POA: Diagnosis not present

## 2016-03-01 DIAGNOSIS — Z720 Tobacco use: Secondary | ICD-10-CM | POA: Diagnosis not present

## 2016-03-01 DIAGNOSIS — E781 Pure hyperglyceridemia: Secondary | ICD-10-CM | POA: Diagnosis not present

## 2016-03-01 DIAGNOSIS — E119 Type 2 diabetes mellitus without complications: Secondary | ICD-10-CM | POA: Diagnosis not present

## 2016-04-06 ENCOUNTER — Encounter: Payer: Self-pay | Admitting: Cardiovascular Disease

## 2016-04-06 ENCOUNTER — Ambulatory Visit (INDEPENDENT_AMBULATORY_CARE_PROVIDER_SITE_OTHER): Payer: BLUE CROSS/BLUE SHIELD | Admitting: Cardiovascular Disease

## 2016-04-06 ENCOUNTER — Encounter: Payer: Self-pay | Admitting: *Deleted

## 2016-04-06 VITALS — BP 136/85 | HR 85 | Ht 66.0 in | Wt 184.0 lb

## 2016-04-06 DIAGNOSIS — I10 Essential (primary) hypertension: Secondary | ICD-10-CM | POA: Diagnosis not present

## 2016-04-06 DIAGNOSIS — I6523 Occlusion and stenosis of bilateral carotid arteries: Secondary | ICD-10-CM

## 2016-04-06 DIAGNOSIS — I25118 Atherosclerotic heart disease of native coronary artery with other forms of angina pectoris: Secondary | ICD-10-CM | POA: Diagnosis not present

## 2016-04-06 DIAGNOSIS — R0989 Other specified symptoms and signs involving the circulatory and respiratory systems: Secondary | ICD-10-CM | POA: Diagnosis not present

## 2016-04-06 DIAGNOSIS — Z95828 Presence of other vascular implants and grafts: Secondary | ICD-10-CM

## 2016-04-06 DIAGNOSIS — Z720 Tobacco use: Secondary | ICD-10-CM

## 2016-04-06 MED ORDER — NITROGLYCERIN 0.4 MG SL SUBL: 0.4000 mg | SUBLINGUAL_TABLET | SUBLINGUAL | 3 refills | Status: AC | PRN

## 2016-04-06 NOTE — Addendum Note (Signed)
Addended by: Laurine Blazer on: 04/06/2016 09:49 AM   Modules accepted: Orders

## 2016-04-06 NOTE — Patient Instructions (Signed)
Medication Instructions:  Continue all current medications.  Labwork: none  Testing/Procedures:  Your physician has requested that you have a carotid duplex. This test is an ultrasound of the carotid arteries in your neck. It looks at blood flow through these arteries that supply the brain with blood. Allow one hour for this exam. There are no restrictions or special instructions.  Your physician has requested that you have an abdominal aorta duplex. During this test, an ultrasound is used to evaluate the aorta. Allow 30 minutes for this exam. Do not eat after midnight the day before and avoid carbonated beverages  Your physician has requested that you have a renal artery duplex. During this test, an ultrasound is used to evaluate blood flow to the kidneys. Allow one hour for this exam. Do not eat after midnight the day before and avoid carbonated beverages. Take your medications as you usually do.  Office will contact with results via phone or letter.    Follow-Up: Your physician wants you to follow up in:  1 year.  You will receive a reminder letter in the mail one-two months in advance.  If you don't receive a letter, please call our office to schedule the follow up appointment   Any Other Special Instructions Will Be Listed Below (If Applicable). Work note provided today.   If you need a refill on your cardiac medications before your next appointment, please call your pharmacy.
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
## 2016-04-27 ENCOUNTER — Other Ambulatory Visit: Payer: BLUE CROSS/BLUE SHIELD

## 2016-05-08 DIAGNOSIS — H6121 Impacted cerumen, right ear: Secondary | ICD-10-CM | POA: Diagnosis not present

## 2016-05-13 ENCOUNTER — Other Ambulatory Visit: Payer: Self-pay | Admitting: Cardiology

## 2016-05-22 ENCOUNTER — Other Ambulatory Visit: Payer: Self-pay | Admitting: Cardiovascular Disease

## 2016-05-22 DIAGNOSIS — I10 Essential (primary) hypertension: Secondary | ICD-10-CM

## 2016-05-22 DIAGNOSIS — R0989 Other specified symptoms and signs involving the circulatory and respiratory systems: Secondary | ICD-10-CM

## 2016-05-31 ENCOUNTER — Other Ambulatory Visit: Payer: BLUE CROSS/BLUE SHIELD

## 2016-05-31 ENCOUNTER — Ambulatory Visit: Payer: BLUE CROSS/BLUE SHIELD

## 2016-05-31 DIAGNOSIS — R0989 Other specified symptoms and signs involving the circulatory and respiratory systems: Secondary | ICD-10-CM | POA: Diagnosis not present

## 2016-05-31 DIAGNOSIS — I6523 Occlusion and stenosis of bilateral carotid arteries: Secondary | ICD-10-CM

## 2016-05-31 DIAGNOSIS — I10 Essential (primary) hypertension: Secondary | ICD-10-CM | POA: Diagnosis not present

## 2016-06-01 DIAGNOSIS — E119 Type 2 diabetes mellitus without complications: Secondary | ICD-10-CM | POA: Diagnosis not present

## 2016-06-01 DIAGNOSIS — L84 Corns and callosities: Secondary | ICD-10-CM | POA: Diagnosis not present

## 2016-06-01 DIAGNOSIS — H9201 Otalgia, right ear: Secondary | ICD-10-CM | POA: Diagnosis not present

## 2016-06-01 DIAGNOSIS — E782 Mixed hyperlipidemia: Secondary | ICD-10-CM | POA: Diagnosis not present

## 2016-06-01 DIAGNOSIS — H6121 Impacted cerumen, right ear: Secondary | ICD-10-CM | POA: Diagnosis not present

## 2016-06-01 DIAGNOSIS — I779 Disorder of arteries and arterioles, unspecified: Secondary | ICD-10-CM | POA: Diagnosis not present

## 2016-06-01 DIAGNOSIS — Z6831 Body mass index (BMI) 31.0-31.9, adult: Secondary | ICD-10-CM | POA: Diagnosis not present

## 2016-06-01 DIAGNOSIS — Z1389 Encounter for screening for other disorder: Secondary | ICD-10-CM | POA: Diagnosis not present

## 2016-06-02 ENCOUNTER — Telehealth: Payer: Self-pay | Admitting: Cardiovascular Disease

## 2016-06-02 NOTE — Telephone Encounter (Signed)
Spoke with pt,said to talk with pcp

## 2016-06-02 NOTE — Telephone Encounter (Signed)
Patient called requesting to speak with [facility].  She wanted to know if anyone could tell her what the noise was that she was hearing in her stomach.

## 2016-06-05 DIAGNOSIS — Z008 Encounter for other general examination: Secondary | ICD-10-CM | POA: Diagnosis not present

## 2016-06-09 ENCOUNTER — Telehealth: Payer: Self-pay

## 2016-06-09 DIAGNOSIS — I739 Peripheral vascular disease, unspecified: Secondary | ICD-10-CM

## 2016-06-09 NOTE — Telephone Encounter (Signed)
-----   Message from Herminio Commons, MD sent at 06/09/2016  3:07 PM EST ----- Renal artery stenosis does not need intervention. She does have PVD. Would make referral to vascular surgery to obtain their opinion.

## 2016-06-09 NOTE — Telephone Encounter (Signed)
Ref placed to [facility] for consult on PVD

## 2016-06-21 ENCOUNTER — Telehealth: Payer: Self-pay | Admitting: Cardiovascular Disease

## 2016-06-21 NOTE — Telephone Encounter (Signed)
Patient was given results of carotid and renal artery dopplers again and was given an explanation of why she was being referred to a vascular doctor. Patient said she didn't know that she had PVD. Patient advised that her results showed stenosis in her carotid and renal arteries. Patient requested a copy of her results be mailed to her. Copies sent.

## 2016-06-21 NOTE — Telephone Encounter (Signed)
Patient is very concerned about getting a call from vascular doctor trying to make an appointment with her.  Stated that she was told by by the nurse that called her all was ok and results were good.   She said that she was never told she was being referred to a vascular doctor.  Would like to know what is going on.

## 2016-07-04 ENCOUNTER — Other Ambulatory Visit: Payer: Self-pay

## 2016-07-04 DIAGNOSIS — I739 Peripheral vascular disease, unspecified: Secondary | ICD-10-CM

## 2016-08-08 ENCOUNTER — Encounter: Payer: Self-pay | Admitting: Vascular Surgery

## 2016-08-11 ENCOUNTER — Ambulatory Visit (HOSPITAL_COMMUNITY): Payer: BLUE CROSS/BLUE SHIELD

## 2016-08-11 ENCOUNTER — Encounter: Payer: BLUE CROSS/BLUE SHIELD | Admitting: Vascular Surgery

## 2016-08-17 ENCOUNTER — Encounter (HOSPITAL_COMMUNITY): Payer: BLUE CROSS/BLUE SHIELD

## 2016-08-17 ENCOUNTER — Encounter: Payer: BLUE CROSS/BLUE SHIELD | Admitting: Vascular Surgery

## 2016-08-21 DIAGNOSIS — I2510 Atherosclerotic heart disease of native coronary artery without angina pectoris: Secondary | ICD-10-CM | POA: Diagnosis not present

## 2016-08-21 DIAGNOSIS — R0989 Other specified symptoms and signs involving the circulatory and respiratory systems: Secondary | ICD-10-CM | POA: Diagnosis not present

## 2016-08-21 DIAGNOSIS — Z720 Tobacco use: Secondary | ICD-10-CM | POA: Diagnosis not present

## 2016-08-21 DIAGNOSIS — E119 Type 2 diabetes mellitus without complications: Secondary | ICD-10-CM | POA: Diagnosis not present

## 2016-08-23 ENCOUNTER — Encounter (HOSPITAL_COMMUNITY): Payer: BLUE CROSS/BLUE SHIELD

## 2016-08-23 ENCOUNTER — Encounter: Payer: BLUE CROSS/BLUE SHIELD | Admitting: Vascular Surgery

## 2016-08-24 DIAGNOSIS — Z6831 Body mass index (BMI) 31.0-31.9, adult: Secondary | ICD-10-CM | POA: Diagnosis not present

## 2016-08-24 DIAGNOSIS — J329 Chronic sinusitis, unspecified: Secondary | ICD-10-CM | POA: Diagnosis not present

## 2016-08-24 DIAGNOSIS — H6693 Otitis media, unspecified, bilateral: Secondary | ICD-10-CM | POA: Diagnosis not present

## 2016-08-24 DIAGNOSIS — Z1389 Encounter for screening for other disorder: Secondary | ICD-10-CM | POA: Diagnosis not present

## 2016-08-24 DIAGNOSIS — I779 Disorder of arteries and arterioles, unspecified: Secondary | ICD-10-CM | POA: Diagnosis not present

## 2016-08-24 DIAGNOSIS — F17290 Nicotine dependence, other tobacco product, uncomplicated: Secondary | ICD-10-CM | POA: Diagnosis not present

## 2016-09-26 ENCOUNTER — Telehealth: Payer: Self-pay | Admitting: Vascular Surgery

## 2016-09-26 NOTE — Telephone Encounter (Signed)
Left message to give information regarding insurance benefits per request for patient. The specialist visit is subjected to a $60 copay and any labs she gets done here will be subjected to her $500 deductible and 20% coinsurance since the lab bills as a hospital outpatient facility. The patient should expect two bills for any ultrasounds completely in a facility (facility charges and physician charges). [phone] Spoke to [facility], ref# [phone]

## 2016-09-29 ENCOUNTER — Encounter: Payer: Self-pay | Admitting: Vascular Surgery

## 2016-10-11 ENCOUNTER — Ambulatory Visit (HOSPITAL_COMMUNITY): Admission: RE | Admit: 2016-10-11 | Payer: BLUE CROSS/BLUE SHIELD | Source: Ambulatory Visit

## 2016-10-11 ENCOUNTER — Encounter: Payer: BLUE CROSS/BLUE SHIELD | Admitting: Vascular Surgery

## 2016-11-01 DIAGNOSIS — Z720 Tobacco use: Secondary | ICD-10-CM | POA: Diagnosis not present

## 2016-11-01 DIAGNOSIS — E119 Type 2 diabetes mellitus without complications: Secondary | ICD-10-CM | POA: Diagnosis not present

## 2016-11-01 DIAGNOSIS — E781 Pure hyperglyceridemia: Secondary | ICD-10-CM | POA: Diagnosis not present

## 2016-11-01 DIAGNOSIS — R0989 Other specified symptoms and signs involving the circulatory and respiratory systems: Secondary | ICD-10-CM | POA: Diagnosis not present

## 2016-11-07 ENCOUNTER — Encounter: Payer: BLUE CROSS/BLUE SHIELD | Admitting: Cardiovascular Disease

## 2016-11-16 ENCOUNTER — Encounter: Payer: Self-pay | Admitting: Vascular Surgery

## 2016-11-16 DIAGNOSIS — F419 Anxiety disorder, unspecified: Secondary | ICD-10-CM | POA: Diagnosis not present

## 2016-11-16 DIAGNOSIS — M2550 Pain in unspecified joint: Secondary | ICD-10-CM | POA: Diagnosis not present

## 2016-11-16 DIAGNOSIS — I10 Essential (primary) hypertension: Secondary | ICD-10-CM | POA: Diagnosis not present

## 2016-11-16 DIAGNOSIS — E119 Type 2 diabetes mellitus without complications: Secondary | ICD-10-CM | POA: Diagnosis not present

## 2016-11-16 DIAGNOSIS — G894 Chronic pain syndrome: Secondary | ICD-10-CM | POA: Diagnosis not present

## 2016-11-16 DIAGNOSIS — Z1389 Encounter for screening for other disorder: Secondary | ICD-10-CM | POA: Diagnosis not present

## 2016-11-16 DIAGNOSIS — Z6830 Body mass index (BMI) 30.0-30.9, adult: Secondary | ICD-10-CM | POA: Diagnosis not present

## 2016-11-16 DIAGNOSIS — G43901 Migraine, unspecified, not intractable, with status migrainosus: Secondary | ICD-10-CM | POA: Diagnosis not present

## 2016-11-29 ENCOUNTER — Ambulatory Visit (INDEPENDENT_AMBULATORY_CARE_PROVIDER_SITE_OTHER): Payer: BLUE CROSS/BLUE SHIELD | Admitting: Vascular Surgery

## 2016-11-29 ENCOUNTER — Ambulatory Visit (HOSPITAL_COMMUNITY)
Admission: RE | Admit: 2016-11-29 | Discharge: 2016-11-29 | Disposition: A | Discharge status: Home / Self Care | Payer: BLUE CROSS/BLUE SHIELD | Source: Ambulatory Visit | Attending: Vascular Surgery | Admitting: Vascular Surgery

## 2016-11-29 ENCOUNTER — Encounter: Payer: Self-pay | Admitting: Vascular Surgery

## 2016-11-29 ENCOUNTER — Encounter: Payer: BLUE CROSS/BLUE SHIELD | Admitting: Vascular Surgery

## 2016-11-29 VITALS — BP 139/74 | HR 70 | Temp 97.8°F | Resp 18 | Ht 66.0 in | Wt 178.2 lb

## 2016-11-29 DIAGNOSIS — I739 Peripheral vascular disease, unspecified: Secondary | ICD-10-CM | POA: Insufficient documentation
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
## 2016-12-08 NOTE — Addendum Note (Signed)
Addended by: Lianne Cure A on: 12/08/2016 04:05 PM   Modules accepted: Orders

## 2016-12-27 DIAGNOSIS — E119 Type 2 diabetes mellitus without complications: Secondary | ICD-10-CM | POA: Diagnosis not present

## 2016-12-27 DIAGNOSIS — M779 Enthesopathy, unspecified: Secondary | ICD-10-CM | POA: Diagnosis not present

## 2016-12-27 DIAGNOSIS — F17200 Nicotine dependence, unspecified, uncomplicated: Secondary | ICD-10-CM | POA: Diagnosis not present

## 2016-12-27 DIAGNOSIS — Z1389 Encounter for screening for other disorder: Secondary | ICD-10-CM | POA: Diagnosis not present

## 2016-12-27 DIAGNOSIS — E6609 Other obesity due to excess calories: Secondary | ICD-10-CM | POA: Diagnosis not present

## 2016-12-27 DIAGNOSIS — Z6830 Body mass index (BMI) 30.0-30.9, adult: Secondary | ICD-10-CM | POA: Diagnosis not present

## 2017-01-29 DIAGNOSIS — M778 Other enthesopathies, not elsewhere classified: Secondary | ICD-10-CM | POA: Diagnosis not present

## 2017-01-29 DIAGNOSIS — I10 Essential (primary) hypertension: Secondary | ICD-10-CM | POA: Diagnosis not present

## 2017-02-19 DIAGNOSIS — E119 Type 2 diabetes mellitus without complications: Secondary | ICD-10-CM | POA: Diagnosis not present

## 2017-02-19 DIAGNOSIS — E063 Autoimmune thyroiditis: Secondary | ICD-10-CM | POA: Diagnosis not present

## 2017-02-19 DIAGNOSIS — M779 Enthesopathy, unspecified: Secondary | ICD-10-CM | POA: Diagnosis not present

## 2017-02-19 DIAGNOSIS — Z1389 Encounter for screening for other disorder: Secondary | ICD-10-CM | POA: Diagnosis not present

## 2017-02-19 DIAGNOSIS — I10 Essential (primary) hypertension: Secondary | ICD-10-CM | POA: Diagnosis not present

## 2017-02-19 DIAGNOSIS — Z6830 Body mass index (BMI) 30.0-30.9, adult: Secondary | ICD-10-CM | POA: Diagnosis not present

## 2017-02-19 DIAGNOSIS — F419 Anxiety disorder, unspecified: Secondary | ICD-10-CM | POA: Diagnosis not present

## 2017-02-19 DIAGNOSIS — E1165 Type 2 diabetes mellitus with hyperglycemia: Secondary | ICD-10-CM | POA: Diagnosis not present

## 2017-03-22 ENCOUNTER — Other Ambulatory Visit: Payer: Self-pay | Admitting: Internal Medicine

## 2017-03-22 DIAGNOSIS — Z1231 Encounter for screening mammogram for malignant neoplasm of breast: Secondary | ICD-10-CM

## 2017-04-10 ENCOUNTER — Ambulatory Visit
Admission: RE | Admit: 2017-04-10 | Discharge: 2017-04-10 | Disposition: A | Discharge status: Home / Self Care | Payer: BLUE CROSS/BLUE SHIELD | Source: Ambulatory Visit | Attending: Internal Medicine | Admitting: Internal Medicine

## 2017-04-10 DIAGNOSIS — Z1231 Encounter for screening mammogram for malignant neoplasm of breast: Secondary | ICD-10-CM | POA: Diagnosis not present

## 2017-05-12 ENCOUNTER — Other Ambulatory Visit: Payer: Self-pay | Admitting: Cardiovascular Disease

## 2017-05-21 DIAGNOSIS — Z6830 Body mass index (BMI) 30.0-30.9, adult: Secondary | ICD-10-CM | POA: Diagnosis not present

## 2017-05-21 DIAGNOSIS — E1165 Type 2 diabetes mellitus with hyperglycemia: Secondary | ICD-10-CM | POA: Diagnosis not present

## 2017-05-21 DIAGNOSIS — I2510 Atherosclerotic heart disease of native coronary artery without angina pectoris: Secondary | ICD-10-CM | POA: Diagnosis not present

## 2017-05-21 DIAGNOSIS — I10 Essential (primary) hypertension: Secondary | ICD-10-CM | POA: Diagnosis not present

## 2017-05-21 DIAGNOSIS — E782 Mixed hyperlipidemia: Secondary | ICD-10-CM | POA: Diagnosis not present

## 2017-05-21 DIAGNOSIS — Z1389 Encounter for screening for other disorder: Secondary | ICD-10-CM | POA: Diagnosis not present

## 2017-05-21 DIAGNOSIS — Z23 Encounter for immunization: Secondary | ICD-10-CM | POA: Diagnosis not present

## 2017-07-04 DIAGNOSIS — E119 Type 2 diabetes mellitus without complications: Secondary | ICD-10-CM | POA: Diagnosis not present

## 2017-07-04 DIAGNOSIS — I2510 Atherosclerotic heart disease of native coronary artery without angina pectoris: Secondary | ICD-10-CM | POA: Diagnosis not present

## 2017-07-04 DIAGNOSIS — E781 Pure hyperglyceridemia: Secondary | ICD-10-CM | POA: Diagnosis not present

## 2017-07-04 DIAGNOSIS — Z720 Tobacco use: Secondary | ICD-10-CM | POA: Diagnosis not present

## 2017-07-25 DIAGNOSIS — R062 Wheezing: Secondary | ICD-10-CM | POA: Diagnosis not present

## 2017-07-25 DIAGNOSIS — Z716 Tobacco abuse counseling: Secondary | ICD-10-CM | POA: Diagnosis not present

## 2017-07-25 DIAGNOSIS — J069 Acute upper respiratory infection, unspecified: Secondary | ICD-10-CM | POA: Diagnosis not present

## 2017-08-01 DIAGNOSIS — R062 Wheezing: Secondary | ICD-10-CM | POA: Diagnosis not present

## 2017-08-01 DIAGNOSIS — J069 Acute upper respiratory infection, unspecified: Secondary | ICD-10-CM | POA: Diagnosis not present

## 2017-08-01 DIAGNOSIS — Z720 Tobacco use: Secondary | ICD-10-CM | POA: Diagnosis not present

## 2017-08-01 DIAGNOSIS — Z716 Tobacco abuse counseling: Secondary | ICD-10-CM | POA: Diagnosis not present

## 2017-08-02 ENCOUNTER — Other Ambulatory Visit: Payer: Self-pay | Admitting: Cardiovascular Disease

## 2017-08-06 DIAGNOSIS — Z1389 Encounter for screening for other disorder: Secondary | ICD-10-CM | POA: Diagnosis not present

## 2017-08-06 DIAGNOSIS — E782 Mixed hyperlipidemia: Secondary | ICD-10-CM | POA: Diagnosis not present

## 2017-08-06 DIAGNOSIS — E118 Type 2 diabetes mellitus with unspecified complications: Secondary | ICD-10-CM | POA: Diagnosis not present

## 2017-08-06 DIAGNOSIS — G894 Chronic pain syndrome: Secondary | ICD-10-CM | POA: Diagnosis not present

## 2017-08-06 DIAGNOSIS — F419 Anxiety disorder, unspecified: Secondary | ICD-10-CM | POA: Diagnosis not present

## 2017-08-06 DIAGNOSIS — J329 Chronic sinusitis, unspecified: Secondary | ICD-10-CM | POA: Diagnosis not present

## 2017-08-06 DIAGNOSIS — Z6830 Body mass index (BMI) 30.0-30.9, adult: Secondary | ICD-10-CM | POA: Diagnosis not present

## 2017-09-05 DIAGNOSIS — Z716 Tobacco abuse counseling: Secondary | ICD-10-CM | POA: Diagnosis not present

## 2017-09-10 ENCOUNTER — Other Ambulatory Visit: Payer: Self-pay | Admitting: Cardiovascular Disease

## 2017-09-10 ENCOUNTER — Telehealth: Payer: Self-pay | Admitting: Cardiovascular Disease

## 2017-09-10 ENCOUNTER — Other Ambulatory Visit: Payer: Self-pay

## 2017-09-10 ENCOUNTER — Ambulatory Visit: Payer: BLUE CROSS/BLUE SHIELD | Admitting: Cardiovascular Disease

## 2017-09-10 ENCOUNTER — Encounter: Payer: Self-pay | Admitting: Cardiovascular Disease

## 2017-09-10 VITALS — BP 154/83 | HR 73 | Ht 64.0 in | Wt 178.0 lb

## 2017-09-10 DIAGNOSIS — I739 Peripheral vascular disease, unspecified: Secondary | ICD-10-CM | POA: Diagnosis not present

## 2017-09-10 DIAGNOSIS — I701 Atherosclerosis of renal artery: Secondary | ICD-10-CM | POA: Diagnosis not present

## 2017-09-10 DIAGNOSIS — Z95828 Presence of other vascular implants and grafts: Secondary | ICD-10-CM | POA: Diagnosis not present

## 2017-09-10 DIAGNOSIS — I10 Essential (primary) hypertension: Secondary | ICD-10-CM | POA: Diagnosis not present

## 2017-09-10 DIAGNOSIS — I25118 Atherosclerotic heart disease of native coronary artery with other forms of angina pectoris: Secondary | ICD-10-CM | POA: Diagnosis not present

## 2017-09-10 DIAGNOSIS — I6523 Occlusion and stenosis of bilateral carotid arteries: Secondary | ICD-10-CM

## 2017-09-10 NOTE — Patient Instructions (Addendum)
Medication Instructions:   Stop Plavix.   Continue all other medications.    Labwork: none  Testing/Procedures:  Your physician has requested that you have a carotid duplex. This test is an ultrasound of the carotid arteries in your neck. It looks at blood flow through these arteries that supply the brain with blood. Allow one hour for this exam. There are no restrictions or special instructions.  Office will contact with results via phone or letter.    Follow-Up: Your physician wants you to follow up in:  1 year.  You will receive a reminder letter in the mail one-two months in advance.  If you don't receive a letter, please call our office to schedule the follow up appointment   Any Other Special Instructions Will Be Listed Below (If Applicable).  If you need a refill on your cardiac medications before your next appointment, please call your pharmacy.

## 2017-09-10 NOTE — Telephone Encounter (Signed)
Carotid doppler - bilateral carotid artery stenosis  October 04, 2017 at [facility]
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
## 2017-09-12 ENCOUNTER — Encounter: Payer: Self-pay | Admitting: *Deleted

## 2017-09-18 ENCOUNTER — Other Ambulatory Visit: Payer: Self-pay

## 2017-09-18 MED ORDER — ATORVASTATIN CALCIUM 80 MG PO TABS: 80.0000 mg | ORAL_TABLET | Freq: Every day | ORAL | 1 refills | Status: DC

## 2017-09-18 MED ORDER — BENAZEPRIL HCL 10 MG PO TABS: 5.0000 mg | ORAL_TABLET | Freq: Every day | ORAL | 1 refills | Status: DC

## 2017-09-18 MED ORDER — METOPROLOL TARTRATE 25 MG PO TABS: 25.0000 mg | ORAL_TABLET | Freq: Two times a day (BID) | ORAL | 1 refills | Status: DC

## 2017-10-10 DIAGNOSIS — Z716 Tobacco abuse counseling: Secondary | ICD-10-CM | POA: Diagnosis not present

## 2017-11-01 DIAGNOSIS — I779 Disorder of arteries and arterioles, unspecified: Secondary | ICD-10-CM | POA: Diagnosis not present

## 2017-11-01 DIAGNOSIS — Z6830 Body mass index (BMI) 30.0-30.9, adult: Secondary | ICD-10-CM | POA: Diagnosis not present

## 2017-11-01 DIAGNOSIS — N390 Urinary tract infection, site not specified: Secondary | ICD-10-CM | POA: Diagnosis not present

## 2017-11-01 DIAGNOSIS — E1165 Type 2 diabetes mellitus with hyperglycemia: Secondary | ICD-10-CM | POA: Diagnosis not present

## 2017-11-01 DIAGNOSIS — Z1389 Encounter for screening for other disorder: Secondary | ICD-10-CM | POA: Diagnosis not present

## 2017-11-01 DIAGNOSIS — I10 Essential (primary) hypertension: Secondary | ICD-10-CM | POA: Diagnosis not present

## 2017-11-01 DIAGNOSIS — N029 Recurrent and persistent hematuria with unspecified morphologic changes: Secondary | ICD-10-CM | POA: Diagnosis not present

## 2017-11-01 DIAGNOSIS — R5383 Other fatigue: Secondary | ICD-10-CM | POA: Diagnosis not present

## 2017-11-01 DIAGNOSIS — F1120 Opioid dependence, uncomplicated: Secondary | ICD-10-CM | POA: Diagnosis not present

## 2017-11-01 DIAGNOSIS — E6609 Other obesity due to excess calories: Secondary | ICD-10-CM | POA: Diagnosis not present

## 2017-12-07 ENCOUNTER — Ambulatory Visit: Payer: BLUE CROSS/BLUE SHIELD | Admitting: Family

## 2017-12-07 ENCOUNTER — Encounter (HOSPITAL_COMMUNITY): Payer: BLUE CROSS/BLUE SHIELD

## 2018-02-01 DIAGNOSIS — Z1389 Encounter for screening for other disorder: Secondary | ICD-10-CM | POA: Diagnosis not present

## 2018-02-01 DIAGNOSIS — E118 Type 2 diabetes mellitus with unspecified complications: Secondary | ICD-10-CM | POA: Diagnosis not present

## 2018-02-01 DIAGNOSIS — Z0001 Encounter for general adult medical examination with abnormal findings: Secondary | ICD-10-CM | POA: Diagnosis not present

## 2018-02-01 DIAGNOSIS — Z6831 Body mass index (BMI) 31.0-31.9, adult: Secondary | ICD-10-CM | POA: Diagnosis not present

## 2018-02-01 DIAGNOSIS — I872 Venous insufficiency (chronic) (peripheral): Secondary | ICD-10-CM | POA: Diagnosis not present

## 2018-02-01 DIAGNOSIS — I6523 Occlusion and stenosis of bilateral carotid arteries: Secondary | ICD-10-CM | POA: Diagnosis not present

## 2018-02-06 DIAGNOSIS — R0989 Other specified symptoms and signs involving the circulatory and respiratory systems: Secondary | ICD-10-CM | POA: Diagnosis not present

## 2018-02-06 DIAGNOSIS — Z008 Encounter for other general examination: Secondary | ICD-10-CM | POA: Diagnosis not present

## 2018-02-06 DIAGNOSIS — Z720 Tobacco use: Secondary | ICD-10-CM | POA: Diagnosis not present

## 2018-02-06 DIAGNOSIS — E119 Type 2 diabetes mellitus without complications: Secondary | ICD-10-CM | POA: Diagnosis not present

## 2018-02-14 ENCOUNTER — Other Ambulatory Visit: Payer: Self-pay | Admitting: Cardiovascular Disease

## 2018-04-11 DIAGNOSIS — E6609 Other obesity due to excess calories: Secondary | ICD-10-CM | POA: Diagnosis not present

## 2018-04-11 DIAGNOSIS — Z6831 Body mass index (BMI) 31.0-31.9, adult: Secondary | ICD-10-CM | POA: Diagnosis not present

## 2018-04-11 DIAGNOSIS — E782 Mixed hyperlipidemia: Secondary | ICD-10-CM | POA: Diagnosis not present

## 2018-04-11 DIAGNOSIS — Z1389 Encounter for screening for other disorder: Secondary | ICD-10-CM | POA: Diagnosis not present

## 2018-05-06 DIAGNOSIS — Z1389 Encounter for screening for other disorder: Secondary | ICD-10-CM | POA: Diagnosis not present

## 2018-05-06 DIAGNOSIS — D485 Neoplasm of uncertain behavior of skin: Secondary | ICD-10-CM | POA: Diagnosis not present

## 2018-05-06 DIAGNOSIS — D72829 Elevated white blood cell count, unspecified: Secondary | ICD-10-CM | POA: Diagnosis not present

## 2018-05-06 DIAGNOSIS — I779 Disorder of arteries and arterioles, unspecified: Secondary | ICD-10-CM | POA: Diagnosis not present

## 2018-05-06 DIAGNOSIS — I10 Essential (primary) hypertension: Secondary | ICD-10-CM | POA: Diagnosis not present

## 2018-05-06 DIAGNOSIS — E119 Type 2 diabetes mellitus without complications: Secondary | ICD-10-CM | POA: Diagnosis not present

## 2018-05-06 DIAGNOSIS — Z6830 Body mass index (BMI) 30.0-30.9, adult: Secondary | ICD-10-CM | POA: Diagnosis not present

## 2018-05-09 ENCOUNTER — Encounter (INDEPENDENT_AMBULATORY_CARE_PROVIDER_SITE_OTHER): Payer: Self-pay | Admitting: *Deleted

## 2018-08-07 DIAGNOSIS — E119 Type 2 diabetes mellitus without complications: Secondary | ICD-10-CM | POA: Diagnosis not present

## 2018-08-07 DIAGNOSIS — E118 Type 2 diabetes mellitus with unspecified complications: Secondary | ICD-10-CM | POA: Diagnosis not present

## 2018-08-07 DIAGNOSIS — Z6830 Body mass index (BMI) 30.0-30.9, adult: Secondary | ICD-10-CM | POA: Diagnosis not present

## 2018-08-07 DIAGNOSIS — L84 Corns and callosities: Secondary | ICD-10-CM | POA: Diagnosis not present

## 2018-08-07 DIAGNOSIS — I10 Essential (primary) hypertension: Secondary | ICD-10-CM | POA: Diagnosis not present

## 2018-08-07 DIAGNOSIS — B070 Plantar wart: Secondary | ICD-10-CM | POA: Diagnosis not present

## 2018-08-07 DIAGNOSIS — E1165 Type 2 diabetes mellitus with hyperglycemia: Secondary | ICD-10-CM | POA: Diagnosis not present

## 2018-09-16 DIAGNOSIS — I779 Disorder of arteries and arterioles, unspecified: Secondary | ICD-10-CM | POA: Diagnosis not present

## 2018-09-16 DIAGNOSIS — Z6830 Body mass index (BMI) 30.0-30.9, adult: Secondary | ICD-10-CM | POA: Diagnosis not present

## 2018-09-16 DIAGNOSIS — I10 Essential (primary) hypertension: Secondary | ICD-10-CM | POA: Diagnosis not present

## 2018-09-16 DIAGNOSIS — T50905A Adverse effect of unspecified drugs, medicaments and biological substances, initial encounter: Secondary | ICD-10-CM | POA: Diagnosis not present

## 2018-09-16 DIAGNOSIS — E6609 Other obesity due to excess calories: Secondary | ICD-10-CM | POA: Diagnosis not present

## 2018-09-16 DIAGNOSIS — R197 Diarrhea, unspecified: Secondary | ICD-10-CM | POA: Diagnosis not present

## 2018-09-19 DIAGNOSIS — R197 Diarrhea, unspecified: Secondary | ICD-10-CM | POA: Diagnosis not present

## 2018-09-25 DIAGNOSIS — Z6830 Body mass index (BMI) 30.0-30.9, adult: Secondary | ICD-10-CM | POA: Diagnosis not present

## 2018-09-25 DIAGNOSIS — E6609 Other obesity due to excess calories: Secondary | ICD-10-CM | POA: Diagnosis not present

## 2018-09-25 DIAGNOSIS — K529 Noninfective gastroenteritis and colitis, unspecified: Secondary | ICD-10-CM | POA: Diagnosis not present

## 2018-09-30 ENCOUNTER — Encounter: Payer: Self-pay | Admitting: Internal Medicine

## 2018-10-01 ENCOUNTER — Encounter: Payer: Self-pay | Admitting: *Deleted

## 2018-10-01 ENCOUNTER — Other Ambulatory Visit: Payer: Self-pay | Admitting: *Deleted

## 2018-10-01 ENCOUNTER — Encounter: Payer: Self-pay | Admitting: Nurse Practitioner

## 2018-10-01 ENCOUNTER — Ambulatory Visit: Payer: BLUE CROSS/BLUE SHIELD | Admitting: Nurse Practitioner

## 2018-10-01 DIAGNOSIS — R197 Diarrhea, unspecified: Secondary | ICD-10-CM | POA: Diagnosis not present

## 2018-10-01 MED ORDER — CLENPIQ 10-3.5-12 MG-GM -GM/160ML PO SOLN: 1.0000 | Freq: Once | ORAL | 0 refills | Status: AC

## 2018-10-01 NOTE — Assessment & Plan Note (Addendum)
The patient complains of significant diarrhea for the past 2 months along with fecal incontinence.  No abdominal pain associated with her symptoms.  Anywhere from 6-12 stools a day.  She is tried Lomotil which is not helped.  Imodium seems to have helped somewhat although she did recently hold her metformin (remote history of diarrhea on starting metformin) and between the 2 of these her stools seem to be trying to improve.  Probiotics caused diarrhea and abdominal pain.  At this point she is due for colonoscopy so we will proceed with colonoscopy for diagnostic purposes with possible random colon biopsies.  I will check a GI pathogen panel, CBC, BMP.  Continue Imodium.  Pending stool study results, lab results, colonoscopy results we can consider CT of her abdomen/pelvis for further evaluation.  Follow-up in 4 months.  Proceed with TCS +/- colon biopsies on propofol/MAC with Dr. Gala Romney in near future: the risks, benefits, and alternatives have been discussed with the patient in detail. The patient states understanding and desires to proceed.  The patient is currently on Percocet, Xanax.  No other anticoagulants, anxiolytics, chronic pain medications, or antidepressants.  We will plan for the procedure on propofol/MAC to promote adequate sedation.

## 2018-10-01 NOTE — Patient Instructions (Signed)
Your health issues we discussed today were:   Diarrhea: 1. Collect your stool study and bring into the lab when you are able to.  You can have your blood test drawn when you drop off your stool sample 2. Continue taking Imodium as indicated on the label 3. We will schedule your colonoscopy for you 4. Further recommendations will be made based on the results of your colonoscopy 5. We may consider doing a CAT scan in the future depending on your work-up and results  Overall I recommend:  1. Return for follow-up in 4 months 2. Call us if you have any questions or concerns.  At [facility] we value your feedback. You may receive a survey about your visit today. Please share your experience as we strive to create trusting relationships with our patients to provide genuine, compassionate, quality care.  We appreciate your understanding and patience as we review any laboratory studies, imaging, and other diagnostic tests that are ordered as we care for you. Our office policy is 5 business days for review of these results, and any emergent or urgent results are addressed in a timely manner for your best interest. If you do not hear from our office in 1 week, please contact us.   We also encourage the use of MyChart, which contains your medical information for your review as well. If you are not enrolled in this feature, an access code is on this after visit summary for your convenience. Thank you for allowing [place] to be involved in your care.  It was great to see you today!  I hope you have a great day!!
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
## 2018-10-02 ENCOUNTER — Telehealth: Payer: Self-pay | Admitting: *Deleted

## 2018-10-02 NOTE — Progress Notes (Signed)
CC'D TO PCP °

## 2018-10-02 NOTE — Telephone Encounter (Signed)
Pre-op scheduled for 11/25/2018 at 10:00am. Letter mailed. Called pt, VM full.

## 2018-10-14 ENCOUNTER — Telehealth: Payer: Self-pay | Admitting: Internal Medicine

## 2018-10-14 DIAGNOSIS — R197 Diarrhea, unspecified: Secondary | ICD-10-CM | POA: Diagnosis not present

## 2018-10-14 NOTE — Telephone Encounter (Signed)
[phone] please call patient, she has some questions about her labs..said her work took labs last week and her white blood cells were elevated and she wants to know of eric wants to change the lab order to check for anything else.

## 2018-10-14 NOTE — Telephone Encounter (Signed)
EG, I spoke with pt. She had labs drawn by a nurse at her office due to the symptoms she was having. It wasn't the orders our office requested. She also said that nurse sent her home due to the diarrhea and elevated white blood cell count she was having. She had her labs requested from our office cbc and cmp drawn at [facility] lab today. Pt doesn't know if you need to add on labs. Pt is aware that EG will have to review labs first. She picked up her stool containers today as well and will turn them in to the lab.

## 2018-10-15 DIAGNOSIS — R197 Diarrhea, unspecified: Secondary | ICD-10-CM | POA: Diagnosis not present

## 2018-10-15 LAB — CBC WITH DIFFERENTIAL/PLATELET
ABSOLUTE MONOCYTES: 734 {cells}/uL (ref 200–950)
Basophils Absolute: 86 cells/uL (ref 0–200)
Basophils Relative: 0.6 %
Eosinophils Absolute: 202 cells/uL (ref 15–500)
Eosinophils Relative: 1.4 %
HEMATOCRIT: 40 % (ref 35.0–45.0)
HEMOGLOBIN: 13.5 g/dL (ref 11.7–15.5)
LYMPHS ABS: 3845 {cells}/uL (ref 850–3900)
MCH: 31.1 pg (ref 27.0–33.0)
MCHC: 33.8 g/dL (ref 32.0–36.0)
MCV: 92.2 fL (ref 80.0–100.0)
MPV: 11.8 fL (ref 7.5–12.5)
Monocytes Relative: 5.1 %
NEUTROS ABS: 9533 {cells}/uL — AB (ref 1500–7800)
NEUTROS PCT: 66.2 %
Platelets: 299 10*3/uL (ref 140–400)
RBC: 4.34 10*6/uL (ref 3.80–5.10)
RDW: 14 % (ref 11.0–15.0)
Total Lymphocyte: 26.7 %
WBC: 14.4 10*3/uL — AB (ref 3.8–10.8)

## 2018-10-15 LAB — BASIC METABOLIC PANEL
BUN: 9 mg/dL (ref 7–25)
CHLORIDE: 103 mmol/L (ref 98–110)
CO2: 27 mmol/L (ref 20–32)
CREATININE: 0.61 mg/dL (ref 0.50–1.05)
Calcium: 9.8 mg/dL (ref 8.6–10.4)
Glucose, Bld: 185 mg/dL — ABNORMAL HIGH (ref 65–99)
Potassium: 4.7 mmol/L (ref 3.5–5.3)
SODIUM: 141 mmol/L (ref 135–146)

## 2018-10-15 NOTE — Telephone Encounter (Signed)
Pt notified of EG's recommendations.  

## 2018-10-15 NOTE — Telephone Encounter (Signed)
Tried calling pt, no VM is set up. Will call pt back.

## 2018-10-15 NOTE — Telephone Encounter (Signed)
I looked at the labs ordered by the other provider. The WBC count is elevated, but looking back at 3 and 6 years it appears to always be somewhat elevated. I don't have much other baseline to compare to besides 3 years ago. There's nothing else I want to add right now, other than getting the stool study results.  Further recommendations to follow those results.

## 2018-10-18 LAB — GASTROINTESTINAL PATHOGEN PANEL PCR
C. difficile Tox A/B, PCR: NOT DETECTED
CAMPYLOBACTER, PCR: NOT DETECTED
Cryptosporidium, PCR: NOT DETECTED
E COLI (STEC) STX1/STX2, PCR: NOT DETECTED
E coli (ETEC) LT/ST PCR: NOT DETECTED
E coli 0157, PCR: NOT DETECTED
Giardia lamblia, PCR: NOT DETECTED
NOROVIRUS, PCR: NOT DETECTED
Rotavirus A, PCR: NOT DETECTED
SALMONELLA, PCR: NOT DETECTED
SHIGELLA, PCR: NOT DETECTED

## 2018-11-18 ENCOUNTER — Telehealth: Payer: Self-pay | Admitting: *Deleted

## 2018-11-18 NOTE — Telephone Encounter (Signed)
Called endo and LMOVM to cancel. FYI to EG

## 2018-11-18 NOTE — Telephone Encounter (Signed)
Pt called in and would like to cancel TCS w/ propofol scheduled with RMR on 12/02/2018.  Pt said that she would call back when she is ready to reschedule.  Pt did not want to schedule a few months out because she said that she was unsure of COVID 19 and did not feel comfortable.

## 2018-11-19 NOTE — Telephone Encounter (Signed)
Noted  

## 2018-11-25 ENCOUNTER — Other Ambulatory Visit (HOSPITAL_COMMUNITY): Payer: BLUE CROSS/BLUE SHIELD

## 2018-12-02 ENCOUNTER — Ambulatory Visit (HOSPITAL_COMMUNITY): Admit: 2018-12-02 | Payer: BLUE CROSS/BLUE SHIELD | Admitting: Internal Medicine

## 2018-12-02 ENCOUNTER — Encounter (HOSPITAL_COMMUNITY): Payer: Self-pay

## 2018-12-02 SURGERY — COLONOSCOPY WITH PROPOFOL
Anesthesia: Monitor Anesthesia Care

## 2018-12-04 ENCOUNTER — Ambulatory Visit: Payer: BLUE CROSS/BLUE SHIELD | Admitting: Nurse Practitioner

## 2018-12-22 ENCOUNTER — Other Ambulatory Visit: Payer: Self-pay | Admitting: Cardiovascular Disease

## 2019-01-06 DIAGNOSIS — I2510 Atherosclerotic heart disease of native coronary artery without angina pectoris: Secondary | ICD-10-CM | POA: Diagnosis not present

## 2019-01-06 DIAGNOSIS — Z6831 Body mass index (BMI) 31.0-31.9, adult: Secondary | ICD-10-CM | POA: Diagnosis not present

## 2019-01-06 DIAGNOSIS — Z720 Tobacco use: Secondary | ICD-10-CM | POA: Diagnosis not present

## 2019-01-06 DIAGNOSIS — Z008 Encounter for other general examination: Secondary | ICD-10-CM | POA: Diagnosis not present

## 2019-02-03 ENCOUNTER — Ambulatory Visit: Payer: BLUE CROSS/BLUE SHIELD | Admitting: Nurse Practitioner

## 2019-02-10 DIAGNOSIS — G894 Chronic pain syndrome: Secondary | ICD-10-CM | POA: Diagnosis not present

## 2019-02-10 DIAGNOSIS — E1165 Type 2 diabetes mellitus with hyperglycemia: Secondary | ICD-10-CM | POA: Diagnosis not present

## 2019-02-10 DIAGNOSIS — Z6831 Body mass index (BMI) 31.0-31.9, adult: Secondary | ICD-10-CM | POA: Diagnosis not present

## 2019-02-10 DIAGNOSIS — F419 Anxiety disorder, unspecified: Secondary | ICD-10-CM | POA: Diagnosis not present

## 2019-02-10 DIAGNOSIS — F329 Major depressive disorder, single episode, unspecified: Secondary | ICD-10-CM | POA: Diagnosis not present

## 2019-02-10 DIAGNOSIS — E782 Mixed hyperlipidemia: Secondary | ICD-10-CM | POA: Diagnosis not present

## 2019-02-10 DIAGNOSIS — E6609 Other obesity due to excess calories: Secondary | ICD-10-CM | POA: Diagnosis not present

## 2019-02-10 DIAGNOSIS — Z1389 Encounter for screening for other disorder: Secondary | ICD-10-CM | POA: Diagnosis not present

## 2019-02-10 DIAGNOSIS — Z0001 Encounter for general adult medical examination with abnormal findings: Secondary | ICD-10-CM | POA: Diagnosis not present

## 2019-02-11 ENCOUNTER — Other Ambulatory Visit (HOSPITAL_COMMUNITY): Payer: Self-pay | Admitting: Internal Medicine

## 2019-02-11 ENCOUNTER — Other Ambulatory Visit: Payer: Self-pay | Admitting: Internal Medicine

## 2019-02-11 DIAGNOSIS — I779 Disorder of arteries and arterioles, unspecified: Secondary | ICD-10-CM

## 2019-03-13 ENCOUNTER — Other Ambulatory Visit: Payer: Self-pay

## 2019-03-13 DIAGNOSIS — Z20822 Contact with and (suspected) exposure to covid-19: Secondary | ICD-10-CM

## 2019-03-15 LAB — NOVEL CORONAVIRUS, NAA: SARS-CoV-2, NAA: NOT DETECTED

## 2019-03-17 ENCOUNTER — Other Ambulatory Visit: Payer: Self-pay | Admitting: Cardiovascular Disease

## 2019-04-02 ENCOUNTER — Telehealth: Payer: Self-pay | Admitting: Cardiovascular Disease

## 2019-04-02 ENCOUNTER — Other Ambulatory Visit: Payer: Self-pay | Admitting: *Deleted

## 2019-04-02 ENCOUNTER — Telehealth: Payer: Self-pay | Admitting: *Deleted

## 2019-04-02 DIAGNOSIS — I25118 Atherosclerotic heart disease of native coronary artery with other forms of angina pectoris: Secondary | ICD-10-CM

## 2019-04-02 DIAGNOSIS — I2510 Atherosclerotic heart disease of native coronary artery without angina pectoris: Secondary | ICD-10-CM | POA: Diagnosis not present

## 2019-04-02 DIAGNOSIS — Z008 Encounter for other general examination: Secondary | ICD-10-CM | POA: Diagnosis not present

## 2019-04-02 DIAGNOSIS — I6523 Occlusion and stenosis of bilateral carotid arteries: Secondary | ICD-10-CM

## 2019-04-02 DIAGNOSIS — Z720 Tobacco use: Secondary | ICD-10-CM | POA: Diagnosis not present

## 2019-04-02 DIAGNOSIS — E119 Type 2 diabetes mellitus without complications: Secondary | ICD-10-CM | POA: Diagnosis not present

## 2019-04-02 NOTE — Telephone Encounter (Signed)
Pt aware of appts.

## 2019-04-02 NOTE — Telephone Encounter (Signed)
°  Precert needed for: Carotid  Location: [facility]    Date: May 15, 2019

## 2019-04-02 NOTE — Telephone Encounter (Signed)
LM for pt to call back scheduled for carotid and office appt 10/22 as requested

## 2019-04-02 NOTE — Telephone Encounter (Signed)
Pt is past due for f/u and wanting to know if Dr Bronson Ing will order carotid US so that she could have f/u appt and test done on same day

## 2019-04-02 NOTE — Telephone Encounter (Signed)
That would be fine 

## 2019-04-04 ENCOUNTER — Other Ambulatory Visit: Payer: Self-pay | Admitting: Internal Medicine

## 2019-04-04 DIAGNOSIS — Z1231 Encounter for screening mammogram for malignant neoplasm of breast: Secondary | ICD-10-CM

## 2019-04-07 ENCOUNTER — Telehealth: Payer: Self-pay | Admitting: Internal Medicine

## 2019-04-07 NOTE — Telephone Encounter (Signed)
PATIENT CALLED AND SAID THAT SHE PUT OFF HAVING HER PROCEDURE BUT WOULD LIKE TO SCHEDULE IT NOW, DOES SHE NEED ANOTHER OFFICE VISIT?

## 2019-04-07 NOTE — Telephone Encounter (Signed)
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 She wants to reschedule TCS w/Propofol. Will she need another OV to reschedule?

## 2019-04-07 NOTE — Telephone Encounter (Signed)
Pt is diabetic, no morning procedures available at this time. Will call pt when December schedule is available. Called and informed pt.

## 2019-04-07 NOTE — Telephone Encounter (Signed)
Lets do a good triage; that should suffice.

## 2019-04-10 ENCOUNTER — Telehealth: Payer: Self-pay | Admitting: Internal Medicine

## 2019-04-10 ENCOUNTER — Other Ambulatory Visit: Payer: Self-pay | Admitting: *Deleted

## 2019-04-10 ENCOUNTER — Encounter: Payer: Self-pay | Admitting: *Deleted

## 2019-04-10 DIAGNOSIS — R197 Diarrhea, unspecified: Secondary | ICD-10-CM

## 2019-04-10 MED ORDER — CLENPIQ 10-3.5-12 MG-GM -GM/160ML PO SOLN: 1.0000 | Freq: Once | ORAL | 0 refills | Status: AC

## 2019-04-10 NOTE — Telephone Encounter (Signed)
See prior note

## 2019-04-10 NOTE — Telephone Encounter (Signed)
Spoke with patient. She is rescheduled for procedure 12/7 at 8:15am. Patient needs new Rx sent to pharmacy. She is aware will mail prep instructions with pre-op/covid-19 testing appt. Coupon for clenpiq also mailed. Confirmed mailing address. Patient states she has not started any new medications, no changes in medical status.

## 2019-04-10 NOTE — Telephone Encounter (Signed)
LMOVM see prior note 

## 2019-04-10 NOTE — Telephone Encounter (Signed)
Pt was returning a call. [phone]

## 2019-04-10 NOTE — Telephone Encounter (Signed)
Pt was returning a call. Please call her work number. [phone]

## 2019-04-10 NOTE — Telephone Encounter (Signed)
Called pt to schedule, VM full.

## 2019-05-08 DIAGNOSIS — E119 Type 2 diabetes mellitus without complications: Secondary | ICD-10-CM | POA: Diagnosis not present

## 2019-05-08 DIAGNOSIS — H524 Presbyopia: Secondary | ICD-10-CM | POA: Diagnosis not present

## 2019-05-08 DIAGNOSIS — H40013 Open angle with borderline findings, low risk, bilateral: Secondary | ICD-10-CM | POA: Diagnosis not present

## 2019-05-14 ENCOUNTER — Ambulatory Visit
Admission: RE | Admit: 2019-05-14 | Discharge: 2019-05-14 | Disposition: A | Discharge status: Home / Self Care | Payer: BC Managed Care – PPO | Source: Ambulatory Visit | Attending: Internal Medicine | Admitting: Internal Medicine

## 2019-05-14 ENCOUNTER — Other Ambulatory Visit: Payer: Self-pay

## 2019-05-14 DIAGNOSIS — Z1231 Encounter for screening mammogram for malignant neoplasm of breast: Secondary | ICD-10-CM

## 2019-05-15 ENCOUNTER — Encounter: Payer: Self-pay | Admitting: Cardiovascular Disease

## 2019-05-15 ENCOUNTER — Other Ambulatory Visit: Payer: Self-pay | Admitting: Cardiovascular Disease

## 2019-05-15 ENCOUNTER — Ambulatory Visit (INDEPENDENT_AMBULATORY_CARE_PROVIDER_SITE_OTHER): Payer: BC Managed Care – PPO

## 2019-05-15 ENCOUNTER — Ambulatory Visit: Payer: BC Managed Care – PPO | Admitting: Cardiovascular Disease

## 2019-05-15 ENCOUNTER — Ambulatory Visit (INDEPENDENT_AMBULATORY_CARE_PROVIDER_SITE_OTHER): Payer: BC Managed Care – PPO | Admitting: Cardiovascular Disease

## 2019-05-15 ENCOUNTER — Other Ambulatory Visit: Payer: Self-pay

## 2019-05-15 VITALS — BP 138/82 | HR 76 | Ht 65.0 in | Wt 182.8 lb

## 2019-05-15 DIAGNOSIS — I25118 Atherosclerotic heart disease of native coronary artery with other forms of angina pectoris: Secondary | ICD-10-CM

## 2019-05-15 DIAGNOSIS — I6523 Occlusion and stenosis of bilateral carotid arteries: Secondary | ICD-10-CM

## 2019-05-15 DIAGNOSIS — Z95828 Presence of other vascular implants and grafts: Secondary | ICD-10-CM | POA: Diagnosis not present

## 2019-05-15 DIAGNOSIS — I739 Peripheral vascular disease, unspecified: Secondary | ICD-10-CM | POA: Diagnosis not present

## 2019-05-15 DIAGNOSIS — I701 Atherosclerosis of renal artery: Secondary | ICD-10-CM

## 2019-05-15 DIAGNOSIS — Z720 Tobacco use: Secondary | ICD-10-CM

## 2019-05-15 DIAGNOSIS — I10 Essential (primary) hypertension: Secondary | ICD-10-CM

## 2019-05-15 NOTE — Patient Instructions (Signed)
Your physician wants you to follow-up in: 1 YEAR WITH DR KONESWARAN You will receive a reminder letter in the mail two months in advance. If you don't receive a letter, please call our office to schedule the follow-up appointment.  Your physician recommends that you continue on your current medications as directed. Please refer to the Current Medication list given to you today.  Thank you for choosing  [facility]!!    
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
## 2019-05-16 ENCOUNTER — Telehealth: Payer: Self-pay | Admitting: *Deleted

## 2019-05-16 DIAGNOSIS — I6523 Occlusion and stenosis of bilateral carotid arteries: Secondary | ICD-10-CM

## 2019-05-16 NOTE — Telephone Encounter (Signed)
Pt has multiple questions regarding percentages and why she need referral to vascular surgery - requested to speak to Dr Bronson Ing as I could not answer to the specifics that she needed - aware that this may not be addressed until Monday

## 2019-05-16 NOTE — Telephone Encounter (Signed)
-----   Message from Herminio Commons, MD sent at 05/15/2019  1:26 PM EDT ----- Significant blockage in the left internal carotid artery.  Please refer to vascular surgery for ongoing surveillance and eventual intervention.

## 2019-05-17 NOTE — Telephone Encounter (Signed)
While not at the point of needing surgery, this will need continued monitoring with ultrasound. As vascular surgery would eventually be performing any necessary intervention, it is best she follow up with them for this.

## 2019-05-19 NOTE — Addendum Note (Signed)
Addended by: Julian Hy T on: 05/19/2019 04:22 PM   Modules accepted: Orders

## 2019-05-19 NOTE — Telephone Encounter (Signed)
Pt wants to know what has changed from the last carotid US that she now needs referral to vascular surgery

## 2019-05-19 NOTE — Telephone Encounter (Signed)
Pt voiced understanding - wants [place] to go ahead with referral - will forward to schedulers

## 2019-05-19 NOTE — Telephone Encounter (Signed)
Overall, the lesion is moderately obstructive but stable. If she prefers, can just repeat study in 1 year with me.

## 2019-05-28 ENCOUNTER — Other Ambulatory Visit: Payer: Self-pay | Admitting: Cardiovascular Disease

## 2019-05-28 DIAGNOSIS — Z6830 Body mass index (BMI) 30.0-30.9, adult: Secondary | ICD-10-CM | POA: Diagnosis not present

## 2019-05-28 DIAGNOSIS — E6609 Other obesity due to excess calories: Secondary | ICD-10-CM | POA: Diagnosis not present

## 2019-05-28 DIAGNOSIS — Z23 Encounter for immunization: Secondary | ICD-10-CM | POA: Diagnosis not present

## 2019-05-28 MED ORDER — ATORVASTATIN CALCIUM 80 MG PO TABS: 80.0000 mg | ORAL_TABLET | Freq: Every day | ORAL | 2 refills | Status: DC

## 2019-05-28 MED ORDER — METOPROLOL TARTRATE 25 MG PO TABS: 25.0000 mg | ORAL_TABLET | Freq: Two times a day (BID) | ORAL | 2 refills | Status: DC

## 2019-05-28 NOTE — Telephone Encounter (Signed)
°*  STAT* If patient is at the pharmacy, call can be transferred to refill team.   1. Which medications need to be refilled?  atorvastatin (LIPITOR) 80 MG tablet metoprolol tartrate (LOPRESSOR) 25 MG tablet     2. Which pharmacy/location (including street and city if local pharmacy) is medication to be sent to?[facility]  3. Do they need a 30 day or 90 day supply?

## 2019-06-04 DIAGNOSIS — E781 Pure hyperglyceridemia: Secondary | ICD-10-CM | POA: Diagnosis not present

## 2019-06-04 DIAGNOSIS — Z720 Tobacco use: Secondary | ICD-10-CM | POA: Diagnosis not present

## 2019-06-04 DIAGNOSIS — Z716 Tobacco abuse counseling: Secondary | ICD-10-CM | POA: Diagnosis not present

## 2019-06-04 DIAGNOSIS — E119 Type 2 diabetes mellitus without complications: Secondary | ICD-10-CM | POA: Diagnosis not present

## 2019-06-04 DIAGNOSIS — I2510 Atherosclerotic heart disease of native coronary artery without angina pectoris: Secondary | ICD-10-CM | POA: Diagnosis not present

## 2019-06-04 DIAGNOSIS — Z008 Encounter for other general examination: Secondary | ICD-10-CM | POA: Diagnosis not present

## 2019-06-05 DIAGNOSIS — H40013 Open angle with borderline findings, low risk, bilateral: Secondary | ICD-10-CM | POA: Diagnosis not present

## 2019-06-10 ENCOUNTER — Other Ambulatory Visit: Payer: Self-pay

## 2019-06-10 DIAGNOSIS — Z20822 Contact with and (suspected) exposure to covid-19: Secondary | ICD-10-CM

## 2019-06-11 DIAGNOSIS — G43119 Migraine with aura, intractable, without status migrainosus: Secondary | ICD-10-CM | POA: Diagnosis not present

## 2019-06-11 DIAGNOSIS — G43719 Chronic migraine without aura, intractable, without status migrainosus: Secondary | ICD-10-CM | POA: Diagnosis not present

## 2019-06-11 DIAGNOSIS — Z049 Encounter for examination and observation for unspecified reason: Secondary | ICD-10-CM | POA: Diagnosis not present

## 2019-06-12 LAB — NOVEL CORONAVIRUS, NAA: SARS-CoV-2, NAA: NOT DETECTED

## 2019-06-13 ENCOUNTER — Other Ambulatory Visit: Payer: Self-pay | Admitting: Specialist

## 2019-06-13 DIAGNOSIS — R519 Headache, unspecified: Secondary | ICD-10-CM

## 2019-06-18 DIAGNOSIS — L821 Other seborrheic keratosis: Secondary | ICD-10-CM | POA: Diagnosis not present

## 2019-06-18 DIAGNOSIS — L732 Hidradenitis suppurativa: Secondary | ICD-10-CM | POA: Diagnosis not present

## 2019-06-23 ENCOUNTER — Telehealth: Payer: Self-pay | Admitting: Internal Medicine

## 2019-06-23 ENCOUNTER — Other Ambulatory Visit: Payer: Self-pay

## 2019-06-23 ENCOUNTER — Telehealth: Payer: Self-pay

## 2019-06-23 NOTE — Telephone Encounter (Signed)
[phone]  Patient needs her prep instructions emailed to her [email or web address]

## 2019-06-23 NOTE — Telephone Encounter (Signed)
[name] at pre-service center called office and requested return call. [phone] ext [phone].  PA for TCS scheduled for 06/30/19 submitted via [facility] website. Case approved. PA# [id number], valid 06/30/19-09/28/19.

## 2019-06-23 NOTE — Telephone Encounter (Signed)
Confirmed email address. Patient aware instructions sent.

## 2019-06-25 NOTE — Patient Instructions (Signed)
[name]  06/25/2019     @[email or web address] @   Your procedure is scheduled on  06/30/2019  Report to [facility] at  0700  A.M.  Call this number if you have problems the morning of surgery:  [phone]   Remember:  Follow the diet and prep instructions given to you by Dr Roseanne Kaufman office.                      Take these medicines the morning of surgery with A SIP OF WATER  Baclofen(if needed), metoprolol.    Do not wear jewelry, make-up or nail polish.  Do not wear lotions, powders, or perfumes. Please wear deodorant and brush your teeth.  Do not shave 48 hours prior to surgery.  Men may shave face and neck.  Do not bring valuables to the hospital.  [facility] is not responsible for any belongings or valuables.  Contacts, dentures or bridgework may not be worn into surgery.  Leave your suitcase in the car.  After surgery it may be brought to your room.  For patients admitted to the hospital, discharge time will be determined by your treatment team.  Patients discharged the day of surgery will not be allowed to drive home.   Name and phone number of your driver:   family Special instructions:  None  Please read over the following fact sheets that you were given. Anesthesia Post-op Instructions and Care and Recovery After Surgery       Colonoscopy, Adult, Care After This sheet gives you information about how to care for yourself after your procedure. Your health care provider may also give you more specific instructions. If you have problems or questions, contact your health care provider. What can I expect after the procedure? After the procedure, it is common to have:  A small amount of blood in your stool for 24 hours after the procedure.  Some gas.  Mild abdominal cramping or bloating. Follow these instructions at home: General instructions  For the first 24 hours after the procedure: ? Do not drive or use machinery. ? Do not sign important  documents. ? Do not drink alcohol. ? Do your regular daily activities at a slower pace than normal. ? Eat soft, easy-to-digest foods.  Take over-the-counter or prescription medicines only as told by your health care provider. Relieving cramping and bloating   Try walking around when you have cramps or feel bloated.  Apply heat to your abdomen as told by your health care provider. Use a heat source that your health care provider recommends, such as a moist heat pack or a heating pad. ? Place a towel between your skin and the heat source. ? Leave the heat on for 20-30 minutes. ? Remove the heat if your skin turns bright red. This is especially important if you are unable to feel pain, heat, or cold. You may have a greater risk of getting burned. Eating and drinking   Drink enough fluid to keep your urine pale yellow.  Resume your normal diet as instructed by your health care provider. Avoid heavy or fried foods that are hard to digest.  Avoid drinking alcohol for as long as instructed by your health care provider. Contact a health care provider if:  You have blood in your stool 2-3 days after the procedure. Get help right away if:  You have more than a small spotting of blood in your stool.  You pass  large blood clots in your stool.  Your abdomen is swollen.  You have nausea or vomiting.  You have a fever.  You have increasing abdominal pain that is not relieved with medicine. Summary  After the procedure, it is common to have a small amount of blood in your stool. You may also have mild abdominal cramping and bloating.  For the first 24 hours after the procedure, do not drive or use machinery, sign important documents, or drink alcohol.  Contact your health care provider if you have a lot of blood in your stool, nausea or vomiting, a fever, or increased abdominal pain. This information is not intended to replace advice given to you by your health care provider. Make sure  you discuss any questions you have with your health care provider. Document Released: 02/22/2004 Document Revised: 05/02/2017 Document Reviewed: 09/21/2015 Elsevier Patient Education  2020 [employer] After These instructions provide you with information about caring for yourself after your procedure. Your health care provider may also give you more specific instructions. Your treatment has been planned according to current medical practices, but problems sometimes occur. Call your health care provider if you have any problems or questions after your procedure. What can I expect after the procedure? After your procedure, you may:  Feel sleepy for several hours.  Feel clumsy and have poor balance for several hours.  Feel forgetful about what happened after the procedure.  Have poor judgment for several hours.  Feel nauseous or vomit.  Have a sore throat if you had a breathing tube during the procedure. Follow these instructions at home: For at least 24 hours after the procedure:      Have a responsible adult stay with you. It is important to have someone help care for you until you are awake and alert.  Rest as needed.  Do not: ? Participate in activities in which you could fall or become injured. ? Drive. ? Use heavy machinery. ? Drink alcohol. ? Take sleeping pills or medicines that cause drowsiness. ? Make important decisions or sign legal documents. ? Take care of children on your own. Eating and drinking  Follow the diet that is recommended by your health care provider.  If you vomit, drink water, juice, or soup when you can drink without vomiting.  Make sure you have little or no nausea before eating solid foods. General instructions  Take over-the-counter and prescription medicines only as told by your health care provider.  If you have sleep apnea, surgery and certain medicines can increase your risk for breathing problems.  Follow instructions from your health care provider about wearing your sleep device: ? Anytime you are sleeping, including during daytime naps. ? While taking prescription pain medicines, sleeping medicines, or medicines that make you drowsy.  If you smoke, do not smoke without supervision.  Keep all follow-up visits as told by your health care provider. This is important. Contact a health care provider if:  You keep feeling nauseous or you keep vomiting.  You feel light-headed.  You develop a rash.  You have a fever. Get help right away if:  You have trouble breathing. Summary  For several hours after your procedure, you may feel sleepy and have poor judgment.  Have a responsible adult stay with you for at least 24 hours or until you are awake and alert. This information is not intended to replace advice given to you by your health care provider. Make sure you discuss any  questions you have with your health care provider. Document Released: 10/31/2015 Document Revised: 10/08/2017 Document Reviewed: 10/31/2015 Elsevier Patient Education  2020 [employer].

## 2019-06-27 ENCOUNTER — Other Ambulatory Visit (HOSPITAL_COMMUNITY)
Admission: RE | Admit: 2019-06-27 | Discharge: 2019-06-27 | Disposition: A | Discharge status: Home / Self Care | Payer: BC Managed Care – PPO | Source: Ambulatory Visit | Attending: Internal Medicine | Admitting: Internal Medicine

## 2019-06-27 ENCOUNTER — Encounter (HOSPITAL_COMMUNITY): Payer: Self-pay

## 2019-06-27 ENCOUNTER — Encounter (HOSPITAL_COMMUNITY)
Admission: RE | Admit: 2019-06-27 | Discharge: 2019-06-27 | Disposition: A | Discharge status: Home / Self Care | Payer: BC Managed Care – PPO | Source: Ambulatory Visit | Attending: Internal Medicine | Admitting: Internal Medicine

## 2019-06-27 ENCOUNTER — Other Ambulatory Visit: Payer: Self-pay

## 2019-06-27 DIAGNOSIS — Z01810 Encounter for preprocedural cardiovascular examination: Secondary | ICD-10-CM | POA: Diagnosis not present

## 2019-06-27 DIAGNOSIS — Z20828 Contact with and (suspected) exposure to other viral communicable diseases: Secondary | ICD-10-CM | POA: Diagnosis not present

## 2019-06-27 HISTORY — DX: Acute myocardial infarction, unspecified: I21.9

## 2019-06-27 HISTORY — DX: Depression, unspecified: F32.A

## 2019-06-27 HISTORY — DX: Migraine, unspecified, not intractable, without status migrainosus: G43.909

## 2019-06-27 HISTORY — DX: Headache, unspecified: R51.9

## 2019-06-27 LAB — BASIC METABOLIC PANEL
Anion gap: 12 (ref 5–15)
BUN: 8 mg/dL (ref 6–20)
CO2: 22 mmol/L (ref 22–32)
Calcium: 9.2 mg/dL (ref 8.9–10.3)
Chloride: 104 mmol/L (ref 98–111)
Creatinine, Ser: 0.53 mg/dL (ref 0.44–1.00)
GFR calc Af Amer: >60 mL/min (ref >60)
GFR calc non Af Amer: >60 mL/min (ref >60)
Glucose, Bld: 127 mg/dL — ABNORMAL HIGH (ref 70–99)
Potassium: 4.3 mmol/L (ref 3.5–5.1)
Sodium: 138 mmol/L (ref 135–145)

## 2019-06-27 LAB — SARS CORONAVIRUS 2 (TAT 6-24 HRS): SARS Coronavirus 2: NEGATIVE

## 2019-06-30 ENCOUNTER — Encounter (HOSPITAL_COMMUNITY): Payer: Self-pay

## 2019-06-30 ENCOUNTER — Encounter (HOSPITAL_COMMUNITY)
Admission: RE | Disposition: A | Discharge status: Home / Self Care | Payer: Self-pay | Source: Home / Self Care | Attending: Internal Medicine

## 2019-06-30 ENCOUNTER — Ambulatory Visit (HOSPITAL_COMMUNITY)
Admission: RE | Admit: 2019-06-30 | Discharge: 2019-06-30 | Disposition: A | Discharge status: Home / Self Care | Payer: BC Managed Care – PPO | Attending: Internal Medicine | Admitting: Internal Medicine

## 2019-06-30 ENCOUNTER — Ambulatory Visit (HOSPITAL_COMMUNITY): Payer: BC Managed Care – PPO | Admitting: Anesthesiology

## 2019-06-30 ENCOUNTER — Other Ambulatory Visit: Payer: Self-pay

## 2019-06-30 DIAGNOSIS — Z79899 Other long term (current) drug therapy: Secondary | ICD-10-CM | POA: Diagnosis not present

## 2019-06-30 DIAGNOSIS — F419 Anxiety disorder, unspecified: Secondary | ICD-10-CM | POA: Diagnosis not present

## 2019-06-30 DIAGNOSIS — Z7982 Long term (current) use of aspirin: Secondary | ICD-10-CM | POA: Insufficient documentation

## 2019-06-30 DIAGNOSIS — K529 Noninfective gastroenteritis and colitis, unspecified: Secondary | ICD-10-CM

## 2019-06-30 DIAGNOSIS — F329 Major depressive disorder, single episode, unspecified: Secondary | ICD-10-CM | POA: Insufficient documentation

## 2019-06-30 DIAGNOSIS — E785 Hyperlipidemia, unspecified: Secondary | ICD-10-CM | POA: Insufficient documentation

## 2019-06-30 DIAGNOSIS — I252 Old myocardial infarction: Secondary | ICD-10-CM | POA: Insufficient documentation

## 2019-06-30 DIAGNOSIS — I2510 Atherosclerotic heart disease of native coronary artery without angina pectoris: Secondary | ICD-10-CM | POA: Diagnosis not present

## 2019-06-30 DIAGNOSIS — E119 Type 2 diabetes mellitus without complications: Secondary | ICD-10-CM | POA: Insufficient documentation

## 2019-06-30 DIAGNOSIS — Z7984 Long term (current) use of oral hypoglycemic drugs: Secondary | ICD-10-CM | POA: Insufficient documentation

## 2019-06-30 DIAGNOSIS — F17210 Nicotine dependence, cigarettes, uncomplicated: Secondary | ICD-10-CM | POA: Diagnosis not present

## 2019-06-30 DIAGNOSIS — Z96642 Presence of left artificial hip joint: Secondary | ICD-10-CM | POA: Insufficient documentation

## 2019-06-30 DIAGNOSIS — I10 Essential (primary) hypertension: Secondary | ICD-10-CM | POA: Diagnosis not present

## 2019-06-30 DIAGNOSIS — R197 Diarrhea, unspecified: Secondary | ICD-10-CM | POA: Diagnosis present

## 2019-06-30 HISTORY — PX: COLONOSCOPY WITH PROPOFOL: SHX5780

## 2019-06-30 HISTORY — PX: BIOPSY: SHX5522

## 2019-06-30 LAB — GLUCOSE, CAPILLARY
Glucose-Capillary: 112 mg/dL — ABNORMAL HIGH (ref 70–99)
Glucose-Capillary: 107 mg/dL — ABNORMAL HIGH (ref 70–99)

## 2019-06-30 SURGERY — COLONOSCOPY WITH PROPOFOL
Anesthesia: General

## 2019-06-30 MED ORDER — LACTATED RINGERS IV SOLN
INTRAVENOUS | Status: DC | PRN
  Administered 2019-06-30: 08:00:00 via INTRAVENOUS

## 2019-06-30 MED ORDER — LIDOCAINE HCL (CARDIAC) PF 100 MG/5ML IV SOSY
PREFILLED_SYRINGE | INTRAVENOUS | Status: DC | PRN
  Administered 2019-06-30: 50 mg via INTRATRACHEAL

## 2019-06-30 MED ORDER — LIDOCAINE HCL URETHRAL/MUCOSAL 2 % EX GEL
CUTANEOUS | Status: AC
  Filled 2019-06-30: qty 30

## 2019-06-30 MED ORDER — CHLORHEXIDINE GLUCONATE CLOTH 2 % EX PADS: 6.0000 | MEDICATED_PAD | Freq: Once | CUTANEOUS | Status: DC

## 2019-06-30 MED ORDER — KETAMINE HCL 50 MG/5ML IJ SOSY
PREFILLED_SYRINGE | INTRAMUSCULAR | Status: AC
  Filled 2019-06-30: qty 5

## 2019-06-30 MED ORDER — LIDOCAINE 2% (20 MG/ML) 5 ML SYRINGE
INTRAMUSCULAR | Status: AC
  Filled 2019-06-30: qty 5

## 2019-06-30 MED ORDER — LIDOCAINE VISCOUS HCL 2 % MT SOLN
OROMUCOSAL | Status: AC
  Filled 2019-06-30: qty 15

## 2019-06-30 MED ORDER — KETAMINE HCL 10 MG/ML IJ SOLN
INTRAMUSCULAR | Status: DC | PRN
  Administered 2019-06-30 (×2): 10 mg via INTRAVENOUS

## 2019-06-30 MED ORDER — LACTATED RINGERS IV SOLN
Freq: Once | INTRAVENOUS | Status: AC
  Administered 2019-06-30: 08:00:00 via INTRAVENOUS

## 2019-06-30 MED ORDER — PROPOFOL 500 MG/50ML IV EMUL
INTRAVENOUS | Status: DC | PRN
  Administered 2019-06-30: 150 ug/kg/min via INTRAVENOUS

## 2019-06-30 MED ORDER — PROPOFOL 10 MG/ML IV BOLUS
INTRAVENOUS | Status: DC | PRN
  Administered 2019-06-30: 30 mg via INTRAVENOUS

## 2019-06-30 NOTE — Anesthesia Preprocedure Evaluation (Signed)
Anesthesia Evaluation  Patient identified by MRN, date of birth, ID band Patient awake    Reviewed: Allergy & Precautions, NPO status , Patient's Chart, lab work & pertinent test results, reviewed documented beta blocker date and time   Airway Mallampati: II  TM Distance: >3 FB Neck ROM: Full    Dental  (+) Missing   Pulmonary Current Smoker and Patient abstained from smoking.,    breath sounds clear to auscultation       Cardiovascular Exercise Tolerance: Poor hypertension, Pt. on medications and Pt. on home beta blockers + CAD, + Past MI and + Cardiac Stents   Rhythm:Regular Rate:Normal     Neuro/Psych  Headaches, PSYCHIATRIC DISORDERS Anxiety Depression  Neuromuscular disease (right foot drop)    GI/Hepatic negative GI ROS, Neg liver ROS,   Endo/Other  diabetes, Well Controlled, Type 2  Renal/GU      Musculoskeletal  (+) Arthritis , Osteoarthritis,  Left hip pain, on chronic pain meds   Abdominal   Peds  Hematology   Anesthesia Other Findings   Reproductive/Obstetrics                             Anesthesia Physical Anesthesia Plan  ASA: III  Anesthesia Plan: General   Post-op Pain Management:    Induction: Intravenous  PONV Risk Score and Plan: 0 and TIVA  Airway Management Planned: Nasal Cannula and Natural Airway  Additional Equipment:   Intra-op Plan:   Post-operative Plan:   Informed Consent: I have reviewed the patients History and Physical, chart, labs and discussed the procedure including the risks, benefits and alternatives for the proposed anesthesia with the patient or authorized representative who has indicated his/her understanding and acceptance.     Dental advisory given  Plan Discussed with: CRNA  Anesthesia Plan Comments:         Anesthesia Quick Evaluation

## 2019-06-30 NOTE — Anesthesia Postprocedure Evaluation (Signed)
Anesthesia Post Note  Patient: [name]  Procedure(s) Performed: COLONOSCOPY WITH PROPOFOL (N/A ) BIOPSY  Anesthesia Type: General Level of consciousness: awake Pain management: pain level controlled Vital Signs Assessment: post-procedure vital signs reviewed and stable Respiratory status: spontaneous breathing, nonlabored ventilation and respiratory function stable Cardiovascular status: stable Postop Assessment: no apparent nausea or vomiting Anesthetic complications: no     Last Vitals:  Vitals:   06/30/19 0737  BP: 130/66  Pulse: 81  Resp: 13  SpO2: 94%    Last Pain:  Vitals:   06/30/19 0841  TempSrc:   PainSc: 0-No pain                 Ahriyah Vannest Hristova

## 2019-06-30 NOTE — Discharge Instructions (Signed)
°  Colonoscopy Discharge Instructions  Read the instructions outlined below and refer to this sheet in the next few weeks. These discharge instructions provide you with general information on caring for yourself after you leave the hospital. Your doctor may also give you specific instructions. While your treatment has been planned according to the most current medical practices available, unavoidable complications occasionally occur. If you have any problems or questions after discharge, call Dr. Gala Romney at [phone]. ACTIVITY  You may resume your regular activity, but move at a slower pace for the next 24 hours.   Take frequent rest periods for the next 24 hours.   Walking will help get rid of the air and reduce the bloated feeling in your belly (abdomen).   No driving for 24 hours (because of the medicine (anesthesia) used during the test).    Do not sign any important legal documents or operate any machinery for 24 hours (because of the anesthesia used during the test).  NUTRITION  Drink plenty of fluids.   You may resume your normal diet as instructed by your doctor.   Begin with a light meal and progress to your normal diet. Heavy or fried foods are harder to digest and may make you feel sick to your stomach (nauseated).   Avoid alcoholic beverages for 24 hours or as instructed.  MEDICATIONS  You may resume your normal medications unless your doctor tells you otherwise.  WHAT YOU CAN EXPECT TODAY  Some feelings of bloating in the abdomen.   Passage of more gas than usual.   Spotting of blood in your stool or on the toilet paper.  IF YOU HAD POLYPS REMOVED DURING THE COLONOSCOPY:  No aspirin products for 7 days or as instructed.   No alcohol for 7 days or as instructed.   Eat a soft diet for the next 24 hours.  FINDING OUT THE RESULTS OF YOUR TEST Not all test results are available during your visit. If your test results are not back during the visit, make an appointment  with your caregiver to find out the results. Do not assume everything is normal if you have not heard from your caregiver or the medical facility. It is important for you to follow up on all of your test results.  SEEK IMMEDIATE MEDICAL ATTENTION IF:  You have more than a spotting of blood in your stool.   Your belly is swollen (abdominal distention).   You are nauseated or vomiting.   You have a temperature over 101.   You have abdominal pain or discomfort that is severe or gets worse throughout the day.   Further recommendations to follow pending review of pathology report  Next colonoscopy 10 years from now for screening purposes  At patient request I called Asencion Gowda, [phone], discussed results.
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
## 2019-06-30 NOTE — Transfer of Care (Signed)
Immediate Anesthesia Transfer of Care Note  Patient: [name]  Procedure(s) Performed: COLONOSCOPY WITH PROPOFOL (N/A ) BIOPSY  Patient Location: PACU  Anesthesia Type:General  Level of Consciousness: awake  Airway & Oxygen Therapy: Patient Spontanous Breathing  Post-op Assessment: Report given to RN and Post -op Vital signs reviewed and stable  Post vital signs: Reviewed and stable  Last Vitals:  Vitals Value Taken Time  BP    Temp    Pulse    Resp    SpO2      Last Pain:  Vitals:   06/30/19 0841  TempSrc:   PainSc: 0-No pain         Complications: No apparent anesthesia complications

## 2019-06-30 NOTE — Op Note (Signed)
[facility] Patient Name: [name] Procedure Date: 06/30/2019 8:18 AM MRN: [id number] Date of Birth: Aug 01, 1965 Attending MD: Norvel Richards , MD CSN: [id number] Age: 53 Admit Type: Outpatient Procedure:                Colonoscopy Indications:              Chronic diarrhea Providers:                Norvel Richards, MD, Jeanann Lewandowsky. Sharon Seller, RN,                            Aram Candela Referring MD:              Medicines:                Propofol per Anesthesia Complications:            No immediate complications. Estimated Blood Loss:     Estimated blood loss: none. Procedure:                Pre-Anesthesia Assessment:                           - Prior to the procedure, a History and Physical                            was performed, and patient medications and                            allergies were reviewed. The patient's tolerance of                            previous anesthesia was also reviewed. The risks                            and benefits of the procedure and the sedation                            options and risks were discussed with the patient.                            All questions were answered, and informed consent                            was obtained. Prior Anticoagulants: The patient has                            taken no previous anticoagulant or antiplatelet                            agents. ASA Grade Assessment: II - A patient with                            mild systemic disease. After reviewing the risks  and benefits, the patient was deemed in                            satisfactory condition to undergo the procedure.                           After obtaining informed consent, the colonoscope                            was passed under direct vision. Throughout the                            procedure, the patient's blood pressure, pulse, and                            oxygen saturations were monitored  continuously. The                            [id number]) scope was introduced through                            the anus and advanced to the the cecum, identified                            by appendiceal orifice and ileocecal valve. The                            colonoscopy was performed without difficulty. The                            patient tolerated the procedure well. The quality                            of the bowel preparation was adequate. Scope In: 8:46:20 AM Scope Out: 8:59:47 AM Scope Withdrawal Time: 0 hours 9 minutes 1 second  Total Procedure Duration: 0 hours 13 minutes 27 seconds  Findings:      The perianal and digital rectal examinations were normal.      The colon (entire examined portion) appeared normal side for multiple       innocent appearing AVMs about the ileocecal valve and cecum.      The retroflexed view of the distal rectum and anal verge was normal and       showed no anal or rectal abnormalities. Finally, segmental biopsies of       the right and left colon taken for histology. Impression:               - The entire examined colon is normal                            (cecal/ileocecal valve AVMs). Status post segmental                            biopsy.- The distal rectum and anal verge are  normal on retroflexion view. Moderate Sedation:      Moderate (conscious) sedation was personally administered by an       anesthesia professional. The following parameters were monitored: oxygen       saturation, heart rate, blood pressure, respiratory rate, EKG, adequacy       of pulmonary ventilation, and response to care. Recommendation:           - Patient has a contact number available for                            emergencies. The signs and symptoms of potential                            delayed complications were discussed with the                            patient. Return to normal activities tomorrow.                             Written discharge instructions were provided to the                            patient.                           - Resume previous diet.                           - Continue present medications. Follow-up on                            pathology.                           - Repeat colonoscopy in 10 years for screening                            purposes.                           - Return to GI office in 2 months. Need to keep in                            mind that the ongoing therapy with metformin may be                            a major contributing factor to diarrhea. Procedure Code(s):        --- Professional ---                           [id number], Colonoscopy, flexible; diagnostic, including                            collection of specimen(s) by brushing or washing,  when performed (separate procedure) Diagnosis Code(s):        --- Professional ---                           K52.9, Noninfective gastroenteritis and colitis,                            unspecified CPT copyright 2019 American Medical Association. All rights reserved. The codes documented in this report are preliminary and upon coder review may  be revised to meet current compliance requirements. Cristopher Estimable. Rourk, MD Norvel Richards, MD 06/30/2019 9:06:43 AM This report has been signed electronically. Number of Addenda: 0

## 2019-07-01 ENCOUNTER — Encounter: Payer: Self-pay | Admitting: Internal Medicine

## 2019-07-01 LAB — SURGICAL PATHOLOGY

## 2019-07-02 ENCOUNTER — Encounter (HOSPITAL_COMMUNITY): Payer: Self-pay | Admitting: Internal Medicine

## 2019-07-03 ENCOUNTER — Other Ambulatory Visit: Payer: Self-pay

## 2019-07-03 ENCOUNTER — Telehealth (HOSPITAL_COMMUNITY): Payer: Self-pay | Admitting: *Deleted

## 2019-07-03 DIAGNOSIS — I6529 Occlusion and stenosis of unspecified carotid artery: Secondary | ICD-10-CM

## 2019-07-03 DIAGNOSIS — I739 Peripheral vascular disease, unspecified: Secondary | ICD-10-CM

## 2019-07-03 NOTE — Telephone Encounter (Signed)

## 2019-07-04 ENCOUNTER — Ambulatory Visit (HOSPITAL_COMMUNITY)
Admission: RE | Admit: 2019-07-04 | Discharge: 2019-07-04 | Disposition: A | Discharge status: Home / Self Care | Payer: BC Managed Care – PPO | Source: Ambulatory Visit | Attending: Vascular Surgery | Admitting: Vascular Surgery

## 2019-07-04 ENCOUNTER — Encounter: Payer: BC Managed Care – PPO | Admitting: Vascular Surgery

## 2019-07-04 ENCOUNTER — Encounter (HOSPITAL_COMMUNITY): Payer: BC Managed Care – PPO

## 2019-07-04 ENCOUNTER — Ambulatory Visit (INDEPENDENT_AMBULATORY_CARE_PROVIDER_SITE_OTHER): Payer: BC Managed Care – PPO | Admitting: Vascular Surgery

## 2019-07-04 ENCOUNTER — Encounter: Payer: Self-pay | Admitting: Vascular Surgery

## 2019-07-04 ENCOUNTER — Other Ambulatory Visit: Payer: Self-pay

## 2019-07-04 ENCOUNTER — Ambulatory Visit (INDEPENDENT_AMBULATORY_CARE_PROVIDER_SITE_OTHER)
Admission: RE | Admit: 2019-07-04 | Discharge: 2019-07-04 | Disposition: A | Discharge status: Home / Self Care | Payer: BC Managed Care – PPO | Source: Ambulatory Visit | Attending: Vascular Surgery | Admitting: Vascular Surgery

## 2019-07-04 VITALS — BP 126/76 | HR 68 | Temp 97.7°F | Resp 20 | Ht 65.0 in | Wt 177.0 lb

## 2019-07-04 DIAGNOSIS — I6529 Occlusion and stenosis of unspecified carotid artery: Secondary | ICD-10-CM | POA: Insufficient documentation

## 2019-07-04 DIAGNOSIS — I739 Peripheral vascular disease, unspecified: Secondary | ICD-10-CM | POA: Diagnosis not present
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
## 2019-07-07 ENCOUNTER — Other Ambulatory Visit: Payer: BC Managed Care – PPO

## 2019-07-09 ENCOUNTER — Ambulatory Visit
Admission: RE | Admit: 2019-07-09 | Discharge: 2019-07-09 | Disposition: A | Discharge status: Home / Self Care | Payer: BC Managed Care – PPO | Source: Ambulatory Visit | Attending: Specialist | Admitting: Specialist

## 2019-07-09 ENCOUNTER — Other Ambulatory Visit: Payer: Self-pay

## 2019-07-09 DIAGNOSIS — R519 Headache, unspecified: Secondary | ICD-10-CM | POA: Diagnosis not present

## 2019-07-10 ENCOUNTER — Telehealth: Payer: Self-pay | Admitting: *Deleted

## 2019-07-10 ENCOUNTER — Ambulatory Visit (INDEPENDENT_AMBULATORY_CARE_PROVIDER_SITE_OTHER): Payer: BC Managed Care – PPO | Admitting: Neurology

## 2019-07-10 ENCOUNTER — Encounter: Payer: Self-pay | Admitting: Neurology

## 2019-07-10 ENCOUNTER — Other Ambulatory Visit: Payer: Self-pay

## 2019-07-10 VITALS — BP 122/71 | HR 64 | Temp 97.3°F | Ht 66.0 in | Wt 175.0 lb

## 2019-07-10 DIAGNOSIS — M545 Low back pain, unspecified: Secondary | ICD-10-CM

## 2019-07-10 DIAGNOSIS — M79671 Pain in right foot: Secondary | ICD-10-CM | POA: Diagnosis not present

## 2019-07-10 MED ORDER — GABAPENTIN 100 MG PO CAPS: 100.0000 mg | ORAL_CAPSULE | Freq: Three times a day (TID) | ORAL | 11 refills | Status: DC
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
## 2019-07-10 NOTE — Telephone Encounter (Signed)
The patient would like to have a NCV/EMG before the end of the year, if possible.  She is aware there are no available appts at this time but would like to be called if there are any cancellations.  Per vo by Dr. Krista Blue, she may have the test with any provider at our office.

## 2019-07-11 ENCOUNTER — Encounter: Payer: BC Managed Care – PPO | Admitting: Vascular Surgery

## 2019-07-16 NOTE — Telephone Encounter (Signed)
No available spots with any provider Monday AM.

## 2019-07-16 NOTE — Telephone Encounter (Signed)
We had a cancellation for Monday morning so I contacted patient to see if she would be able to come in for NCS/EMG. Patient was very appreciative and is aware to arrive by 9am on Monday, 07/21/19.

## 2019-07-16 NOTE — Telephone Encounter (Signed)
Will you please contact patient to see if she wants to fill the NCS/EMG spot on Monday morning? Thanks.

## 2019-07-21 ENCOUNTER — Encounter (INDEPENDENT_AMBULATORY_CARE_PROVIDER_SITE_OTHER): Payer: BC Managed Care – PPO | Admitting: Neurology

## 2019-07-21 ENCOUNTER — Ambulatory Visit (INDEPENDENT_AMBULATORY_CARE_PROVIDER_SITE_OTHER): Payer: BC Managed Care – PPO | Admitting: Neurology

## 2019-07-21 ENCOUNTER — Other Ambulatory Visit: Payer: Self-pay

## 2019-07-21 DIAGNOSIS — M545 Low back pain, unspecified: Secondary | ICD-10-CM

## 2019-07-21 DIAGNOSIS — M79671 Pain in right foot: Secondary | ICD-10-CM

## 2019-07-21 DIAGNOSIS — Z0289 Encounter for other administrative examinations: Secondary | ICD-10-CM

## 2019-07-21 NOTE — Patient Instructions (Signed)
Chrystie Nose. Doran Durand, MD - [facility] [place] [address] reviews Doctor, general practice in [place], [place]: [email or web address] Get online care: [email or web address] Address: [address] [place], [place], [place] [address]

## 2019-07-22 ENCOUNTER — Ambulatory Visit (INDEPENDENT_AMBULATORY_CARE_PROVIDER_SITE_OTHER): Payer: BC Managed Care – PPO

## 2019-07-22 ENCOUNTER — Other Ambulatory Visit: Payer: Self-pay

## 2019-07-22 DIAGNOSIS — M545 Low back pain, unspecified: Secondary | ICD-10-CM

## 2019-07-22 DIAGNOSIS — M79671 Pain in right foot: Secondary | ICD-10-CM | POA: Diagnosis not present

## 2019-07-23 ENCOUNTER — Telehealth: Payer: Self-pay | Admitting: Neurology

## 2019-07-23 ENCOUNTER — Other Ambulatory Visit: Payer: Self-pay | Admitting: *Deleted

## 2019-07-23 DIAGNOSIS — I6529 Occlusion and stenosis of unspecified carotid artery: Secondary | ICD-10-CM

## 2019-07-23 DIAGNOSIS — I739 Peripheral vascular disease, unspecified: Secondary | ICD-10-CM
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
## 2019-07-23 NOTE — Telephone Encounter (Signed)
IMPRESSION: This MRI of the lumbar spine without contrast shows the following: 1.   At L5-S1, there is a small disc protrusion slightly to the right causing moderate right lateral recess stenosis encroaching upon the right S1 nerve root without causing compression. 2.   There are minimal to mild disc bulges elsewhere in the lumbar spine.  Please call patient, MRI of lumbar showed mild degenerative disease, there is no evidence of canal or foraminal narrowing

## 2019-07-23 NOTE — Telephone Encounter (Signed)
I was able to speak with the patient's husband on DPR.  He verbalized understanding of the results and will provide this information to her. He will have her call back with any questions.

## 2019-07-23 NOTE — Telephone Encounter (Signed)
Open in error

## 2019-07-23 NOTE — Telephone Encounter (Signed)
The patient called back and her results were provided to her.

## 2019-07-31 ENCOUNTER — Telehealth: Payer: Self-pay | Admitting: Neurology

## 2019-07-31 NOTE — Telephone Encounter (Signed)
[facility] has been trying to call patient multiple times. Patient has not responded. Patient will need to call them to schedule [phone] . I called and left patient a message as well.

## 2019-09-12 ENCOUNTER — Other Ambulatory Visit: Payer: Self-pay | Admitting: *Deleted

## 2019-09-12 MED ORDER — ATORVASTATIN CALCIUM 80 MG PO TABS: 80.0000 mg | ORAL_TABLET | Freq: Every day | ORAL | 2 refills | Status: DC

## 2019-09-12 MED ORDER — METOPROLOL TARTRATE 25 MG PO TABS: 25.0000 mg | ORAL_TABLET | Freq: Two times a day (BID) | ORAL | 2 refills | Status: DC

## 2019-09-12 MED ORDER — BENAZEPRIL HCL 5 MG PO TABS: 5.0000 mg | ORAL_TABLET | Freq: Every day | ORAL | 2 refills | Status: DC

## 2020-05-06 ENCOUNTER — Ambulatory Visit (INDEPENDENT_AMBULATORY_CARE_PROVIDER_SITE_OTHER): Payer: 59 | Admitting: Cardiology

## 2020-05-06 ENCOUNTER — Encounter: Payer: Self-pay | Admitting: Cardiology

## 2020-05-06 ENCOUNTER — Other Ambulatory Visit: Payer: Self-pay

## 2020-05-06 ENCOUNTER — Telehealth: Payer: Self-pay | Admitting: Licensed Clinical Social Worker

## 2020-05-06 VITALS — BP 162/72 | HR 90 | Ht 65.0 in | Wt 172.0 lb

## 2020-05-06 DIAGNOSIS — E782 Mixed hyperlipidemia: Secondary | ICD-10-CM | POA: Diagnosis not present

## 2020-05-06 DIAGNOSIS — I10 Essential (primary) hypertension: Secondary | ICD-10-CM | POA: Diagnosis not present

## 2020-05-06 DIAGNOSIS — R011 Cardiac murmur, unspecified: Secondary | ICD-10-CM

## 2020-05-06 DIAGNOSIS — I2510 Atherosclerotic heart disease of native coronary artery without angina pectoris: Secondary | ICD-10-CM

## 2020-05-06 NOTE — Telephone Encounter (Signed)
CSW referred to assist patient with obtaining a BP cuff. CSW contacted patient to inform cuff will be delivered to home. Patient grateful for support and assistance. CSW available as needed. Jackie Daud Cayer, LCSW, CCSW-MCS [phone]  

## 2020-05-06 NOTE — Patient Instructions (Signed)
Your physician wants you to follow-up in: 6 MONTHS WITH DR. BRANCH You will receive a reminder letter in the mail two months in advance. If you don't receive a letter, please call our office to schedule the follow-up appointment.  Your physician recommends that you continue on your current medications as directed. Please refer to the Current Medication list given to you today.  Your physician has requested that you have an echocardiogram. Echocardiography is a painless test that uses sound waves to create images of your heart. It provides your doctor with information about the size and shape of your heart and how well your heart's chambers and valves are working. This procedure takes approximately one hour. There are no restrictions for this procedure.  Thank you for choosing [facility]!!   
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
## 2020-05-11 ENCOUNTER — Ambulatory Visit: Payer: BC Managed Care – PPO | Admitting: Cardiology

## 2020-05-20 ENCOUNTER — Other Ambulatory Visit: Payer: Self-pay | Admitting: Family Medicine

## 2020-05-20 MED ORDER — ATORVASTATIN CALCIUM 80 MG PO TABS
80.0000 mg | ORAL_TABLET | Freq: Every day | ORAL | 1 refills | Status: DC
Start: 2020-05-20 — End: 2020-07-26

## 2020-05-20 MED ORDER — BENAZEPRIL HCL 5 MG PO TABS
5.0000 mg | ORAL_TABLET | Freq: Every day | ORAL | 1 refills | Status: DC
Start: 2020-05-20 — End: 2020-12-09

## 2020-05-20 MED ORDER — METOPROLOL TARTRATE 25 MG PO TABS
25.0000 mg | ORAL_TABLET | Freq: Two times a day (BID) | ORAL | 1 refills | Status: DC
Start: 2020-05-20 — End: 2020-07-26

## 2020-05-20 NOTE — Telephone Encounter (Signed)
Medication sent to pharmacy  

## 2020-05-20 NOTE — Telephone Encounter (Signed)
Patient called stating that she has 4 heart medications that need to be sent to [facility]. States that the medications need to have Dr. Court Joy name taken off.

## 2020-06-01 ENCOUNTER — Other Ambulatory Visit: Payer: 59

## 2020-07-26 ENCOUNTER — Other Ambulatory Visit: Payer: Self-pay

## 2020-07-26 MED ORDER — METOPROLOL TARTRATE 25 MG PO TABS: 25.0000 mg | ORAL_TABLET | Freq: Two times a day (BID) | ORAL | 3 refills | Status: DC

## 2020-07-26 MED ORDER — ATORVASTATIN CALCIUM 80 MG PO TABS
80.0000 mg | ORAL_TABLET | Freq: Every day | ORAL | 3 refills | Status: DC
Start: 2020-07-26 — End: 2021-07-27

## 2020-07-26 NOTE — Telephone Encounter (Signed)
Medication refill for Metoprolol Tartrate 25 mg tablets

## 2020-07-26 NOTE — Telephone Encounter (Signed)
Medication refill for Lipitor 80 mg tablets

## 2020-12-08 ENCOUNTER — Other Ambulatory Visit: Payer: Self-pay | Admitting: Cardiology

## 2021-01-02 ENCOUNTER — Other Ambulatory Visit: Payer: Self-pay | Admitting: Cardiology

## 2021-03-11 ENCOUNTER — Other Ambulatory Visit: Payer: Self-pay | Admitting: Cardiology

## 2021-04-28 ENCOUNTER — Ambulatory Visit: Payer: 59 | Admitting: Cardiology

## 2021-05-10 ENCOUNTER — Ambulatory Visit: Payer: 59 | Admitting: Cardiology

## 2021-05-19 ENCOUNTER — Other Ambulatory Visit: Payer: Self-pay | Admitting: Internal Medicine

## 2021-05-19 DIAGNOSIS — Z1231 Encounter for screening mammogram for malignant neoplasm of breast: Secondary | ICD-10-CM

## 2021-07-27 ENCOUNTER — Other Ambulatory Visit: Payer: Self-pay | Admitting: Cardiology

## 2021-08-01 ENCOUNTER — Ambulatory Visit: Payer: 59 | Admitting: Cardiology

## 2021-08-03 ENCOUNTER — Ambulatory Visit: Payer: 59

## 2021-08-25 ENCOUNTER — Other Ambulatory Visit: Payer: Self-pay

## 2021-08-25 ENCOUNTER — Inpatient Hospital Stay (HOSPITAL_COMMUNITY): Payer: 59

## 2021-08-25 ENCOUNTER — Inpatient Hospital Stay (HOSPITAL_COMMUNITY): Payer: 59 | Attending: Hematology | Admitting: Hematology

## 2021-08-25 ENCOUNTER — Encounter (HOSPITAL_COMMUNITY): Payer: Self-pay | Admitting: Hematology

## 2021-08-25 VITALS — BP 174/81 | HR 71 | Temp 97.4°F | Resp 16 | Ht 63.0 in | Wt 160.0 lb

## 2021-08-25 DIAGNOSIS — I252 Old myocardial infarction: Secondary | ICD-10-CM | POA: Diagnosis not present

## 2021-08-25 DIAGNOSIS — Z803 Family history of malignant neoplasm of breast: Secondary | ICD-10-CM | POA: Diagnosis not present

## 2021-08-25 DIAGNOSIS — F17210 Nicotine dependence, cigarettes, uncomplicated: Secondary | ICD-10-CM | POA: Diagnosis not present

## 2021-08-25 DIAGNOSIS — Z809 Family history of malignant neoplasm, unspecified: Secondary | ICD-10-CM | POA: Insufficient documentation

## 2021-08-25 DIAGNOSIS — Z90710 Acquired absence of both cervix and uterus: Secondary | ICD-10-CM | POA: Diagnosis not present

## 2021-08-25 DIAGNOSIS — Z808 Family history of malignant neoplasm of other organs or systems: Secondary | ICD-10-CM | POA: Diagnosis not present

## 2021-08-25 DIAGNOSIS — D72829 Elevated white blood cell count, unspecified: Secondary | ICD-10-CM | POA: Diagnosis not present

## 2021-08-25 LAB — CBC WITH DIFFERENTIAL/PLATELET
Abs Immature Granulocytes: 0.08 10*3/uL — ABNORMAL HIGH (ref 0.00–0.07)
Basophils Absolute: 0.1 10*3/uL (ref 0.0–0.1)
Basophils Relative: 1 %
Eosinophils Absolute: 0.3 10*3/uL (ref 0.0–0.5)
Eosinophils Relative: 2 %
HCT: 42.1 % (ref 36.0–46.0)
Hemoglobin: 13 g/dL (ref 12.0–15.0)
Immature Granulocytes: 1 %
Lymphocytes Relative: 31 %
Lymphs Abs: 4.9 10*3/uL — ABNORMAL HIGH (ref 0.7–4.0)
MCH: 29.1 pg (ref 26.0–34.0)
MCHC: 30.9 g/dL (ref 30.0–36.0)
MCV: 94.2 fL (ref 80.0–100.0)
Monocytes Absolute: 0.8 10*3/uL (ref 0.1–1.0)
Monocytes Relative: 5 %
Neutro Abs: 9.9 10*3/uL — ABNORMAL HIGH (ref 1.7–7.7)
Neutrophils Relative %: 60 %
Platelets: 297 10*3/uL (ref 150–400)
RBC: 4.47 MIL/uL (ref 3.87–5.11)
RDW: 14.6 % (ref 11.5–15.5)
WBC: 16.1 10*3/uL — ABNORMAL HIGH (ref 4.0–10.5)
nRBC: 0 % (ref 0.0–0.2)

## 2021-08-25 LAB — LACTATE DEHYDROGENASE: LDH: 82 U/L — ABNORMAL LOW (ref 98–192)

## 2021-08-25 LAB — SEDIMENTATION RATE: Sed Rate: 19 mm/hr (ref 0–22)

## 2021-08-25 LAB — C-REACTIVE PROTEIN: CRP: 0.6 mg/dL (ref <1.0)

## 2021-08-25 NOTE — Patient Instructions (Signed)
[place] at [facility] Discharge Instructions   You were seen and examined today by Dr. Delton Coombes.  We will obtain some special blood tests to see if we can determine the reason for your elevated white blood cell count.  Return as scheduled for Dr. Raliegh Ip to review the results of these labs with you.    Thank you for choosing [place] at [facility] to provide your oncology and hematology care.  To afford each patient quality time with our provider, please arrive at least 15 minutes before your scheduled appointment time.   If you have a lab appointment with the [facility] please come in thru the Main Entrance and check in at the main information desk.  You need to re-schedule your appointment should you arrive 10 or more minutes late.  We strive to give you quality time with our providers, and arriving late affects you and other patients whose appointments are after yours.  Also, if you no show three or more times for appointments you may be dismissed from the clinic at the providers discretion.     Again, thank you for choosing [facility].  Our hope is that these requests will decrease the amount of time that you wait before being seen by our physicians.       _____________________________________________________________  Should you have questions after your visit to [facility], please contact our office at [phone] and follow the prompts.  Our office hours are 8:00 a.m. and 4:30 p.m. Monday - Friday.  Please note that voicemails left after 4:00 p.m. may not be returned until the following business day.  We are closed weekends and major holidays.  You do have access to a nurse 24-7, just call the main number to the clinic [phone] and do not press any options, hold on the line and a nurse will answer the phone.    For prescription refill requests, have your pharmacy contact our office and allow 72 hours.     Due to Covid, you will need to wear a mask upon entering the hospital. If you do not have a mask, a mask will be given to you at the Main Entrance upon arrival. For doctor visits, patients may have 1 support person age 57 or older with them. For treatment visits, patients can not have anyone with them due to social distancing guidelines and our immunocompromised population.
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
## 2021-08-25 NOTE — Progress Notes (Signed)
Patient presents today for new patient visit. Patient very tearful, stating that she has numerous familial health issues that she is currently managing outside of her own health. Patient reports that she fears this has impacted her blood work.  Dr. Delton Coombes made aware prior to seeing patient.

## 2021-08-26 LAB — RHEUMATOID FACTOR: Rheumatoid fact SerPl-aCnc: <10 IU/mL (ref <14.0)

## 2021-08-29 LAB — BCR-ABL1 FISH
Cells Analyzed: 200
Cells Counted: 200

## 2021-08-29 LAB — SURGICAL PATHOLOGY

## 2021-08-30 LAB — ANTINUCLEAR ANTIBODIES, IFA: ANA Ab, IFA: NEGATIVE

## 2021-09-06 LAB — CALR + JAK2 E12-15 + MPL (REFLEXED)

## 2021-09-06 LAB — JAK2 V617F, W REFLEX TO CALR/E12/MPL

## 2021-09-07 LAB — FLOW CYTOMETRY

## 2021-09-15 ENCOUNTER — Encounter: Payer: Self-pay | Admitting: Cardiology

## 2021-09-15 ENCOUNTER — Ambulatory Visit: Payer: 59 | Admitting: Cardiology

## 2021-09-15 VITALS — BP 138/64 | HR 80 | Ht 65.0 in | Wt 160.8 lb

## 2021-09-15 DIAGNOSIS — I10 Essential (primary) hypertension: Secondary | ICD-10-CM | POA: Diagnosis not present

## 2021-09-15 DIAGNOSIS — I2510 Atherosclerotic heart disease of native coronary artery without angina pectoris: Secondary | ICD-10-CM | POA: Diagnosis not present

## 2021-09-15 DIAGNOSIS — I6529 Occlusion and stenosis of unspecified carotid artery: Secondary | ICD-10-CM

## 2021-09-15 DIAGNOSIS — R011 Cardiac murmur, unspecified: Secondary | ICD-10-CM

## 2021-09-15 DIAGNOSIS — E782 Mixed hyperlipidemia: Secondary | ICD-10-CM | POA: Diagnosis not present

## 2021-09-15 MED ORDER — BENAZEPRIL HCL 10 MG PO TABS: 10.0000 mg | ORAL_TABLET | Freq: Every day | ORAL | 3 refills | Status: DC
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
## 2021-09-15 NOTE — Patient Instructions (Signed)
Medication Instructions:  Your physician has recommended you make the following change in your medication:  Benazepril increased to 10 mg once a day. Continue medications as directed  Labwork: none  Testing/Procedures: You have been referred to Dr. Donzetta Matters, Vascular Your physician has requested that you have an echocardiogram. Echocardiography is a painless test that uses sound waves to create images of your heart. It provides your doctor with information about the size and shape of your heart and how well your hearts chambers and valves are working. This procedure takes approximately one hour. There are no restrictions for this procedure.   Follow-Up: Your physician recommends that you schedule a follow-up appointment in: 6 months  Any Other Special Instructions Will Be Listed Below (If Applicable).  If you need a refill on your cardiac medications before your next appointment, please call your pharmacy.

## 2021-09-22 ENCOUNTER — Other Ambulatory Visit: Payer: Self-pay

## 2021-09-22 ENCOUNTER — Inpatient Hospital Stay (HOSPITAL_COMMUNITY): Payer: 59 | Attending: Hematology | Admitting: Hematology

## 2021-09-22 ENCOUNTER — Encounter (HOSPITAL_COMMUNITY): Payer: Self-pay | Admitting: Hematology

## 2021-09-22 VITALS — BP 145/82 | HR 79 | Temp 97.7°F | Resp 19 | Ht 64.0 in | Wt 165.0 lb

## 2021-09-22 DIAGNOSIS — F17210 Nicotine dependence, cigarettes, uncomplicated: Secondary | ICD-10-CM | POA: Insufficient documentation

## 2021-09-22 DIAGNOSIS — Z809 Family history of malignant neoplasm, unspecified: Secondary | ICD-10-CM | POA: Diagnosis not present

## 2021-09-22 DIAGNOSIS — D72829 Elevated white blood cell count, unspecified: Secondary | ICD-10-CM | POA: Insufficient documentation

## 2021-09-22 DIAGNOSIS — Z808 Family history of malignant neoplasm of other organs or systems: Secondary | ICD-10-CM | POA: Insufficient documentation

## 2021-09-22 DIAGNOSIS — Z803 Family history of malignant neoplasm of breast: Secondary | ICD-10-CM | POA: Insufficient documentation
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
## 2021-09-22 NOTE — Patient Instructions (Signed)
[place] at [facility] ?Discharge Instructions ? ?You were seen and examined today by Dr. Delton Coombes. He reviewed your most recent labs and everything looks good except your white blood cell count is still high. Dr. Delton Coombes said that this is most likely coming from your smoking since we have ruled out leukemias. Please keep follow up appointments as scheduled in 6 months. ? ? ?Thank you for choosing [place] at [facility] to provide your oncology and hematology care.  To afford each patient quality time with our provider, please arrive at least 15 minutes before your scheduled appointment time.  ? ?If you have a lab appointment with the [facility] please come in thru the Main Entrance and check in at the main information desk. ? ?You need to re-schedule your appointment should you arrive 10 or more minutes late.  We strive to give you quality time with our providers, and arriving late affects you and other patients whose appointments are after yours.  Also, if you no show three or more times for appointments you may be dismissed from the clinic at the providers discretion.     ?Again, thank you for choosing [facility].  Our hope is that these requests will decrease the amount of time that you wait before being seen by our physicians.       ?_____________________________________________________________ ? ?Should you have questions after your visit to [facility], please contact our office at [phone] and follow the prompts.  Our office hours are 8:00 a.m. and 4:30 p.m. Monday - Friday.  Please note that voicemails left after 4:00 p.m. may not be returned until the following business day.  We are closed weekends and major holidays.  You do have access to a nurse 24-7, just call the main number to the clinic [phone] and do not press any options, hold on the line and a nurse will answer the phone.   ? ?For prescription refill  requests, have your pharmacy contact our office and allow 72 hours.   ? ?Due to Covid, you will need to wear a mask upon entering the hospital. If you do not have a mask, a mask will be given to you at the Main Entrance upon arrival. For doctor visits, patients may have 1 support person age 7 or older with them. For treatment visits, patients can not have anyone with them due to social distancing guidelines and our immunocompromised population.  ? ?  ?

## 2021-10-04 ENCOUNTER — Other Ambulatory Visit (HOSPITAL_COMMUNITY): Payer: Self-pay | Admitting: Internal Medicine

## 2021-10-04 DIAGNOSIS — M79642 Pain in left hand: Secondary | ICD-10-CM

## 2021-10-04 DIAGNOSIS — M79641 Pain in right hand: Secondary | ICD-10-CM

## 2021-10-12 ENCOUNTER — Ambulatory Visit (HOSPITAL_COMMUNITY)
Admission: RE | Admit: 2021-10-12 | Discharge: 2021-10-12 | Disposition: A | Discharge status: Home / Self Care | Payer: 59 | Source: Ambulatory Visit | Attending: Internal Medicine | Admitting: Internal Medicine

## 2021-10-12 ENCOUNTER — Ambulatory Visit (HOSPITAL_COMMUNITY)
Admission: RE | Admit: 2021-10-12 | Discharge: 2021-10-12 | Disposition: A | Discharge status: Home / Self Care | Payer: 59 | Source: Ambulatory Visit | Attending: Cardiology | Admitting: Cardiology

## 2021-10-12 ENCOUNTER — Other Ambulatory Visit: Payer: Self-pay

## 2021-10-12 DIAGNOSIS — M79641 Pain in right hand: Secondary | ICD-10-CM

## 2021-10-12 DIAGNOSIS — M79642 Pain in left hand: Secondary | ICD-10-CM | POA: Insufficient documentation

## 2021-10-12 DIAGNOSIS — R011 Cardiac murmur, unspecified: Secondary | ICD-10-CM | POA: Diagnosis present

## 2021-10-12 LAB — ECHOCARDIOGRAM COMPLETE
Area-P 1/2: 3.85 cm2
S' Lateral: 4 cm

## 2021-10-12 NOTE — Progress Notes (Signed)
*  PRELIMINARY RESULTS* ?Echocardiogram ?2D Echocardiogram has been performed. ? ?[name] ?10/12/2021, 10:16 AM ?

## 2021-10-13 ENCOUNTER — Telehealth: Payer: Self-pay

## 2021-10-13 NOTE — Telephone Encounter (Signed)
-----   Message from Arnoldo Lenis, MD sent at 10/12/2021  4:14 PM EDT ----- ?Echo looks good, the heart pumping function is normal. Very mild leak of mitral valve which creates a murmur but is so mild its not of concern at this time, just something to monitor ? ?Zandra Abts MD ?

## 2021-10-13 NOTE — Telephone Encounter (Signed)
Patient notified and verbalized understanding. Pt had no questions or concerns at this time. PCP copied.  ?

## 2021-10-23 ENCOUNTER — Other Ambulatory Visit: Payer: Self-pay

## 2021-10-23 DIAGNOSIS — I739 Peripheral vascular disease, unspecified: Secondary | ICD-10-CM

## 2021-10-23 DIAGNOSIS — I779 Disorder of arteries and arterioles, unspecified: Secondary | ICD-10-CM
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
## 2021-11-02 ENCOUNTER — Ambulatory Visit: Payer: 59 | Admitting: Physician Assistant

## 2021-11-02 ENCOUNTER — Ambulatory Visit (INDEPENDENT_AMBULATORY_CARE_PROVIDER_SITE_OTHER)
Admission: RE | Admit: 2021-11-02 | Discharge: 2021-11-02 | Disposition: A | Discharge status: Home / Self Care | Payer: 59 | Source: Ambulatory Visit | Attending: Vascular Surgery | Admitting: Vascular Surgery

## 2021-11-02 ENCOUNTER — Other Ambulatory Visit: Payer: Self-pay

## 2021-11-02 ENCOUNTER — Ambulatory Visit (HOSPITAL_COMMUNITY)
Admission: RE | Admit: 2021-11-02 | Discharge: 2021-11-02 | Disposition: A | Discharge status: Home / Self Care | Payer: 59 | Source: Ambulatory Visit | Attending: Vascular Surgery | Admitting: Vascular Surgery

## 2021-11-02 VITALS — BP 116/63 | HR 70 | Temp 98.0°F | Ht 64.0 in | Wt 162.2 lb

## 2021-11-02 DIAGNOSIS — I779 Disorder of arteries and arterioles, unspecified: Secondary | ICD-10-CM

## 2021-11-02 DIAGNOSIS — I739 Peripheral vascular disease, unspecified: Secondary | ICD-10-CM | POA: Insufficient documentation

## 2021-11-11 ENCOUNTER — Ambulatory Visit
Admission: RE | Admit: 2021-11-11 | Discharge: 2021-11-11 | Disposition: A | Discharge status: Home / Self Care | Payer: 59 | Source: Ambulatory Visit | Attending: Vascular Surgery | Admitting: Vascular Surgery

## 2021-11-11 DIAGNOSIS — I779 Disorder of arteries and arterioles, unspecified: Secondary | ICD-10-CM

## 2021-11-11 MED ORDER — IOPAMIDOL (ISOVUE-370) INJECTION 76%
75.0000 mL | Freq: Once | INTRAVENOUS | Status: AC | PRN
Start: 2021-11-11 — End: 2021-11-11
  Administered 2021-11-11: 75 mL via INTRAVENOUS

## 2021-11-16 ENCOUNTER — Ambulatory Visit: Payer: 59 | Admitting: Vascular Surgery

## 2021-11-16 ENCOUNTER — Telehealth: Payer: Self-pay | Admitting: Cardiology

## 2021-11-16 VITALS — BP 129/57 | HR 68 | Temp 98.6°F | Resp 20 | Ht 64.0 in | Wt 165.0 lb

## 2021-11-16 DIAGNOSIS — I779 Disorder of arteries and arterioles, unspecified: Secondary | ICD-10-CM
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
## 2021-11-16 NOTE — Telephone Encounter (Signed)
Patient called and mentioned that Dr. Donzetta Matters wanted patient to let Dr. Harl Bowie about the surgery. Would like for a call back from Dr. Harl Bowie or nurse ?

## 2021-11-17 ENCOUNTER — Telehealth: Payer: Self-pay | Admitting: *Deleted

## 2021-11-17 ENCOUNTER — Ambulatory Visit: Payer: 59 | Admitting: Cardiology

## 2021-11-17 NOTE — Telephone Encounter (Signed)
Consent is done; med rec still needs to be done.  ? ?See notes below:  ? ?Pt was not happy about having to have another appt as she was just seen 09/15/21 with Dr. Harl Bowie. I stated that we are just asking to do a tele phone visit. Pt states she doesn't know how that is going to help and with the type of surgery she is having should she not be seen in person. I said we could do that but [place] office first appt is 12/2021 and [place] is 01/2022. I asked the pt would she be ok coming to [place]. Pt said yes. I offered an but she decided to just go ahead with the tele appt,. Pt states she has a lot going on right now, her mom just passed, the surgery she has to have. In fact we did not do med rec as she was at the funeral home making arrangements. I did try to explain the protocol to the pt.  ?

## 2021-11-17 NOTE — Telephone Encounter (Signed)
Advised that surgeon's office would need to contact us directly ?Verbalized understanding ?

## 2021-11-17 NOTE — Telephone Encounter (Signed)
? ?  Pre-operative Risk Assessment  ?  ?Patient Name: [name]  ?DOB: 04-11-1966 ?MRN: [id number]  ? ?  ? ?Request for Surgical Clearance   ? ?Procedure:   Left carotid endarterectomy ? ?Date of Surgery:  Clearance 12/09/21                              ?   ?Surgeon:  Dr. Servando Snare ?Surgeon's Group or Practice Name:  [facility] ?Phone number:  [phone] ?Fax number:  [phone] ?  ?Type of Clearance Requested:   ?- Medical  ?  ?Type of Anesthesia:  General  ?  ?Additional requests/questions:   ? ?Signed, ?Marlou Sa   ?11/17/2021, 3:34 PM  ?

## 2021-11-17 NOTE — Telephone Encounter (Signed)
Pt was not happy about having to have another appt as she was just seen 09/15/21 with Dr. Harl Bowie. I stated that we are just asking to do a tele phone visit. Pt states she doesn't know how that is going to help and with the type of surgery she is having should she not be seen in person. I said we could do that but [place] office first appt is 12/2021 and [place] is 01/2022. I asked the pt would she be ok coming to [place]. Pt said yes. I offered an but she decided to just go ahead with the tele appt,. Pt states she has a lot going on right now, her mom just passed, the surgery she has to have. In fact we did not do med rec as she was at the funeral home making arrangements. I did try to explain the protocol to the pt.  ?

## 2021-11-17 NOTE — Telephone Encounter (Signed)
? ? ?  Name: [name]  ?DOB: [birth year]  ?MRN: [id number] ? ?Primary Cardiologist: Carlyle Dolly, MD ? ? ?Preoperative team, please contact this patient and set up a phone call appointment for further preoperative risk assessment. Please obtain consent and complete medication review. Thank you for your help. ? ?I confirm that guidance regarding antiplatelet and oral anticoagulation therapy has been completed and, if necessary, noted below. ? ? ? ?Christell Faith, PA-C ?11/17/2021, 3:42 PM ?[facility] ?[address] ?[place], [place] [address] ? ? ?

## 2021-11-17 NOTE — Telephone Encounter (Signed)
Consent is done.  ? ?  ?Patient Consent for Virtual Visit  ? ? ?   ? ?[name] has provided verbal consent on 11/17/2021 for a virtual visit (video or telephone). ? ? ?CONSENT FOR VIRTUAL VISIT FOR:  [name]  ?By participating in this virtual visit I agree to the following: ? ?I hereby voluntarily request, consent and authorize [facility] and its employed or contracted physicians, physician assistants, nurse practitioners or other licensed health care professionals (the Practitioner), to provide me with telemedicine health care services (the ?Services") as deemed necessary by the treating Practitioner. I acknowledge and consent to receive the Services by the Practitioner via telemedicine. I understand that the telemedicine visit will involve communicating with the Practitioner through live audiovisual communication technology and the disclosure of certain medical information by electronic transmission. I acknowledge that I have been given the opportunity to request an in-person assessment or other available alternative prior to the telemedicine visit and am voluntarily participating in the telemedicine visit. ? ?I understand that I have the right to withhold or withdraw my consent to the use of telemedicine in the course of my care at any time, without affecting my right to future care or treatment, and that the Practitioner or I may terminate the telemedicine visit at any time. I understand that I have the right to inspect all information obtained and/or recorded in the course of the telemedicine visit and may receive copies of available information for a reasonable fee.  I understand that some of the potential risks of receiving the Services via telemedicine include:  ?Delay or interruption in medical evaluation due to technological equipment failure or disruption; ?Information transmitted may not be sufficient (e.g. poor resolution of images) to allow for appropriate medical decision making by the  Practitioner; and/or  ?In rare instances, security protocols could fail, causing a breach of personal health information. ? ?Furthermore, I acknowledge that it is my responsibility to provide information about my medical history, conditions and care that is complete and accurate to the best of my ability. I acknowledge that Practitioner's advice, recommendations, and/or decision may be based on factors not within their control, such as incomplete or inaccurate data provided by me or distortions of diagnostic images or specimens that may result from electronic transmissions. I understand that the practice of medicine is not an exact science and that Practitioner makes no warranties or guarantees regarding treatment outcomes. I acknowledge that a copy of this consent can be made available to me via my patient portal ([name]), or I can request a printed copy by calling the office of [place].   ? ?I understand that my insurance will be billed for this visit.  ? ?I have read or had this consent read to me. ?I understand the contents of this consent, which adequately explains the benefits and risks of the Services being provided via telemedicine.  ?I have been provided ample opportunity to ask questions regarding this consent and the Services and have had my questions answered to my satisfaction. ?I give my informed consent for the services to be provided through the use of telemedicine in my medical care ? ? ? ?

## 2021-11-18 ENCOUNTER — Ambulatory Visit (INDEPENDENT_AMBULATORY_CARE_PROVIDER_SITE_OTHER): Payer: 59 | Admitting: Physician Assistant

## 2021-11-18 DIAGNOSIS — Z01810 Encounter for preprocedural cardiovascular examination: Secondary | ICD-10-CM
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
## 2021-11-23 ENCOUNTER — Other Ambulatory Visit: Payer: Self-pay

## 2021-11-23 DIAGNOSIS — I779 Disorder of arteries and arterioles, unspecified: Secondary | ICD-10-CM

## 2021-12-06 NOTE — Pre-Procedure Instructions (Addendum)
Surgical Instructions ? ? ? Your procedure is scheduled on Friday 12/09/21. ? ? Report to [name] Entr[facility] "A" at 05:30 A.M., then check in with the Admitting office. ? Call this number if you have problems the morning of surgery: ? [phone] ? ? If you have any questions prior to your surgery date call [phone]: Open Monday-Friday 8am-4pm ? ? ? Remember: ? Do not eat or drink after midnight the night before your surgery ? ?  ? Take these medicines the morning of surgery with A SIP OF WATER:  ? atorvastatin (LIPITOR) ? metoprolol tartrate (LOPRESSOR) ? ? Take these medicines if needed:  ? acetaminophen (TYLENOL)  ? nitroGLYCERIN (NITROSTAT)  ? Oxycodone  ?Please follow your surgeon's instructions regarding Aspirin. If you have not received instructions then please contact your surgeon's office for instructions.  ? ?As of today, STOP taking any Aleve, Naproxen, Ibuprofen, Motrin, Advil, Goody's, BC's, all herbal medications, fish oil, and all vitamins. ? ?WHAT DO I DO ABOUT MY DIABETES MEDICATION? ? ? ?Do not take oral diabetes medicines (pills) the morning of surgery. ? ?DO NOT TAKE JANUMET the morning of surgery.  ? ?The day of surgery, do not take other diabetes injectables, including Byetta (exenatide), Bydureon (exenatide ER), Victoza (liraglutide), or Trulicity (dulaglutide). ? ?HOW TO MANAGE YOUR DIABETES ?BEFORE AND AFTER SURGERY ? ?Why is it important to control my blood sugar before and after surgery? ?Improving blood sugar levels before and after surgery helps healing and can limit problems. ?A way of improving blood sugar control is eating a healthy diet by: ? Eating less sugar and carbohydrates ? Increasing activity/exercise ? Talking with your doctor about reaching your blood sugar goals ?High blood sugars (greater than 180 mg/dL) can raise your risk of infections and slow your recovery, so you will need to focus on controlling your diabetes during the weeks before surgery. ?Make sure that  the doctor who takes care of your diabetes knows about your planned surgery including the date and location. ? ?How do I manage my blood sugar before surgery? ?Check your blood sugar at least 4 times a day, starting 2 days before surgery, to make sure that the level is not too high or low. ? ?Check your blood sugar the morning of your surgery when you wake up and every 2 hours until you get to the Short Stay unit. ? ?If your blood sugar is less than 70 mg/dL, you will need to treat for low blood sugar: ?Do not take insulin. ?Treat a low blood sugar (less than 70 mg/dL) with ? cup of clear juice (cranberry or apple), 4 glucose tablets, OR glucose gel. ?Recheck blood sugar in 15 minutes after treatment (to make sure it is greater than 70 mg/dL). If your blood sugar is not greater than 70 mg/dL on recheck, call [phone] for further instructions. ?Report your blood sugar to the short stay nurse when you get to Short Stay. ? ?If you are admitted to the hospital after surgery: ?Your blood sugar will be checked by the staff and you will probably be given insulin after surgery (instead of oral diabetes medicines) to make sure you have good blood sugar levels. ?The goal for blood sugar control after surgery is 80-180 mg/dL.  ?         ?Do not wear jewelry or makeup ?Do not wear lotions, powders, perfumes/colognes, or deodorant. ?Do not shave 48 hours prior to surgery.  Men may shave face and neck. ?Do not bring valuables  to the hospital. ?Do not wear nail polish, gel polish, artificial nails, or any other type of covering on natural nails (fingers and toes) ?If you have artificial nails or gel coating that need to be removed by a nail salon, please have this removed prior to surgery. Artificial nails or gel coating may interfere with anesthesia's ability to adequately monitor your vital signs. ? ?[facility] is not responsible for any belongings or valuables. .  ? ?Do NOT Smoke (Tobacco/Vaping)  24 hours prior to your  procedure ? ?If you use a CPAP at night, you may bring your mask for your overnight stay. ?  ?Contacts, glasses, hearing aids, dentures or partials may not be worn into surgery, please bring cases for these belongings ?  ?For patients admitted to the hospital, discharge time will be determined by your treatment team. ?  ?Patients discharged the day of surgery will not be allowed to drive home, and someone needs to stay with them for 24 hours. ? ? ?SURGICAL WAITING ROOM VISITATION ?Patients having surgery or a procedure in a hospital may have two support people. ?Children under the age of 63 must have an adult with them who is not the patient. ?They may stay in the waiting area during the procedure and may switch out with other visitors. If the patient needs to stay at the hospital during part of their recovery, the visitor guidelines for inpatient rooms apply. ? ?Please refer to the [facility] website for the visitor guidelines for Inpatients (after your surgery is over and you are in a regular room).  ? ? ? ? ? ?Special instructions:   ? ?Oral Hygiene is also important to reduce your risk of infection.  Remember - BRUSH YOUR TEETH THE MORNING OF SURGERY WITH YOUR REGULAR TOOTHPASTE ? ? ?[facility]- Preparing For Surgery ? ?Before surgery, you can play an important role. Because skin is not sterile, your skin needs to be as free of germs as possible. You can reduce the number of germs on your skin by washing with CHG (chlorahexidine gluconate) Soap before surgery.  CHG is an antiseptic cleaner which kills germs and bonds with the skin to continue killing germs even after washing.   ? ? ?Please do not use if you have an allergy to CHG or antibacterial soaps. If your skin becomes reddened/irritated stop using the CHG.  ?Do not shave (including legs and underarms) for at least 48 hours prior to first CHG shower. It is OK to shave your face. ? ?Please follow these instructions carefully. ?  ? ? Shower the NIGHT BEFORE  SURGERY and the MORNING OF SURGERY with CHG Soap.  ? If you chose to wash your hair, wash your hair first as usual with your normal shampoo. After you shampoo, rinse your hair and body thoroughly to remove the shampoo.  Then [employer] and genitals (private parts) with your normal soap and rinse thoroughly to remove soap. ? ?After that Use CHG Soap as you would any other liquid soap. You can apply CHG directly to the skin and wash gently with a scrungie or a clean washcloth.  ? ?Apply the CHG Soap to your body ONLY FROM THE NECK DOWN.  Do not use on open wounds or open sores. Avoid contact with your eyes, ears, mouth and genitals (private parts). Wash Face and genitals (private parts)  with your normal soap.  ? ?Wash thoroughly, paying special attention to the area where your surgery will be performed. ? ?Thoroughly rinse  your body with warm water from the neck down. ? ?DO NOT shower/wash with your normal soap after using and rinsing off the CHG Soap. ? ?Pat yourself dry with a CLEAN TOWEL. ? ?Wear CLEAN PAJAMAS to bed the night before surgery ? ?Place CLEAN SHEETS on your bed the night before your surgery ? ?DO NOT SLEEP WITH PETS. ? ? ?Day of Surgery: ? ?Take a shower with CHG soap. ?Wear Clean/Comfortable clothing the morning of surgery ?Do not apply any deodorants/lotions.   ?Remember to brush your teeth WITH YOUR REGULAR TOOTHPASTE. ? ? ? ?If you received a COVID test during your pre-op visit, it is requested that you wear a mask when out in public, stay away from anyone that may not be feeling well, and notify your surgeon if you develop symptoms. If you have been in contact with anyone that has tested positive in the last 10 days, please notify your surgeon. ? ?  ?Please read over the following fact sheets that you were given.  ? ?

## 2021-12-07 ENCOUNTER — Other Ambulatory Visit: Payer: Self-pay

## 2021-12-07 ENCOUNTER — Encounter (HOSPITAL_COMMUNITY): Payer: Self-pay

## 2021-12-07 ENCOUNTER — Encounter (HOSPITAL_COMMUNITY)
Admission: RE | Admit: 2021-12-07 | Discharge: 2021-12-07 | Disposition: A | Discharge status: Home / Self Care | Payer: 59 | Source: Ambulatory Visit | Attending: Vascular Surgery | Admitting: Vascular Surgery

## 2021-12-07 VITALS — BP 132/52 | HR 79 | Temp 98.6°F | Resp 19 | Ht 64.0 in | Wt 164.3 lb

## 2021-12-07 DIAGNOSIS — I739 Peripheral vascular disease, unspecified: Secondary | ICD-10-CM | POA: Insufficient documentation

## 2021-12-07 DIAGNOSIS — I6521 Occlusion and stenosis of right carotid artery: Secondary | ICD-10-CM | POA: Insufficient documentation

## 2021-12-07 DIAGNOSIS — E119 Type 2 diabetes mellitus without complications: Secondary | ICD-10-CM | POA: Insufficient documentation

## 2021-12-07 DIAGNOSIS — E785 Hyperlipidemia, unspecified: Secondary | ICD-10-CM | POA: Insufficient documentation

## 2021-12-07 DIAGNOSIS — I252 Old myocardial infarction: Secondary | ICD-10-CM | POA: Insufficient documentation

## 2021-12-07 DIAGNOSIS — Z7901 Long term (current) use of anticoagulants: Secondary | ICD-10-CM | POA: Insufficient documentation

## 2021-12-07 DIAGNOSIS — F17210 Nicotine dependence, cigarettes, uncomplicated: Secondary | ICD-10-CM | POA: Insufficient documentation

## 2021-12-07 DIAGNOSIS — Z955 Presence of coronary angioplasty implant and graft: Secondary | ICD-10-CM | POA: Insufficient documentation

## 2021-12-07 DIAGNOSIS — Z01818 Encounter for other preprocedural examination: Secondary | ICD-10-CM

## 2021-12-07 DIAGNOSIS — D72829 Elevated white blood cell count, unspecified: Secondary | ICD-10-CM | POA: Insufficient documentation

## 2021-12-07 DIAGNOSIS — I701 Atherosclerosis of renal artery: Secondary | ICD-10-CM | POA: Insufficient documentation

## 2021-12-07 DIAGNOSIS — I10 Essential (primary) hypertension: Secondary | ICD-10-CM | POA: Insufficient documentation

## 2021-12-07 DIAGNOSIS — K900 Celiac disease: Secondary | ICD-10-CM | POA: Insufficient documentation

## 2021-12-07 DIAGNOSIS — I779 Disorder of arteries and arterioles, unspecified: Secondary | ICD-10-CM | POA: Insufficient documentation

## 2021-12-07 DIAGNOSIS — Z01812 Encounter for preprocedural laboratory examination: Secondary | ICD-10-CM | POA: Insufficient documentation

## 2021-12-07 DIAGNOSIS — K551 Chronic vascular disorders of intestine: Secondary | ICD-10-CM | POA: Insufficient documentation

## 2021-12-07 HISTORY — DX: Family history of other specified conditions: Z84.89

## 2021-12-07 LAB — COMPREHENSIVE METABOLIC PANEL
ALT: 21 U/L (ref 0–44)
AST: 23 U/L (ref 15–41)
Albumin: 4.1 g/dL (ref 3.5–5.0)
Alkaline Phosphatase: 36 U/L — ABNORMAL LOW (ref 38–126)
Anion gap: 6 (ref 5–15)
BUN: 10 mg/dL (ref 6–20)
CO2: 28 mmol/L (ref 22–32)
Calcium: 9.8 mg/dL (ref 8.9–10.3)
Chloride: 105 mmol/L (ref 98–111)
Creatinine, Ser: 0.7 mg/dL (ref 0.44–1.00)
GFR, Estimated: >60 mL/min (ref >60)
Glucose, Bld: 124 mg/dL — ABNORMAL HIGH (ref 70–99)
Potassium: 4.3 mmol/L (ref 3.5–5.1)
Sodium: 139 mmol/L (ref 135–145)
Total Bilirubin: 0.5 mg/dL (ref 0.3–1.2)
Total Protein: 7.1 g/dL (ref 6.5–8.1)

## 2021-12-07 LAB — CBC
HCT: 41.9 % (ref 36.0–46.0)
Hemoglobin: 13.5 g/dL (ref 12.0–15.0)
MCH: 30.1 pg (ref 26.0–34.0)
MCHC: 32.2 g/dL (ref 30.0–36.0)
MCV: 93.5 fL (ref 80.0–100.0)
Platelets: 294 10*3/uL (ref 150–400)
RBC: 4.48 MIL/uL (ref 3.87–5.11)
RDW: 14.2 % (ref 11.5–15.5)
WBC: 17.1 10*3/uL — ABNORMAL HIGH (ref 4.0–10.5)
nRBC: 0 % (ref 0.0–0.2)

## 2021-12-07 LAB — URINALYSIS, ROUTINE W REFLEX MICROSCOPIC
Bilirubin Urine: NEGATIVE
Glucose, UA: NEGATIVE mg/dL
Hgb urine dipstick: NEGATIVE
Ketones, ur: NEGATIVE mg/dL
Leukocytes,Ua: NEGATIVE
Nitrite: NEGATIVE
Protein, ur: NEGATIVE mg/dL
Specific Gravity, Urine: 1.012 (ref 1.005–1.030)
pH: 5 (ref 5.0–8.0)

## 2021-12-07 LAB — GLUCOSE, CAPILLARY: Glucose-Capillary: 164 mg/dL — ABNORMAL HIGH (ref 70–99)

## 2021-12-07 LAB — APTT: aPTT: 31 seconds (ref 24–36)

## 2021-12-07 LAB — HEMOGLOBIN A1C
Hgb A1c MFr Bld: 6.3 % — ABNORMAL HIGH (ref 4.8–5.6)
Mean Plasma Glucose: 134.11 mg/dL

## 2021-12-07 LAB — PROTIME-INR
INR: 1 (ref 0.8–1.2)
Prothrombin Time: 12.9 seconds (ref 11.4–15.2)

## 2021-12-07 LAB — SURGICAL PCR SCREEN
MRSA, PCR: NEGATIVE
Staphylococcus aureus: NEGATIVE

## 2021-12-07 NOTE — Progress Notes (Signed)
Received a call from the lab stating that patient's Type and Screen will have to be redrawn day of surgery.  ?

## 2021-12-07 NOTE — Progress Notes (Addendum)
PCP - Dr. Redmond School ?Cardiologist - Dr. Carlyle Dolly ? ?PPM/ICD - n/a ? ?Chest x-ray - n/a ?EKG - 09/15/21 ?Stress Test -  ?ECHO -  ?Cardiac Cath - 11/2010 ? ?Sleep Study - denies ?CPAP -  ? ?CBG today- 164 ?Checks Blood Sugar - Patient states she does not check her blood sugar.  ? ?Blood Thinner Instructions: n/a ?Aspirin Instructions: Please follow your surgeon's instructions regarding Aspirin. Patient states she was instructed to continue taking Aspirin. ? ?ERAS Protcol - No. NPO ? ?COVID TEST- n/a ? ? ?Anesthesia review: Yes. WBC  17.1. Cardiac History. Cardiac Clearance note 11/18/21.  ? ?Patient denies shortness of breath, fever, cough and chest pain at PAT appointment ? ? ?All instructions explained to the patient, with a verbal understanding of the material. Patient agrees to go over the instructions while at home for a better understanding. The opportunity to ask questions was provided. ? ? ?

## 2021-12-07 NOTE — Progress Notes (Signed)
Patient's CBC resulted and WBC is 17.1. Sigmund Hazel, PA with anesthesia made aware. Message also sent to N. Hollie Salk, RN and N. Pamella Pert, RN with Dr. [name] office per Sigmund Hazel, PA to make him aware. ?
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
## 2021-12-09 ENCOUNTER — Inpatient Hospital Stay (HOSPITAL_COMMUNITY)
Admission: RE | Admit: 2021-12-09 | Discharge: 2021-12-10 | DRG: 039 | Disposition: A | Discharge status: Home / Self Care | Payer: 59 | Attending: Vascular Surgery | Admitting: Vascular Surgery

## 2021-12-09 ENCOUNTER — Other Ambulatory Visit: Payer: Self-pay

## 2021-12-09 ENCOUNTER — Inpatient Hospital Stay (HOSPITAL_COMMUNITY): Payer: 59 | Admitting: Vascular Surgery

## 2021-12-09 ENCOUNTER — Encounter (HOSPITAL_COMMUNITY)
Admission: RE | Disposition: A | Discharge status: Home / Self Care | Payer: Self-pay | Source: Home / Self Care | Attending: Vascular Surgery

## 2021-12-09 ENCOUNTER — Encounter (HOSPITAL_COMMUNITY): Payer: Self-pay | Admitting: Vascular Surgery

## 2021-12-09 DIAGNOSIS — Z79899 Other long term (current) drug therapy: Secondary | ICD-10-CM

## 2021-12-09 DIAGNOSIS — Z7982 Long term (current) use of aspirin: Secondary | ICD-10-CM

## 2021-12-09 DIAGNOSIS — I2510 Atherosclerotic heart disease of native coronary artery without angina pectoris: Secondary | ICD-10-CM

## 2021-12-09 DIAGNOSIS — F419 Anxiety disorder, unspecified: Secondary | ICD-10-CM | POA: Diagnosis present

## 2021-12-09 DIAGNOSIS — E1151 Type 2 diabetes mellitus with diabetic peripheral angiopathy without gangrene: Secondary | ICD-10-CM | POA: Diagnosis present

## 2021-12-09 DIAGNOSIS — Z955 Presence of coronary angioplasty implant and graft: Secondary | ICD-10-CM | POA: Diagnosis not present

## 2021-12-09 DIAGNOSIS — Z90710 Acquired absence of both cervix and uterus: Secondary | ICD-10-CM | POA: Diagnosis not present

## 2021-12-09 DIAGNOSIS — E785 Hyperlipidemia, unspecified: Secondary | ICD-10-CM | POA: Diagnosis present

## 2021-12-09 DIAGNOSIS — I252 Old myocardial infarction: Secondary | ICD-10-CM

## 2021-12-09 DIAGNOSIS — Z8249 Family history of ischemic heart disease and other diseases of the circulatory system: Secondary | ICD-10-CM

## 2021-12-09 DIAGNOSIS — Z7984 Long term (current) use of oral hypoglycemic drugs: Secondary | ICD-10-CM | POA: Diagnosis not present

## 2021-12-09 DIAGNOSIS — I10 Essential (primary) hypertension: Secondary | ICD-10-CM | POA: Diagnosis present

## 2021-12-09 DIAGNOSIS — F17210 Nicotine dependence, cigarettes, uncomplicated: Secondary | ICD-10-CM | POA: Diagnosis present

## 2021-12-09 DIAGNOSIS — I6522 Occlusion and stenosis of left carotid artery: Secondary | ICD-10-CM

## 2021-12-09 DIAGNOSIS — Z86718 Personal history of other venous thrombosis and embolism: Secondary | ICD-10-CM | POA: Diagnosis not present

## 2021-12-09 DIAGNOSIS — I6529 Occlusion and stenosis of unspecified carotid artery: Secondary | ICD-10-CM | POA: Diagnosis present

## 2021-12-09 DIAGNOSIS — E119 Type 2 diabetes mellitus without complications: Secondary | ICD-10-CM

## 2021-12-09 HISTORY — PX: PATCH ANGIOPLASTY: SHX6230

## 2021-12-09 HISTORY — PX: ENDARTERECTOMY: SHX5162

## 2021-12-09 LAB — GLUCOSE, CAPILLARY
Glucose-Capillary: 104 mg/dL — ABNORMAL HIGH (ref 70–99)
Glucose-Capillary: 123 mg/dL — ABNORMAL HIGH (ref 70–99)
Glucose-Capillary: 148 mg/dL — ABNORMAL HIGH (ref 70–99)
Glucose-Capillary: 274 mg/dL — ABNORMAL HIGH (ref 70–99)
Glucose-Capillary: 168 mg/dL — ABNORMAL HIGH (ref 70–99)

## 2021-12-09 LAB — TYPE AND SCREEN
ABO/RH(D): A POS
Antibody Screen: NEGATIVE

## 2021-12-09 LAB — POCT ACTIVATED CLOTTING TIME
Activated Clotting Time: 293 seconds
Activated Clotting Time: 233 seconds

## 2021-12-09 SURGERY — ENDARTERECTOMY, CAROTID
Anesthesia: General | Site: Neck | Laterality: Left

## 2021-12-09 MED ORDER — OXYCODONE-ACETAMINOPHEN 5-325 MG PO TABS
1.0000 | ORAL_TABLET | ORAL | Status: DC | PRN
  Administered 2021-12-09 (×2): 2 via ORAL
  Filled 2021-12-09 (×2): qty 2

## 2021-12-09 MED ORDER — CHLORHEXIDINE GLUCONATE 0.12 % MT SOLN: 15.0000 mL | Freq: Once | OROMUCOSAL | Status: AC

## 2021-12-09 MED ORDER — PHENYLEPHRINE 80 MCG/ML (10ML) SYRINGE FOR IV PUSH (FOR BLOOD PRESSURE SUPPORT)
PREFILLED_SYRINGE | INTRAVENOUS | Status: DC | PRN
  Administered 2021-12-09 (×2): 80 ug via INTRAVENOUS

## 2021-12-09 MED ORDER — 0.9 % SODIUM CHLORIDE (POUR BTL) OPTIME
TOPICAL | Status: DC | PRN
  Administered 2021-12-09: 2000 mL

## 2021-12-09 MED ORDER — ONDANSETRON HCL 4 MG/2ML IJ SOLN
INTRAMUSCULAR | Status: DC | PRN
  Administered 2021-12-09: 4 mg via INTRAVENOUS

## 2021-12-09 MED ORDER — ATORVASTATIN CALCIUM 80 MG PO TABS
80.0000 mg | ORAL_TABLET | Freq: Every day | ORAL | Status: DC
  Administered 2021-12-10: 80 mg via ORAL
  Filled 2021-12-09: qty 1

## 2021-12-09 MED ORDER — PANTOPRAZOLE SODIUM 40 MG PO TBEC
40.0000 mg | DELAYED_RELEASE_TABLET | Freq: Every day | ORAL | Status: DC
  Administered 2021-12-09 – 2021-12-10 (×2): 40 mg via ORAL
  Filled 2021-12-09 (×2): qty 1

## 2021-12-09 MED ORDER — GUAIFENESIN-DM 100-10 MG/5ML PO SYRP: 15.0000 mL | ORAL_SOLUTION | ORAL | Status: DC | PRN

## 2021-12-09 MED ORDER — ONDANSETRON HCL 4 MG/2ML IJ SOLN
INTRAMUSCULAR | Status: AC
  Filled 2021-12-09: qty 2

## 2021-12-09 MED ORDER — ACETAMINOPHEN 500 MG PO TABS: 1000.0000 mg | ORAL_TABLET | Freq: Once | ORAL | Status: AC

## 2021-12-09 MED ORDER — PROTAMINE SULFATE 10 MG/ML IV SOLN
INTRAVENOUS | Status: DC | PRN
  Administered 2021-12-09: 50 mg via INTRAVENOUS

## 2021-12-09 MED ORDER — CHLORHEXIDINE GLUCONATE CLOTH 2 % EX PADS: 6.0000 | MEDICATED_PAD | Freq: Once | CUTANEOUS | Status: DC

## 2021-12-09 MED ORDER — MIDAZOLAM HCL 2 MG/2ML IJ SOLN
INTRAMUSCULAR | Status: AC
  Filled 2021-12-09: qty 2

## 2021-12-09 MED ORDER — LINAGLIPTIN 5 MG PO TABS
5.0000 mg | ORAL_TABLET | Freq: Every day | ORAL | Status: DC
Start: 2021-12-10 — End: 2021-12-10
  Administered 2021-12-10: 5 mg via ORAL
  Filled 2021-12-09: qty 1

## 2021-12-09 MED ORDER — PROPOFOL 500 MG/50ML IV EMUL
INTRAVENOUS | Status: DC | PRN
  Administered 2021-12-09: 50 ug/kg/min via INTRAVENOUS

## 2021-12-09 MED ORDER — SENNOSIDES-DOCUSATE SODIUM 8.6-50 MG PO TABS: 1.0000 | ORAL_TABLET | Freq: Every evening | ORAL | Status: DC | PRN

## 2021-12-09 MED ORDER — SUGAMMADEX SODIUM 200 MG/2ML IV SOLN
INTRAVENOUS | Status: DC | PRN
Start: 2021-12-09 — End: 2021-12-09
  Administered 2021-12-09: 147.8 mg via INTRAVENOUS

## 2021-12-09 MED ORDER — FENTANYL CITRATE (PF) 250 MCG/5ML IJ SOLN
INTRAMUSCULAR | Status: AC
  Filled 2021-12-09: qty 5

## 2021-12-09 MED ORDER — POTASSIUM CHLORIDE CRYS ER 20 MEQ PO TBCR: 20.0000 meq | EXTENDED_RELEASE_TABLET | Freq: Every day | ORAL | Status: DC | PRN

## 2021-12-09 MED ORDER — SODIUM CHLORIDE 0.9% FLUSH
3.0000 mL | INTRAVENOUS | Status: DC | PRN
Start: 2021-12-09 — End: 2021-12-10

## 2021-12-09 MED ORDER — LIDOCAINE 2% (20 MG/ML) 5 ML SYRINGE
INTRAMUSCULAR | Status: AC
  Filled 2021-12-09: qty 5

## 2021-12-09 MED ORDER — HEPARIN 6000 UNIT IRRIGATION SOLUTION
Status: AC
  Filled 2021-12-09: qty 500

## 2021-12-09 MED ORDER — DEXTRAN 40 IN SALINE 10-0.9 % IV SOLN
INTRAVENOUS | Status: AC | PRN
  Administered 2021-12-09: 500 mL

## 2021-12-09 MED ORDER — LACTATED RINGERS IV SOLN: INTRAVENOUS | Status: DC

## 2021-12-09 MED ORDER — ASPIRIN 81 MG PO TBEC
81.0000 mg | DELAYED_RELEASE_TABLET | Freq: Every day | ORAL | Status: DC
  Administered 2021-12-10: 81 mg via ORAL
  Filled 2021-12-09: qty 1

## 2021-12-09 MED ORDER — HYDROMORPHONE HCL 1 MG/ML IJ SOLN: 0.5000 mg | INTRAMUSCULAR | Status: DC | PRN

## 2021-12-09 MED ORDER — HEPARIN SODIUM (PORCINE) 1000 UNIT/ML IJ SOLN
INTRAMUSCULAR | Status: DC | PRN
  Administered 2021-12-09: 9000 [IU] via INTRAVENOUS

## 2021-12-09 MED ORDER — METOPROLOL TARTRATE 5 MG/5ML IV SOLN: 2.0000 mg | INTRAVENOUS | Status: DC | PRN

## 2021-12-09 MED ORDER — DEXAMETHASONE SODIUM PHOSPHATE 10 MG/ML IJ SOLN
INTRAMUSCULAR | Status: AC
  Filled 2021-12-09: qty 1

## 2021-12-09 MED ORDER — OXYCODONE HCL 5 MG/5ML PO SOLN: 5.0000 mg | Freq: Once | ORAL | Status: DC | PRN

## 2021-12-09 MED ORDER — FENTANYL CITRATE (PF) 250 MCG/5ML IJ SOLN
INTRAMUSCULAR | Status: AC
Start: 2021-12-09 — End: ?
  Filled 2021-12-09: qty 5

## 2021-12-09 MED ORDER — DEXAMETHASONE SODIUM PHOSPHATE 10 MG/ML IJ SOLN
INTRAMUSCULAR | Status: DC | PRN
  Administered 2021-12-09: 8 mg via INTRAVENOUS

## 2021-12-09 MED ORDER — OXYCODONE HCL 5 MG PO TABS: 5.0000 mg | ORAL_TABLET | Freq: Once | ORAL | Status: DC | PRN

## 2021-12-09 MED ORDER — LIDOCAINE HCL (PF) 1 % IJ SOLN
INTRAMUSCULAR | Status: AC
  Filled 2021-12-09: qty 30

## 2021-12-09 MED ORDER — HYDROMORPHONE HCL 1 MG/ML IJ SOLN
0.2500 mg | INTRAMUSCULAR | Status: DC | PRN
  Administered 2021-12-09 (×2): 0.5 mg via INTRAVENOUS

## 2021-12-09 MED ORDER — SODIUM CHLORIDE 0.9% FLUSH
3.0000 mL | Freq: Two times a day (BID) | INTRAVENOUS | Status: DC
  Administered 2021-12-09: 3 mL via INTRAVENOUS

## 2021-12-09 MED ORDER — VANCOMYCIN HCL IN DEXTROSE 1-5 GM/200ML-% IV SOLN
INTRAVENOUS | Status: AC
  Filled 2021-12-09: qty 200

## 2021-12-09 MED ORDER — ACETAMINOPHEN 500 MG PO TABS
ORAL_TABLET | ORAL | Status: AC
  Administered 2021-12-09: 1000 mg via ORAL
  Filled 2021-12-09: qty 2

## 2021-12-09 MED ORDER — VANCOMYCIN HCL IN DEXTROSE 1-5 GM/200ML-% IV SOLN
1000.0000 mg | Freq: Two times a day (BID) | INTRAVENOUS | Status: AC
  Administered 2021-12-09 – 2021-12-10 (×2): 1000 mg via INTRAVENOUS
  Filled 2021-12-09 (×2): qty 200

## 2021-12-09 MED ORDER — FENTANYL CITRATE (PF) 250 MCG/5ML IJ SOLN
INTRAMUSCULAR | Status: DC | PRN
  Administered 2021-12-09: 50 ug via INTRAVENOUS
  Administered 2021-12-09: 200 ug via INTRAVENOUS
  Administered 2021-12-09: 50 ug via INTRAVENOUS

## 2021-12-09 MED ORDER — MEPERIDINE HCL 25 MG/ML IJ SOLN: 6.2500 mg | INTRAMUSCULAR | Status: DC | PRN

## 2021-12-09 MED ORDER — HEPARIN SODIUM (PORCINE) 5000 UNIT/ML IJ SOLN: 5000.0000 [IU] | Freq: Three times a day (TID) | INTRAMUSCULAR | Status: DC

## 2021-12-09 MED ORDER — ACETAMINOPHEN 10 MG/ML IV SOLN
1000.0000 mg | Freq: Four times a day (QID) | INTRAVENOUS | Status: AC
  Filled 2021-12-09: qty 100

## 2021-12-09 MED ORDER — ROCURONIUM BROMIDE 10 MG/ML (PF) SYRINGE
PREFILLED_SYRINGE | INTRAVENOUS | Status: AC
  Filled 2021-12-09: qty 20

## 2021-12-09 MED ORDER — ONDANSETRON HCL 4 MG/2ML IJ SOLN: 4.0000 mg | Freq: Four times a day (QID) | INTRAMUSCULAR | Status: DC | PRN

## 2021-12-09 MED ORDER — SODIUM CHLORIDE 0.9 % IV SOLN: INTRAVENOUS | Status: DC

## 2021-12-09 MED ORDER — INSULIN ASPART 100 UNIT/ML IJ SOLN
0.0000 [IU] | Freq: Three times a day (TID) | INTRAMUSCULAR | Status: DC
  Administered 2021-12-09: 8 [IU] via SUBCUTANEOUS
  Administered 2021-12-10: 5 [IU] via SUBCUTANEOUS

## 2021-12-09 MED ORDER — ACETAMINOPHEN 325 MG PO TABS: 325.0000 mg | ORAL_TABLET | ORAL | Status: DC | PRN

## 2021-12-09 MED ORDER — BISACODYL 10 MG RE SUPP: 10.0000 mg | Freq: Every day | RECTAL | Status: DC | PRN

## 2021-12-09 MED ORDER — HEMOSTATIC AGENTS (NO CHARGE) OPTIME
TOPICAL | Status: DC | PRN
  Administered 2021-12-09: 1 via TOPICAL

## 2021-12-09 MED ORDER — SODIUM CHLORIDE 0.9 % IV SOLN: 500.0000 mL | Freq: Once | INTRAVENOUS | Status: DC | PRN

## 2021-12-09 MED ORDER — ORAL CARE MOUTH RINSE: 15.0000 mL | Freq: Once | OROMUCOSAL | Status: AC

## 2021-12-09 MED ORDER — HYDRALAZINE HCL 20 MG/ML IJ SOLN: 5.0000 mg | INTRAMUSCULAR | Status: DC | PRN

## 2021-12-09 MED ORDER — ROCURONIUM BROMIDE 10 MG/ML (PF) SYRINGE
PREFILLED_SYRINGE | INTRAVENOUS | Status: DC | PRN
  Administered 2021-12-09: 60 mg via INTRAVENOUS

## 2021-12-09 MED ORDER — LABETALOL HCL 5 MG/ML IV SOLN: 10.0000 mg | INTRAVENOUS | Status: DC | PRN

## 2021-12-09 MED ORDER — HYDROMORPHONE HCL 1 MG/ML IJ SOLN
INTRAMUSCULAR | Status: AC
  Filled 2021-12-09: qty 1

## 2021-12-09 MED ORDER — METOPROLOL TARTRATE 25 MG PO TABS
25.0000 mg | ORAL_TABLET | Freq: Two times a day (BID) | ORAL | Status: DC
Start: 2021-12-09 — End: 2021-12-10
  Administered 2021-12-09 – 2021-12-10 (×2): 25 mg via ORAL
  Filled 2021-12-09 (×2): qty 1

## 2021-12-09 MED ORDER — SODIUM CHLORIDE 0.9 % IV SOLN
250.0000 mL | INTRAVENOUS | Status: DC | PRN
Start: 2021-12-09 — End: 2021-12-10

## 2021-12-09 MED ORDER — SODIUM CHLORIDE 0.9 % IV SOLN: INTRAVENOUS | Status: DC | PRN

## 2021-12-09 MED ORDER — PHENOL 1.4 % MT LIQD: 1.0000 | OROMUCOSAL | Status: DC | PRN

## 2021-12-09 MED ORDER — BENAZEPRIL HCL 5 MG PO TABS
10.0000 mg | ORAL_TABLET | Freq: Every day | ORAL | Status: DC
  Administered 2021-12-09 – 2021-12-10 (×2): 10 mg via ORAL
  Filled 2021-12-09 (×2): qty 2

## 2021-12-09 MED ORDER — LIDOCAINE 2% (20 MG/ML) 5 ML SYRINGE
INTRAMUSCULAR | Status: DC | PRN
  Administered 2021-12-09: 40 mg via INTRAVENOUS

## 2021-12-09 MED ORDER — ALPRAZOLAM 0.5 MG PO TABS
1.0000 mg | ORAL_TABLET | Freq: Every day | ORAL | Status: DC
  Administered 2021-12-09: 1 mg via ORAL
  Filled 2021-12-09: qty 2

## 2021-12-09 MED ORDER — RIMEGEPANT SULFATE 75 MG PO TBDP
75.0000 mg | ORAL_TABLET | Freq: Every day | ORAL | Status: DC | PRN
  Administered 2021-12-09: 75 mg via ORAL
  Filled 2021-12-09: qty 75

## 2021-12-09 MED ORDER — ALUM & MAG HYDROXIDE-SIMETH 200-200-20 MG/5ML PO SUSP: 15.0000 mL | ORAL | Status: DC | PRN

## 2021-12-09 MED ORDER — SITAGLIPTIN PHOS-METFORMIN HCL 50-1000 MG PO TABS: 1.0000 | ORAL_TABLET | Freq: Two times a day (BID) | ORAL | Status: DC

## 2021-12-09 MED ORDER — MAGNESIUM SULFATE 2 GM/50ML IV SOLN: 2.0000 g | Freq: Every day | INTRAVENOUS | Status: DC | PRN

## 2021-12-09 MED ORDER — OMEGA-3 1000 MG PO CAPS: 3000.0000 mg | ORAL_CAPSULE | Freq: Every day | ORAL | Status: DC

## 2021-12-09 MED ORDER — MIDAZOLAM HCL 2 MG/2ML IJ SOLN: 0.5000 mg | Freq: Once | INTRAMUSCULAR | Status: DC | PRN

## 2021-12-09 MED ORDER — HEPARIN SODIUM (PORCINE) 1000 UNIT/ML IJ SOLN
INTRAMUSCULAR | Status: AC
  Filled 2021-12-09: qty 10

## 2021-12-09 MED ORDER — METFORMIN HCL 500 MG PO TABS
1000.0000 mg | ORAL_TABLET | Freq: Two times a day (BID) | ORAL | Status: DC
  Administered 2021-12-09 – 2021-12-10 (×2): 1000 mg via ORAL
  Filled 2021-12-09 (×2): qty 2

## 2021-12-09 MED ORDER — PHENYLEPHRINE HCL-NACL 20-0.9 MG/250ML-% IV SOLN
INTRAVENOUS | Status: DC | PRN
  Administered 2021-12-09: 25 ug/min via INTRAVENOUS

## 2021-12-09 MED ORDER — SUMATRIPTAN SUCCINATE 25 MG PO TABS: 25.0000 mg | ORAL_TABLET | Freq: Every day | ORAL | Status: DC | PRN

## 2021-12-09 MED ORDER — INSULIN ASPART 100 UNIT/ML IJ SOLN: 0.0000 [IU] | INTRAMUSCULAR | Status: DC | PRN

## 2021-12-09 MED ORDER — VANCOMYCIN HCL IN DEXTROSE 1-5 GM/200ML-% IV SOLN
1000.0000 mg | INTRAVENOUS | Status: AC
Start: 2021-12-09 — End: 2021-12-09
  Administered 2021-12-09: 1000 mg via INTRAVENOUS

## 2021-12-09 MED ORDER — ACETAMINOPHEN 650 MG RE SUPP: 325.0000 mg | RECTAL | Status: DC | PRN

## 2021-12-09 MED ORDER — PROPOFOL 10 MG/ML IV BOLUS
INTRAVENOUS | Status: DC | PRN
  Administered 2021-12-09: 150 mg via INTRAVENOUS

## 2021-12-09 MED ORDER — HEPARIN 6000 UNIT IRRIGATION SOLUTION
Status: DC | PRN
  Administered 2021-12-09: 1

## 2021-12-09 MED ORDER — CHLORHEXIDINE GLUCONATE 0.12 % MT SOLN
OROMUCOSAL | Status: AC
  Administered 2021-12-09: 15 mL via OROMUCOSAL
  Filled 2021-12-09: qty 15

## 2021-12-09 MED ORDER — DOCUSATE SODIUM 100 MG PO CAPS
100.0000 mg | ORAL_CAPSULE | Freq: Every day | ORAL | Status: DC
  Administered 2021-12-10: 100 mg via ORAL
  Filled 2021-12-09: qty 1

## 2021-12-09 SURGICAL SUPPLY — 52 items
BAG COUNTER SPONGE SURGICOUNT (BAG) ×2 IMPLANT
BAG DECANTER FOR FLEXI CONT (MISCELLANEOUS) ×2 IMPLANT
CANISTER SUCT 3000ML PPV (MISCELLANEOUS) ×2 IMPLANT
CANNULA VESSEL 3MM 2 BLNT TIP (CANNULA) ×1 IMPLANT
CATH ROBINSON RED A/P 18FR (CATHETERS) ×2 IMPLANT
CLIP LIGATING EXTRA MED SLVR (CLIP) ×2 IMPLANT
CLIP LIGATING EXTRA SM BLUE (MISCELLANEOUS) ×2 IMPLANT
COVER PROBE W GEL 5X96 (DRAPES) ×2 IMPLANT
DERMABOND ADVANCED (GAUZE/BANDAGES/DRESSINGS) ×1
DERMABOND ADVANCED .7 DNX12 (GAUZE/BANDAGES/DRESSINGS) ×1 IMPLANT
DRAIN CHANNEL 15F RND FF W/TCR (WOUND CARE) IMPLANT
ELECT REM PT RETURN 9FT ADLT (ELECTROSURGICAL) ×2
ELECTRODE REM PT RTRN 9FT ADLT (ELECTROSURGICAL) ×1 IMPLANT
EVACUATOR SILICONE 100CC (DRAIN) IMPLANT
GAUZE 4X4 16PLY ~~LOC~~+RFID DBL (SPONGE) ×1 IMPLANT
GLOVE BIO SURGEON STRL SZ7.5 (GLOVE) ×3 IMPLANT
GOWN STRL REUS W/ TWL LRG LVL3 (GOWN DISPOSABLE) ×2 IMPLANT
GOWN STRL REUS W/ TWL XL LVL3 (GOWN DISPOSABLE) ×1 IMPLANT
GOWN STRL REUS W/TWL LRG LVL3 (GOWN DISPOSABLE) ×8
GOWN STRL REUS W/TWL XL LVL3 (GOWN DISPOSABLE) ×4
HEMOSTAT SNOW SURGICEL 2X4 (HEMOSTASIS) IMPLANT
INSERT FOGARTY SM (MISCELLANEOUS) ×1 IMPLANT
IV ADAPTER SYR DOUBLE MALE LL (MISCELLANEOUS) IMPLANT
KIT BASIN OR (CUSTOM PROCEDURE TRAY) ×2 IMPLANT
KIT SHUNT ARGYLE CAROTID ART 6 (VASCULAR PRODUCTS) ×3 IMPLANT
KIT TURNOVER KIT B (KITS) ×2 IMPLANT
NDL HYPO 25GX1X1/2 BEV (NEEDLE) IMPLANT
NDL SPNL 20GX3.5 QUINCKE YW (NEEDLE) IMPLANT
NEEDLE HYPO 25GX1X1/2 BEV (NEEDLE) IMPLANT
NEEDLE SPNL 20GX3.5 QUINCKE YW (NEEDLE) IMPLANT
NS IRRIG 1000ML POUR BTL (IV SOLUTION) ×6 IMPLANT
PACK CAROTID (CUSTOM PROCEDURE TRAY) ×2 IMPLANT
PAD ARMBOARD 7.5X6 YLW CONV (MISCELLANEOUS) ×4 IMPLANT
PATCH VASC XENOSURE 1CMX6CM (Vascular Products) ×2 IMPLANT
PATCH VASC XENOSURE 1X6 (Vascular Products) IMPLANT
POSITIONER HEAD DONUT 9IN (MISCELLANEOUS) ×2 IMPLANT
POWDER SURGICEL 3.0 GRAM (HEMOSTASIS) ×1 IMPLANT
SPONGE T-LAP 18X18 ~~LOC~~+RFID (SPONGE) ×1 IMPLANT
STOPCOCK 4 WAY LG BORE MALE ST (IV SETS) IMPLANT
SUT ETHILON 3 0 PS 1 (SUTURE) IMPLANT
SUT MNCRL AB 4-0 PS2 18 (SUTURE) ×2 IMPLANT
SUT PROLENE 6 0 BV (SUTURE) ×2 IMPLANT
SUT PROLENE 7 0 BV 1 (SUTURE) ×2 IMPLANT
SUT SILK 3 0 (SUTURE)
SUT SILK 3-0 18XBRD TIE 12 (SUTURE) IMPLANT
SUT VIC AB 3-0 SH 27 (SUTURE) ×2
SUT VIC AB 3-0 SH 27X BRD (SUTURE) ×1 IMPLANT
SYR 20CC LL (SYRINGE) ×1 IMPLANT
SYR CONTROL 10ML LL (SYRINGE) IMPLANT
TOWEL GREEN STERILE (TOWEL DISPOSABLE) ×2 IMPLANT
TUBING ART PRESS 48 MALE/FEM (TUBING) IMPLANT
WATER STERILE IRR 1000ML POUR (IV SOLUTION) ×2 IMPLANT

## 2021-12-09 NOTE — Interval H&P Note (Signed)
History and Physical Interval Note:  12/09/2021 7:15 AM  [name]  has presented today for surgery, with the diagnosis of LEFT CAROTID STENOSIS.  The various methods of treatment have been discussed with the patient and family. After consideration of risks, benefits and other options for treatment, the patient has consented to  Procedure(s): LEFT ENDARTERECTOMY CAROTID (Left) as a surgical intervention.  The patient's history has been reviewed, patient examined, no change in status, stable for surgery.  I have reviewed the patient's chart and labs.  Questions were answered to the patient's satisfaction.     Servando Snare

## 2021-12-09 NOTE — Progress Notes (Signed)
Pt admitted to [facility] from PACU.  Pt is A&O X4 and neuro intact.  Pt placed on telemtry and CCMD notified.  L carotid incision is Clean, dry, and intact with no signs of hematoma or bleeding.  P t is oriented to room with call light in reach.   12/09/21 1156  Vitals  Temp 97.6 F (36.4 C)  Temp Source Oral  BP (!) 117/55  MAP (mmHg) 73  BP Location Left Arm  BP Method Automatic  Patient Position (if appropriate) Lying  Pulse Rate 62  Pulse Rate Source Monitor  ECG Heart Rate 63  Resp 17  Level of Consciousness  Level of Consciousness Alert  Oxygen Therapy  SpO2 96 %  O2 Device Nasal Cannula  O2 Flow Rate (L/min) 4 L/min  Pulse Oximetry Type Continuous  Art Line  Arterial Line BP 110/51  Arterial Line MAP (mmHg) 72 mmHg  Arterial Line Location Right radial  Art Line Wave Form Appropriate wave forms  Pain Assessment  Pain Scale 0-10  Pain Score 0  POSS Scale ([place] Opioid Sedation Scale)  POSS *See Group Information* 1-Acceptable,Awake and alert  Glasgow Coma Scale  Eye Opening 4  Best Verbal Response (NON-intubated) 5  Best Motor Response 6  Glasgow Coma Scale Score 15  MEWS Score  MEWS Temp 0  MEWS Systolic 0  MEWS Pulse 0  MEWS RR 0  MEWS LOC 0  MEWS Score 0  MEWS Score Color Green

## 2021-12-09 NOTE — Op Note (Signed)
Patient name: [name] MRN: [id number] DOB: February 13, 1966 Sex: female  12/09/2021 Pre-operative Diagnosis: asymptomatic left ica stenosis Post-operative diagnosis:  Same Surgeon:  Erlene Quan C. Donzetta Matters, MD Assistant: Paulo Fruit, PA Procedure Performed:  Left carotid endarterectomy with bovine pericardial patdch angioplasty  Indications: 56 year old female with known history of peripheral arterial disease and left ICA stenosis which I have followed for several years.  She now has over 80% stenosis by duplex and 65% stenosis by CTA and we have discussed proceeding with endarterectomy to reduce her risk of stroke in the future.  Findings: The common carotid artery did have thickened plaque posteriorly requiring the incision to be extended proximally.  The carotid bifurcation was actually quite low in the hypoglossal nerve and vagus nerves were easily identified and protected.  Distally I did place 2 tacking sutures to prevent the posterior intima from raising up after endarterectomy and angioplasty.  I prepared a shunt but elected not to place it as there was strong pulsatile backbleeding from the distal ICA and I was concerned about the proximal common carotid artery stenosis being disrupted by patch.  At completion there was a very strong signal in the distal ICA that was low resistance and the patient was neurologically intact upon awakening from anesthesia.   Procedure:  The patient was identified in the holding area and taken to the operating room where she was placed upon operative table and general anesthesia was induced.  She was to the prepped draped in the left neck and chest in usual fashion, antibiotics were administered and timeout was called.  We made an incision along the anterior border sternocleidomastoid dissected down and retracted the sternocleidomastoid laterally.  The common carotid artery was identified the vagus nerve was just on top of this.  I placed an umbilical tape around the  common carotid artery and the patient was fully heparinized and ACT initially returned nearly 300.  We dissected up to the carotid bifurcation actually divided the superior thyroidal branch for better exposure and placed a vessel loop around the external carotid artery.  The vagus nerve was traced higher and protected.  The ansa cervicalis was identified as was the hypoglossal and these were all protected.  Approximately 3 cm above the bifurcation the ICA appeared normal I placed a vessel loop around this.  I prepared a 10 [place] shunt.  The pressure was raised to a map of 90.  After ACT was confirmed I clamped the ICA followed by the common carotid artery followed by the external carotid artery.  The vessel was then opened longitudinally.  I checked backbleeding from the distal ICA and it was very strong.  I initially planned to place a shunt but given that there was a proximal plaque in the common carotid artery I elected not to place a shunt particularly given the pulsatile backbleeding.  I did have to extend the incision proximally down to the common carotid to gain approximately 5 more centimeters of exposure I then opened the common carotid further and was nearly back to normal appearing common carotid artery.  I performed endarterectomy including eversion of the external carotid artery.  Distally I did have good feathering but I was somewhat concerned that would be raising up of the plaque after patch angioplasty and so this was tacked with 2 interrupted 7-0 Prolene sutures.  We then performed patch angioplasty with bovine pericardium.  Prior completion of flushing all directions and thoroughly irrigated the carotid bulb with heparinized saline.  Upon  completion of the least the clamps from the external carotid artery followed by the common carotid artery and after several cardiac cycles the internal carotid artery clamp was removed.  There was very good signal that appeared low resistance in the distal ICA.   50 mg of protamine was administered.  We thoroughly irrigated the wound and meticulously obtained hemostasis.  After this we then closed the platysma with Vicryl and the skin with Monocryl and Dermabond is placed at the skin level.  The patient was then awakened from anesthesia she was noted to be neurologically intact.  She was transferred recovery area in stable condition.  All counts were correct at completion.   Given the complexity of the case,  the assistant was necessary in order to expedient the procedure and safely perform the technical aspects of the operation.  The assistant provided traction and countertraction to assist with exposure of the common carotid artery, external carotid artery, and internal carotid artery.  They also assisted with suture ligatures and dividing the facial vein and multiple small venous branches tethering the hypoglossal nerve.  In addition they were necessary to provide adequate traction and countertraction to perform a precise endarterectomy and precise closure.  These skills, especially following the Prolene suture for the anastomosis, could not have been adequately performed by a scrub tech assistant.    EBL: 50cc  Mckenzie Toruno C. Donzetta Matters, MD [facility] Office: [phone] Pager: [phone]

## 2021-12-09 NOTE — Progress Notes (Signed)
  Day of Surgery Note    Subjective:  c/o headache.  Says she has hx of migraine   Vitals:   12/09/21 1015 12/09/21 1030  BP: (!) 127/45 (!) 105/48  Pulse: 77 70  Resp: 19 (!) 21  Temp:    SpO2: 94% 94%    Incisions:   clean an dry Neuro:  in tact; moving all extremities; tongue midline Lungs:  non labored   Assessment/Plan:  This is a 56 y.o. female who is s/p  Left carotid endarterectomy  -pt with headache in pacu, otherwise, neuro in tact.   Discussed with Dr. Donzetta Matters - will give a dose of IV Tylenol. -to [place] later today   [employer], PA-C 12/09/2021 10:45 AM [phone]

## 2021-12-09 NOTE — Addendum Note (Signed)
Addendum  created 12/09/21 1048 by Minerva Ends, CRNA   LDA properties accepted

## 2021-12-09 NOTE — Transfer of Care (Signed)
Immediate Anesthesia Transfer of Care Note  Patient: [name]  Procedure(s) Performed: LEFT ENDARTERECTOMY CAROTID (Left: Neck) WITH 1 CM X 6 CM XENOSURE PATCH ANGIOPLASTY (Left: Neck)  Patient Location: PACU  Anesthesia Type:General  Level of Consciousness: awake, alert  and oriented  Airway & Oxygen Therapy: Patient Spontanous Breathing and Patient connected to nasal cannula oxygen  Post-op Assessment: Report given to RN and Post -op Vital signs reviewed and stable  Post vital signs: Reviewed and stable  Last Vitals:  Vitals Value Taken Time  BP 134/61 12/09/21 0959  Temp    Pulse 79 12/09/21 1003  Resp 17 12/09/21 1003  SpO2 94 % 12/09/21 1003  Vitals shown include unvalidated device data.  Last Pain:  Vitals:   12/09/21 0606  TempSrc:   PainSc: 0-No pain      Patients Stated Pain Goal: 0 (29/03/79 5583)  Complications: No notable events documented.

## 2021-12-09 NOTE — Anesthesia Procedure Notes (Signed)
Arterial Line Insertion Start/End5/19/2023 7:05 AM, 12/09/2021 7:27 AM Performed by: Annye Asa, MD, Minerva Ends, CRNA, CRNA  Patient location: Pre-op. Preanesthetic checklist: patient identified, IV checked, risks and benefits discussed, surgical consent, monitors and equipment checked, pre-op evaluation, timeout performed and anesthesia consent Lidocaine 1% used for infiltration Right, radial was placed Catheter size: 20 G Hand hygiene performed , maximum sterile barriers used  and Seldinger technique used Allen's test indicative of satisfactory collateral circulation Attempts: 2 Procedure performed using ultrasound guided technique. Ultrasound Notes:anatomy identified, needle tip was noted to be adjacent to the nerve/plexus identified and no ultrasound evidence of intravascular and/or intraneural injection Following insertion, dressing applied and Biopatch. Post procedure assessment: normal  Post procedure complications: second provider assisted. Patient tolerated the procedure well with no immediate complications. Additional procedure comments: CRNA unsuccessful L radial, MD successful R radial.

## 2021-12-09 NOTE — Anesthesia Postprocedure Evaluation (Signed)
Anesthesia Post Note  Patient: RIAH KEHOE  Procedure(s) Performed: LEFT ENDARTERECTOMY CAROTID (Left: Neck) WITH 1 CM X 6 CM XENOSURE PATCH ANGIOPLASTY (Left: Neck)     Patient location during evaluation: PACU Anesthesia Type: General Level of consciousness: awake and alert, patient cooperative and oriented Pain management: pain level controlled Vital Signs Assessment: post-procedure vital signs reviewed and stable Respiratory status: spontaneous breathing, nonlabored ventilation and respiratory function stable Cardiovascular status: blood pressure returned to baseline and stable Postop Assessment: no apparent nausea or vomiting Anesthetic complications: no Comments: C/o headache   No notable events documented.  Last Vitals:  Vitals:   12/09/21 1015 12/09/21 1030  BP: (!) 127/45 (!) 105/48  Pulse: 77 70  Resp: 19 (!) 21  Temp:    SpO2: 94% 94%    Last Pain:  Vitals:   12/09/21 1030  TempSrc:   PainSc: 5     LLE Motor Response: Purposeful movement;Responds to commands (12/09/21 1030) LLE Sensation: Full sensation;No numbness;No tingling (12/09/21 1030) RLE Motor Response: Purposeful movement;Responds to commands (12/09/21 1030) RLE Sensation: Full sensation;No numbness;No tingling (12/09/21 1030)      Earlee Herald,E. Nneka Blanda

## 2021-12-09 NOTE — Anesthesia Procedure Notes (Signed)
Procedure Name: Intubation Date/Time: 12/09/2021 8:00 AM Performed by: Minerva Ends, CRNA Pre-anesthesia Checklist: Patient identified, Emergency Drugs available, Suction available and Patient being monitored Patient Re-evaluated:Patient Re-evaluated prior to induction Oxygen Delivery Method: Circle system utilized Preoxygenation: Pre-oxygenation with 100% oxygen Induction Type: IV induction Ventilation: Mask ventilation without difficulty Laryngoscope Size: Mac and 3 Grade View: Grade I Tube type: Oral Tube size: 7.0 mm Number of attempts: 1 Airway Equipment and Method: Stylet and Oral airway Placement Confirmation: ETT inserted through vocal cords under direct vision, positive ETCO2 and breath sounds checked- equal and bilateral Secured at: 23 cm Tube secured with: Tape Dental Injury: Teeth and Oropharynx as per pre-operative assessment

## 2021-12-10 LAB — GLUCOSE, CAPILLARY: Glucose-Capillary: 234 mg/dL — ABNORMAL HIGH (ref 70–99)

## 2021-12-10 LAB — LIPID PANEL
Cholesterol: 99 mg/dL (ref 0–200)
HDL: 25 mg/dL — ABNORMAL LOW (ref >40)
LDL Cholesterol: 48 mg/dL (ref 0–99)
Total CHOL/HDL Ratio: 4 RATIO
Triglycerides: 129 mg/dL (ref <150)
VLDL: 26 mg/dL (ref 0–40)

## 2021-12-10 LAB — CBC
HCT: 34.1 % — ABNORMAL LOW (ref 36.0–46.0)
Hemoglobin: 11.2 g/dL — ABNORMAL LOW (ref 12.0–15.0)
MCH: 30.7 pg (ref 26.0–34.0)
MCHC: 32.8 g/dL (ref 30.0–36.0)
MCV: 93.4 fL (ref 80.0–100.0)
Platelets: 219 10*3/uL (ref 150–400)
RBC: 3.65 MIL/uL — ABNORMAL LOW (ref 3.87–5.11)
RDW: 13.8 % (ref 11.5–15.5)
WBC: 20.2 10*3/uL — ABNORMAL HIGH (ref 4.0–10.5)
nRBC: 0 % (ref 0.0–0.2)

## 2021-12-10 LAB — BASIC METABOLIC PANEL
Anion gap: 6 (ref 5–15)
BUN: 13 mg/dL (ref 6–20)
CO2: 24 mmol/L (ref 22–32)
Calcium: 8.2 mg/dL — ABNORMAL LOW (ref 8.9–10.3)
Chloride: 110 mmol/L (ref 98–111)
Creatinine, Ser: 0.65 mg/dL (ref 0.44–1.00)
GFR, Estimated: >60 mL/min (ref >60)
Glucose, Bld: 131 mg/dL — ABNORMAL HIGH (ref 70–99)
Potassium: 4.2 mmol/L (ref 3.5–5.1)
Sodium: 140 mmol/L (ref 135–145)

## 2021-12-10 MED ORDER — OXYCODONE-ACETAMINOPHEN 5-325 MG PO TABS: 1.0000 | ORAL_TABLET | Freq: Four times a day (QID) | ORAL | 0 refills | Status: DC | PRN

## 2021-12-10 NOTE — Progress Notes (Signed)
Pt ambulated x 470 feet around unit with front wheel walker, pt tolerated well
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
## 2021-12-10 NOTE — Discharge Instructions (Signed)
   [facility]  Discharge Instructions   Carotid Surgery  Please refer to the following instructions for your post-procedure care. Your surgeon or physician assistant will discuss any changes with you.  Activity  You are encouraged to walk as much as you can. You can slowly return to normal activities but must avoid strenuous activity and heavy lifting until your doctor tell you it's okay. Avoid activities such as vacuuming or swinging a golf club. You can drive after one week if you are comfortable and you are no longer taking prescription pain medications. It is normal to feel tired for serval weeks after your surgery. It is also normal to have difficulty with sleep habits, eating, and bowel movements after surgery. These will go away with time.  Bathing/Showering  Shower daily after you go home. Do not soak in a bathtub, hot tub, or swim until the incision heals completely.  Incision Care  Shower every day. Clean your incision with mild soap and water. Pat the area dry with a clean towel. You do not need a bandage unless otherwise instructed. Do not apply any ointments or creams to your incision. You may have skin glue on your incision. Do not peel it off. It will come off on its own in about one week. Your incision may feel thickened and raised for several weeks after your surgery. This is normal and the skin will soften over time.   For Men Only: It's okay to shave around the incision but do not shave the incision itself for 2 weeks. It is common to have numbness under your chin that could last for several months.  Diet  Resume your normal diet. There are no special food restrictions following this procedure. A low fat/low cholesterol diet is recommended for all patients with vascular disease. In order to heal from your surgery, it is CRITICAL to get adequate nutrition. Your body requires vitamins, minerals, and protein. Vegetables are the best source of  vitamins and minerals. Vegetables also provide the perfect balance of protein. Processed food has little nutritional value, so try to avoid this.  Medications  Resume taking all of your medications unless your doctor or physician assistant tells you not to. If your incision is causing pain, you may take over-the- counter pain relievers such as acetaminophen (Tylenol). If you were prescribed a stronger pain medication, please be aware these medications can cause nausea and constipation. Prevent nausea by taking the medication with a snack or meal. Avoid constipation by drinking plenty of fluids and eating foods with a high amount of fiber, such as fruits, vegetables, and grains.   Do not take Tylenol if you are taking prescription pain medications that also contain Tylenol.  Follow Up  Our office will schedule a follow up appointment 2-3 weeks following discharge.  Please call us immediately for any of the following conditions  Increased pain, redness, drainage (pus) from your incision site. Fever of 101 degrees or higher. If you should develop stroke (slurred speech, difficulty swallowing, weakness on one side of your body, loss of vision) you should call 911 and go to the nearest emergency room.  Reduce your risk of vascular disease:  Stop smoking. If you would like help call [facility] at 1-800-QUIT-NOW (952)769-7983) or [place] at 212-174-9497. Manage your cholesterol Maintain a desired weight Control your diabetes Keep your blood pressure down  If you have any questions, please call the office at [phone].

## 2021-12-10 NOTE — Progress Notes (Signed)
  Progress Note    12/10/2021 5:57 AM 1 Day Post-Op  Subjective:  no major complaints. Left neck soreness. Tolerated ambulating. Tolerating diet   Vitals:   12/09/21 2341 12/10/21 0305  BP: (!) 144/61 (!) 155/67  Pulse: 83 71  Resp: 12 15  Temp: 98.5 F (36.9 C) 98.2 F (36.8 C)  SpO2: 93% 94%   Physical Exam: General:well appearing, sitting up in chair  Cardiac:  regular Lungs:  non labored Incisions:  left neck incision is clean, dry and intact without swelling or hematoma Extremities:  moving all extremities without deficits Neurologic: alert and oriented. Tongue midline. Speech coherent  CBC    Component Value Date/Time   WBC 20.2 (H) 12/10/2021 0312   RBC 3.65 (L) 12/10/2021 0312   HGB 11.2 (L) 12/10/2021 0312   HCT 34.1 (L) 12/10/2021 0312   PLT 219 12/10/2021 0312   MCV 93.4 12/10/2021 0312   MCH 30.7 12/10/2021 0312   MCHC 32.8 12/10/2021 0312   RDW 13.8 12/10/2021 0312   LYMPHSABS 4.9 (H) 08/25/2021 1359   MONOABS 0.8 08/25/2021 1359   EOSABS 0.3 08/25/2021 1359   BASOSABS 0.1 08/25/2021 1359    BMET    Component Value Date/Time   NA 140 12/10/2021 0312   K 4.2 12/10/2021 0312   CL 110 12/10/2021 0312   CO2 24 12/10/2021 0312   GLUCOSE 131 (H) 12/10/2021 0312   BUN 13 12/10/2021 0312   CREATININE 0.65 12/10/2021 0312   CREATININE 0.61 10/14/2018 1044   CALCIUM 8.2 (L) 12/10/2021 0312   GFRNONAA >60 12/10/2021 0312   GFRAA >60 06/27/2019 1022    INR    Component Value Date/Time   INR 1.0 12/07/2021 0918     Intake/Output Summary (Last 24 hours) at 12/10/2021 0557 Last data filed at 12/10/2021 0300 Gross per 24 hour  Intake 1540 ml  Output 3800 ml  Net -2260 ml     Assessment/Plan:  56 y.o. female is s/p left CEA 1 Day Post-Op   Neurologically intact Left neck incision is well appearing without swelling or hematoma Continue Aspirin, statin Tolerating diet, tolerating ambulation She is stable for discharge home today She will  follow up in 2-3 weeks for incision check    Karoline Caldwell, PA-C [facility] [phone] 12/10/2021 5:57 AM

## 2021-12-11 ENCOUNTER — Encounter (HOSPITAL_COMMUNITY): Payer: Self-pay | Admitting: Vascular Surgery

## 2021-12-13 ENCOUNTER — Telehealth: Payer: Self-pay
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
## 2021-12-28 ENCOUNTER — Ambulatory Visit (INDEPENDENT_AMBULATORY_CARE_PROVIDER_SITE_OTHER): Payer: 59 | Admitting: Vascular Surgery

## 2021-12-28 ENCOUNTER — Encounter: Payer: Self-pay | Admitting: Vascular Surgery

## 2021-12-28 VITALS — BP 119/55 | HR 74 | Temp 98.2°F | Resp 20 | Ht 64.0 in | Wt 167.0 lb

## 2021-12-28 DIAGNOSIS — I779 Disorder of arteries and arterioles, unspecified: Secondary | ICD-10-CM

## 2021-12-28 NOTE — Progress Notes (Signed)
     Subjective:     Patient ID: Ihor Austin, female   DOB: February 17, 1966, 56 y.o.   MRN: [id number]  HPI 56 year old female status post left carotid endarterectomy for asymptomatic disease.  She does have some numbness in the left neck but overall has returned to work and is back to her usual level of activity.   Review of Systems Numbness left neck    Objective:   Physical Exam Vitals:   12/28/21 0917  BP: (!) 119/55  Pulse: 74  Resp: 20  Temp: 98.2 F (36.8 C)  SpO2: 93%   Awake alert oriented Movement respirations Moving all extremities well without limitations    Assessment:     56 year old female status post left carotid endarterectomy now recovering well.  Plan:     Follow-up in 9 months with repeat carotid duplex unless there are issues prior.    Zilphia Kozinski C. Donzetta Matters, MD [facility] [place] Office: [phone] Pager: [phone]

## 2022-01-02 ENCOUNTER — Other Ambulatory Visit: Payer: Self-pay | Admitting: *Deleted

## 2022-01-02 DIAGNOSIS — I779 Disorder of arteries and arterioles, unspecified: Secondary | ICD-10-CM

## 2022-01-04 ENCOUNTER — Encounter: Payer: 59 | Admitting: Vascular Surgery

## 2022-06-10 ENCOUNTER — Telehealth: Payer: Self-pay | Admitting: Student

## 2022-06-10 MED ORDER — BENAZEPRIL HCL 10 MG PO TABS: 10.0000 mg | ORAL_TABLET | Freq: Every day | ORAL | 0 refills | Status: DC

## 2022-06-10 NOTE — Telephone Encounter (Signed)
   Patient called Answering Service requesting a refill of her Benazepril She typically gets her medications delivered from the [place]. However, her refill has not arrived yet and she took her last dose today. She requested that I call the [place] help desk to give approval of sending in short-term prescription to local Pharmacy until her mail order can arrive. Spoke with [facility] as requested. They said it is actually the local Pharmacy who will need to call to get approval to fill but that I can go ahead and send in prescription to local Pharmacy. Will send in 30 day prescription of Benazepril '10mg'$  [facility] [place] as requested.  Darreld Mclean, PA-C 06/10/2022 3:13 PM

## 2022-06-28 ENCOUNTER — Other Ambulatory Visit: Payer: Self-pay | Admitting: Internal Medicine

## 2022-06-28 DIAGNOSIS — Z1231 Encounter for screening mammogram for malignant neoplasm of breast: Secondary | ICD-10-CM

## 2022-07-07 ENCOUNTER — Other Ambulatory Visit: Payer: Self-pay | Admitting: Student

## 2022-07-07 ENCOUNTER — Ambulatory Visit
Admission: RE | Admit: 2022-07-07 | Discharge: 2022-07-07 | Disposition: A | Discharge status: Home / Self Care | Payer: 59 | Source: Ambulatory Visit | Attending: Internal Medicine | Admitting: Internal Medicine

## 2022-07-07 DIAGNOSIS — Z1231 Encounter for screening mammogram for malignant neoplasm of breast: Secondary | ICD-10-CM

## 2022-07-13 ENCOUNTER — Other Ambulatory Visit: Payer: Self-pay | Admitting: Cardiology

## 2022-08-23 ENCOUNTER — Ambulatory Visit: Payer: 59

## 2022-08-24 ENCOUNTER — Encounter: Payer: Self-pay | Admitting: Orthopaedic Surgery

## 2022-08-24 ENCOUNTER — Ambulatory Visit (INDEPENDENT_AMBULATORY_CARE_PROVIDER_SITE_OTHER): Payer: Self-pay | Admitting: Orthopaedic Surgery

## 2022-08-24 ENCOUNTER — Ambulatory Visit (INDEPENDENT_AMBULATORY_CARE_PROVIDER_SITE_OTHER): Payer: 59

## 2022-08-24 DIAGNOSIS — M25552 Pain in left hip: Secondary | ICD-10-CM | POA: Diagnosis not present

## 2022-08-24 DIAGNOSIS — Z96642 Presence of left artificial hip joint: Secondary | ICD-10-CM
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
## 2022-08-25 ENCOUNTER — Telehealth: Payer: Self-pay | Admitting: Orthopaedic Surgery

## 2022-08-25 NOTE — Telephone Encounter (Signed)
FYI Spoke with patient advised I emailed medical records release form to her. Asked her to email form back so that her office note from yesterdays visit can be emailed to her.
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
## 2022-08-28 ENCOUNTER — Other Ambulatory Visit: Payer: Self-pay | Admitting: Cardiology

## 2022-09-20 ENCOUNTER — Telehealth: Payer: Self-pay | Admitting: Cardiology

## 2022-09-20 ENCOUNTER — Other Ambulatory Visit: Payer: Self-pay

## 2022-09-20 MED ORDER — ATORVASTATIN CALCIUM 80 MG PO TABS: 80.0000 mg | ORAL_TABLET | Freq: Every day | ORAL | 0 refills | Status: DC

## 2022-09-20 MED ORDER — METOPROLOL TARTRATE 25 MG PO TABS: 25.0000 mg | ORAL_TABLET | Freq: Two times a day (BID) | ORAL | 0 refills | Status: DC

## 2022-09-20 MED ORDER — BENAZEPRIL HCL 10 MG PO TABS: ORAL_TABLET | ORAL | 0 refills | Status: DC

## 2022-09-20 NOTE — Telephone Encounter (Signed)
*  STAT* If patient is at the pharmacy, call can be transferred to refill team.   1. Which medications need to be refilled? (please list name of each medication and dose if known) atorvastatin (LIPITOR) 80 MG tablet   benazepril (LOTENSIN) 10 MG tablet   metoprolol tartrate (LOPRESSOR) 25 MG tablet   2. Which pharmacy/location (including street and city if local pharmacy) is medication to be sent to? [facility] [place] [place] [facility]   3. Do they need a 30 day or 90 day supply? [place]

## 2022-09-20 NOTE — Addendum Note (Signed)
Addended by: Merlene Laughter on: 09/20/2022 04:22 PM   Modules accepted: Orders

## 2022-09-28 ENCOUNTER — Other Ambulatory Visit: Payer: Self-pay | Admitting: Cardiology

## 2022-10-17 ENCOUNTER — Encounter: Payer: Self-pay | Admitting: Nurse Practitioner

## 2022-10-17 ENCOUNTER — Ambulatory Visit: Payer: 59 | Attending: Nurse Practitioner | Admitting: Nurse Practitioner

## 2022-10-17 VITALS — BP 122/72 | HR 78 | Ht 64.0 in | Wt 166.2 lb

## 2022-10-17 DIAGNOSIS — I2510 Atherosclerotic heart disease of native coronary artery without angina pectoris: Secondary | ICD-10-CM

## 2022-10-17 DIAGNOSIS — E785 Hyperlipidemia, unspecified: Secondary | ICD-10-CM

## 2022-10-17 DIAGNOSIS — I10 Essential (primary) hypertension: Secondary | ICD-10-CM

## 2022-10-17 DIAGNOSIS — I25118 Atherosclerotic heart disease of native coronary artery with other forms of angina pectoris: Secondary | ICD-10-CM

## 2022-10-17 DIAGNOSIS — I739 Peripheral vascular disease, unspecified: Secondary | ICD-10-CM | POA: Diagnosis not present

## 2022-10-17 MED ORDER — BENAZEPRIL HCL 10 MG PO TABS: 10.0000 mg | ORAL_TABLET | Freq: Every day | ORAL | 3 refills | Status: DC

## 2022-10-17 MED ORDER — ASPIRIN 81 MG PO TBEC: 81.0000 mg | DELAYED_RELEASE_TABLET | Freq: Every day | ORAL | 3 refills | Status: DC

## 2022-10-17 MED ORDER — METOPROLOL TARTRATE 25 MG PO TABS: 25.0000 mg | ORAL_TABLET | Freq: Two times a day (BID) | ORAL | 3 refills | Status: DC

## 2022-10-17 MED ORDER — ASPIRIN 81 MG PO TBEC: 162.0000 mg | DELAYED_RELEASE_TABLET | Freq: Every day | ORAL | 3 refills | Status: DC

## 2022-10-17 MED ORDER — ATORVASTATIN CALCIUM 80 MG PO TABS: 80.0000 mg | ORAL_TABLET | Freq: Every day | ORAL | 3 refills | Status: DC

## 2022-10-17 MED ORDER — ASPIRIN 81 MG PO TBEC: 162.0000 mg | DELAYED_RELEASE_TABLET | Freq: Every day | ORAL | 3 refills | Status: AC
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
## 2022-10-17 NOTE — Patient Instructions (Addendum)

## 2022-10-30 ENCOUNTER — Other Ambulatory Visit: Payer: Self-pay | Admitting: Cardiology

## 2022-11-15 ENCOUNTER — Ambulatory Visit (HOSPITAL_COMMUNITY)
Admission: RE | Admit: 2022-11-15 | Discharge: 2022-11-15 | Disposition: A | Discharge status: Home / Self Care | Payer: 59 | Source: Ambulatory Visit | Attending: Vascular Surgery | Admitting: Vascular Surgery

## 2022-11-15 ENCOUNTER — Ambulatory Visit: Payer: 59 | Admitting: Physician Assistant

## 2022-11-15 VITALS — BP 127/78 | HR 71 | Temp 97.6°F | Wt 168.0 lb

## 2022-11-15 DIAGNOSIS — I779 Disorder of arteries and arterioles, unspecified: Secondary | ICD-10-CM | POA: Insufficient documentation
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
## 2023-06-26 ENCOUNTER — Other Ambulatory Visit: Payer: Self-pay | Admitting: Internal Medicine

## 2023-06-26 DIAGNOSIS — Z1231 Encounter for screening mammogram for malignant neoplasm of breast: Secondary | ICD-10-CM

## 2023-07-12 ENCOUNTER — Ambulatory Visit: Payer: 59

## 2023-07-24 ENCOUNTER — Ambulatory Visit
Admission: RE | Admit: 2023-07-24 | Discharge: 2023-07-24 | Disposition: A | Discharge status: Home / Self Care | Payer: 59 | Source: Ambulatory Visit | Attending: Internal Medicine | Admitting: Internal Medicine

## 2023-07-24 DIAGNOSIS — Z1231 Encounter for screening mammogram for malignant neoplasm of breast: Secondary | ICD-10-CM

## 2023-08-04 ENCOUNTER — Telehealth: Payer: 59 | Admitting: Family Medicine

## 2023-08-04 DIAGNOSIS — U071 COVID-19: Secondary | ICD-10-CM

## 2023-08-04 MED ORDER — NIRMATRELVIR/RITONAVIR (PAXLOVID) TABLET (RENAL DOSING): 2.0000 | ORAL_TABLET | Freq: Two times a day (BID) | ORAL | 0 refills | Status: AC

## 2023-08-04 NOTE — Patient Instructions (Signed)
 [name], thank you for joining [name], PA-C for today's virtual visit.  While this provider is not your primary care provider (PCP), if your PCP is located in our provider database this encounter information will be shared with them immediately following your visit.   A [facility] MyChart account gives you access to today's visit and all your visits, tests, and labs performed at [facility]  click here if you don't have a [facility] MyChart account or go to [email or web address]  Consent: (Patient) [name] provided verbal consent for this virtual visit at the beginning of the encounter.  Current Medications:  Current Outpatient Medications:    acetaminophen  (TYLENOL ) 500 MG tablet, Take 500 mg by mouth every 6 (six) hours as needed. Rapid release, Disp: , Rfl:    ALPRAZolam  (XANAX ) 1 MG tablet, Take 2 mg by mouth at bedtime., Disp: , Rfl:    Ascorbic Acid  (VITAMIN C ) 1000 MG tablet, Take 1,000 mg by mouth daily., Disp: , Rfl:    aspirin  EC 81 MG tablet, Take 2 tablets (162 mg total) by mouth daily. Swallow whole., Disp: 180 tablet, Rfl: 3   atorvastatin  (LIPITOR ) 80 MG tablet, Take 1 tablet (80 mg total) by mouth daily., Disp: 90 tablet, Rfl: 3   benazepril  (LOTENSIN ) 10 MG tablet, Take 1 tablet (10 mg total) by mouth daily., Disp: 90 tablet, Rfl: 3   JANUMET  50-1000 MG tablet, Take 1 tablet by mouth 2 (two) times daily., Disp: , Rfl:    metoprolol  tartrate (LOPRESSOR ) 25 MG tablet, Take 1 tablet (25 mg total) by mouth 2 (two) times daily., Disp: 180 tablet, Rfl: 3   nitroGLYCERIN  (NITROSTAT ) 0.4 MG SL tablet, Place 1 tablet (0.4 mg total) under the tongue every 5 (five) minutes as needed. Chest pain, Disp: 25 tablet, Rfl: 3   Omega-3 1000 MG CAPS, Take 3,000 mg by mouth daily., Disp: , Rfl:    Oxycodone  HCl 20 MG TABS, Take 20 mg by mouth 2 (two) times daily., Disp: , Rfl:    Turmeric 500 MG CAPS, Take 500 mg by mouth daily., Disp: , Rfl:    Medications  ordered in this encounter:  No orders of the defined types were placed in this encounter.    *If you need refills on other medications prior to your next appointment, please contact your pharmacy*  Follow-Up: Call back or seek an in-person evaluation if the symptoms worsen or if the condition fails to improve as anticipated.  [facility] [phone]  Other Instructions COVID-19 COVID-19 is an infection caused by a virus called SARS-CoV-2. This type of virus is called a coronavirus. People with COVID-19 may: Have little to no symptoms. Have mild to moderate symptoms that affect their lungs and breathing. Get very sick. What are the causes? COVID-19 is caused by a virus. This virus may be in the air as droplets or on surfaces. It can spread from an infected person when they cough, sneeze, speak, sing, or breathe. You may become infected if: You breathe in the infected droplets in the air. You touch an object that has the virus on it. What increases the risk? You are at risk of getting COVID-19 if you have been around someone with the infection. You may be more likely to get very sick if: You are 45 years old or older. You have certain medical conditions, such as: Heart disease. Diabetes. Chronic respiratory disease. Cancer. Pregnancy. You are immunocompromised. This means your body cannot fight  infections easily. You have a disability or trouble moving, meaning you're immobile. What are the signs or symptoms? People may have different symptoms from COVID-19. The symptoms can also be mild to severe. They often show up in 5-6 days after being infected. But they can take up to 14 days to appear. Common symptoms are: Cough. Feeling tired. New loss of taste or smell. Fever. Less common symptoms are: Sore throat. Headache. Body or muscle aches. Diarrhea. A skin rash or odd-colored fingers or toes. Red or irritated eyes. Sometimes, COVID-19 does not cause  symptoms. How is this diagnosed? COVID-19 can be diagnosed with tests done in the lab or at home. Fluid from your nose, mouth, or lungs will be used to check for the virus. How is this treated? Treatment for COVID-19 depends on how sick you are. Mild symptoms can be treated at home with rest, fluids, and over-the-counter medicines. Severe symptoms may be treated in a hospital intensive care unit (ICU). If you have symptoms and are at risk of getting very sick, you may be given a medicine that fights viruses. This medicine is called an antiviral. How is this prevented? To protect yourself from COVID-19: Know your risk factors. Get vaccinated. If your body cannot fight infections easily, talk to your provider about treatment to help prevent COVID-19. Stay at least 1 meter away from others. Wear a well-fitted mask when: You can't stay at a distance from people. You're in a place with poor air flow. Try to be in open spaces with good air flow when in public. Wash your hands often or use an alcohol-based hand sanitizer. Cover your nose and mouth when coughing and sneezing. If you think you have COVID-19 or have been around someone who has it, stay home and be by yourself for 5-10 days. Where to find more information Centers for Disease Control and Prevention (CDC): tonerpromos.no World Health Organization [facility]): visitdestination.com.br Get help right away if: You have trouble breathing or get short of breath. You have pain or pressure in your chest. You cannot speak or move any part of your body. You are confused. Your symptoms get worse. These symptoms may be an emergency. Get help right away. Call 911. Do not wait to see if the symptoms will go away. Do not drive yourself to the hospital. This information is not intended to replace advice given to you by your health care provider. Make sure you discuss any questions you have with your health care provider. Document Revised: 07/18/2022 Document Reviewed:  03/24/2022 Elsevier Patient Education  2024 Elsevier Inc.    If you have been instructed to have an in-person evaluation today at a local Urgent Care facility, please use the link below. It will take you to a list of all of our available [facility], including address, phone number and hours of operation. Please do not delay care.  [facility]  If you or a family member do not have a primary care provider, use the link below to schedule a visit and establish care. When you choose a [facility] primary care physician or advanced practice provider, you gain a long-term partner in health. Find a Primary Care Provider  Learn more about [facility] in-office and virtual care options: [facility] - Get Care Now
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
## 2023-08-10 ENCOUNTER — Other Ambulatory Visit: Payer: Self-pay | Admitting: Nurse Practitioner

## 2023-08-12 ENCOUNTER — Other Ambulatory Visit: Payer: Self-pay | Admitting: Nurse Practitioner

## 2023-09-20 ENCOUNTER — Ambulatory Visit: Payer: Self-pay | Admitting: Internal Medicine

## 2023-09-20 NOTE — Telephone Encounter (Signed)
 This pt was transferred to this RN in error. The pt was not looking to establish care with a PCP as she already has a provider. She was seeking GYN appt for a PAP smear. Pt advised to reach out to her PCP for referral. Pt has complicated home situation and resources were provided to the pt. Pt has no additional needs at this time and is agreeable to plan.  Copied from CRM [id number]. Topic: Clinical - Red Word Triage >> Sep 20, 2023  3:57 PM Gildardo Pounds wrote: Red Word that prompted transfer to Nurse Triage: tingling sensation when urinating; tested positive for herpes 1 and 2 and does not know how to react to the information. States she is an emotional wreck right now. She is having her first grandchild soon. Taking 500 mg antibiotics for infection. Vaginal issues that. Patient needs guidance. Reason for Disposition  General information question, no triage required and triager able to answer question  Answer Assessment - Initial Assessment Questions 1. REASON FOR CALL or QUESTION: "What is your reason for calling today?" or "How can I best help you?" or "What question do you have that I can help answer?"     Pt calling in error, not a pt here, has established PCP at different practice and will call there  Protocols used: Information Only Call - No Triage-A-AH

## 2023-09-28 ENCOUNTER — Encounter: Payer: Self-pay | Admitting: Cardiology

## 2023-10-08 ENCOUNTER — Ambulatory Visit: Payer: 59 | Admitting: Physician Assistant

## 2023-10-24 ENCOUNTER — Other Ambulatory Visit: Payer: Self-pay | Admitting: Nurse Practitioner

## 2023-10-29 ENCOUNTER — Ambulatory Visit: Admitting: Urology

## 2023-11-30 ENCOUNTER — Other Ambulatory Visit: Payer: Self-pay | Admitting: *Deleted

## 2023-11-30 DIAGNOSIS — I779 Disorder of arteries and arterioles, unspecified: Secondary | ICD-10-CM

## 2023-12-12 ENCOUNTER — Ambulatory Visit (HOSPITAL_COMMUNITY)
Admission: RE | Admit: 2023-12-12 | Discharge: 2023-12-12 | Disposition: A | Discharge status: Home / Self Care | Payer: 59 | Source: Ambulatory Visit | Attending: Vascular Surgery | Admitting: Vascular Surgery

## 2023-12-12 ENCOUNTER — Ambulatory Visit: Payer: 59 | Attending: Vascular Surgery | Admitting: Physician Assistant

## 2023-12-12 ENCOUNTER — Encounter: Payer: Self-pay | Admitting: Physician Assistant

## 2023-12-12 VITALS — BP 114/77 | HR 73 | Temp 98.3°F | Wt 168.4 lb

## 2023-12-12 DIAGNOSIS — I779 Disorder of arteries and arterioles, unspecified: Secondary | ICD-10-CM | POA: Insufficient documentation
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
## 2023-12-19 ENCOUNTER — Other Ambulatory Visit (HOSPITAL_COMMUNITY): Payer: Self-pay | Admitting: Internal Medicine

## 2023-12-19 DIAGNOSIS — E049 Nontoxic goiter, unspecified: Secondary | ICD-10-CM

## 2023-12-26 ENCOUNTER — Other Ambulatory Visit: Payer: Self-pay

## 2023-12-26 MED ORDER — ATORVASTATIN CALCIUM 80 MG PO TABS: 80.0000 mg | ORAL_TABLET | Freq: Every day | ORAL | 0 refills | Status: DC

## 2023-12-26 MED ORDER — METOPROLOL TARTRATE 25 MG PO TABS: 25.0000 mg | ORAL_TABLET | Freq: Two times a day (BID) | ORAL | 0 refills | Status: DC

## 2024-01-04 ENCOUNTER — Other Ambulatory Visit: Payer: Self-pay | Admitting: Nurse Practitioner

## 2024-01-10 ENCOUNTER — Other Ambulatory Visit: Payer: Self-pay | Admitting: Cardiology

## 2024-01-10 ENCOUNTER — Telehealth: Payer: Self-pay | Admitting: Cardiology

## 2024-01-10 ENCOUNTER — Ambulatory Visit: Attending: Cardiology | Admitting: Cardiology

## 2024-01-10 ENCOUNTER — Encounter: Payer: Self-pay | Admitting: Cardiology

## 2024-01-10 ENCOUNTER — Ambulatory Visit: Attending: Cardiology

## 2024-01-10 VITALS — BP 110/70 | HR 78 | Ht 65.0 in | Wt 170.0 lb

## 2024-01-10 DIAGNOSIS — E782 Mixed hyperlipidemia: Secondary | ICD-10-CM | POA: Diagnosis not present

## 2024-01-10 DIAGNOSIS — I2510 Atherosclerotic heart disease of native coronary artery without angina pectoris: Secondary | ICD-10-CM

## 2024-01-10 DIAGNOSIS — R002 Palpitations: Secondary | ICD-10-CM

## 2024-01-10 DIAGNOSIS — I10 Essential (primary) hypertension: Secondary | ICD-10-CM | POA: Diagnosis not present

## 2024-01-10 DIAGNOSIS — I739 Peripheral vascular disease, unspecified: Secondary | ICD-10-CM | POA: Diagnosis not present

## 2024-01-10 MED ORDER — ATORVASTATIN CALCIUM 80 MG PO TABS: 80.0000 mg | ORAL_TABLET | Freq: Every day | ORAL | 1 refills | Status: DC

## 2024-01-10 MED ORDER — METOPROLOL TARTRATE 25 MG PO TABS: 25.0000 mg | ORAL_TABLET | Freq: Two times a day (BID) | ORAL | 1 refills | Status: DC

## 2024-01-10 MED ORDER — BENAZEPRIL HCL 10 MG PO TABS: 10.0000 mg | ORAL_TABLET | Freq: Every day | ORAL | 1 refills | Status: DC

## 2024-01-10 NOTE — Telephone Encounter (Signed)
 Checking percert on the following   7 day zio - palps
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
## 2024-01-10 NOTE — Patient Instructions (Addendum)
 Medication Instructions:   Atorvastatin , Benazepril , Metoprolol  - refilled today for 90 day supply to [facility] Continue all other medications.     Labwork:  none  Testing/Procedures:  Your physician has recommended that you wear a 7 day event monitor. Event monitors are medical devices that record the heart's electrical activity. Doctors most often us  these monitors to diagnose arrhythmias. Arrhythmias are problems with the speed or rhythm of the heartbeat. The monitor is a small, portable device. You can wear one while you do your normal daily activities. This is usually used to diagnose what is causing palpitations/syncope (passing out). Office will contact with results via phone, letter or mychart.     Follow-Up:  6 months   Any Other Special Instructions Will Be Listed Below (If Applicable).   If you need a refill on your cardiac medications before your next appointment, please call your pharmacy.

## 2024-01-31 ENCOUNTER — Ambulatory Visit: Admitting: Cardiology

## 2024-03-04 DIAGNOSIS — R002 Palpitations: Secondary | ICD-10-CM | POA: Diagnosis not present

## 2024-03-19 ENCOUNTER — Ambulatory Visit: Payer: Self-pay | Admitting: Cardiology

## 2024-03-19 ENCOUNTER — Encounter: Payer: Self-pay | Admitting: *Deleted

## 2024-04-16 ENCOUNTER — Other Ambulatory Visit: Payer: Self-pay | Admitting: Internal Medicine

## 2024-04-16 DIAGNOSIS — Z1231 Encounter for screening mammogram for malignant neoplasm of breast: Secondary | ICD-10-CM

## 2024-05-08 ENCOUNTER — Encounter (HOSPITAL_COMMUNITY): Payer: Self-pay | Admitting: Registered Nurse

## 2024-05-08 ENCOUNTER — Ambulatory Visit (INDEPENDENT_AMBULATORY_CARE_PROVIDER_SITE_OTHER): Admitting: Registered Nurse

## 2024-05-08 DIAGNOSIS — F411 Generalized anxiety disorder: Secondary | ICD-10-CM | POA: Diagnosis not present

## 2024-05-08 DIAGNOSIS — F332 Major depressive disorder, recurrent severe without psychotic features: Secondary | ICD-10-CM | POA: Diagnosis not present

## 2024-05-08 DIAGNOSIS — G479 Sleep disorder, unspecified: Secondary | ICD-10-CM

## 2024-05-08 MED ORDER — TRAZODONE HCL 50 MG PO TABS: 25.0000 mg | ORAL_TABLET | Freq: Every day | ORAL | 0 refills | Status: AC

## 2024-05-08 MED ORDER — DULOXETINE HCL 30 MG PO CPEP: 30.0000 mg | ORAL_CAPSULE | Freq: Every day | ORAL | 1 refills | Status: AC
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
## 2024-05-08 NOTE — Patient Instructions (Addendum)
 If no one has contacted, you by the end of business day today please call the appropriate office listed below to schedule your next visit for medication management with Luisa Ruder, NP:    [facility] at [facility] [address], [address], [place], [place] [address]  5.4 mi Phone: [phone] (Call to schedule appointment)  [facility] at [facility] [address] [place], [place] [address]  25 mi Phone: [phone] (Call to schedule appointment)   Long-term use of benzodiazepines can lead to several adverse effects, including: Cognitive Impairment: Issues such as memory problems, decreased attention, and impaired cognitive abilities. Physical Health Risks: Increased risk of falls and fractures, especially in older adults.  Mental Health Issues: Mood swings, depression, and increased anxiety.  Dependence and Withdrawal: Physical dependence can develop, leading to withdrawal symptoms like irritability, sleep disturbances, and flu-like symptoms.  Social and Occupational Impact: Problems with employment and social interactions.  Gradual reduction under medical supervision is recommended to mitigate withdrawal symptoms.  Alternatives There are several alternatives to benzodiazepines for managing anxiety and related conditions. Some of the common options include: Selective Serotonin Reuptake Inhibitors (SSRIs): These medications, such as sertraline and fluoxetine, are often used as first-line treatments for anxiety disorders.  Serotonin-Norepinephrine Reuptake Inhibitors (SNRIs): Examples include venlafaxine and duloxetine, which are also effective for anxiety.  Buspirone: This is a non-benzodiazepine medication specifically for anxiety, with a lower risk of dependence.  Beta-Blockers: Medications like propranolol can help manage physical symptoms of anxiety, such as rapid heartbeat.  Pregabalin and Gabapentin : These  are used for anxiety and have a different mechanism of action compared to benzodiazepines.  Hydroxyzine: An antihistamine that can be used for anxiety.  Topic: Benzodiazepines and Opioids Polysubstance Use Risk: Combining opioids with other central nervous system depressants like benzodiazepines, alcohol, or xylazine significantly increases the risk of life-threatening overdose. Statistics: In 2021, nearly 14% of opioid-related overdose deaths also involved benzodiazepines. Mechanism: Benzodiazepines (e.g., Valium, Xanax , Klonopin) enhance GABA activity in the brain, leading to sedation. Illicit Drug Supply: Benzodiazepines have been found in illicit opioids, sometimes unknowingly consumed. Co-Prescribing Concerns Overdose Risk: Combining opioids and benzodiazepines can suppress breathing and impair cognition, increasing overdose risk. Emergency Visits: Concurrent use leads to higher rates of ER visits, hospital admissions, and overdose deaths.   Study: Patients prescribed both had a 10x higher overdose death rate than those on opioids alone. Veterans Study: Benzodiazepine prescriptions increased overdose risk in a dose-dependent manner. Guidelines and Warnings CDC Recommendations: Clinicians should exercise caution when prescribing both drugs together. FDA Boxed Warnings: Both drug types now carry warnings about the dangers of concurrent use. Patient Responsibility: Patients should disclose all medications and substances to their healthcare providers to manage risks.  Types of Psychotherapy Interpersonal Psychotherapy (IPT) Focuses on treating depression related to significant loss, life changes, or interpersonal conflict. Improves interpersonal relationships and social skills. Recommended for mood disorders, anxiety, PTSD, bipolar disorder, eating disorders, postpartum depression, and borderline personality disorder. Cognitive Behavioral Therapy (CBT) Identifies and  addresses negative thought patterns and beliefs. Goal-oriented and focuses on solving current challenges. Effective for addiction, depression, OCD, anxiety, chronic pain, bipolar disorder, anger management, personality disorders, eating disorders, marital conflict, and academic performance. Dialectical Behavioral Therapy (DBT) Balances acceptance and change by integrating opposing ideas. Helps identify and modify distressing behaviors. Initially developed for borderline personality disorder, but adapted for other conditions. Psychoanalytical and Psychodynamic Therapy Uncovers unconscious thoughts linked to childhood experiences. Long-term treatment performed by specially  trained professionals. Used for chronic depression, anxiety disorders, somatic disorders, borderline personality disorder, PTSD, substance use disorders, and eating disorders. Humanistic Therapy Focuses on self-awareness and acceptance to reach full potential. Person-centered approach allowing clients to guide sessions. Recommended for trauma, depression, chronic conditions, anxiety, low self-esteem, relationship conflicts, personality disorders, addictions, and existential crises. Eclectic Therapy Combines techniques from different types of psychotherapy. Flexible and adapts to individual needs and goals. Can be short-term or long-term, often used for PTSD symptoms. Choosing the Right Therapy Consider the type of concern, duration, previous diagnosis, and whether you want to understand or change behaviors. Consultations with multiple therapists can help determine the best approach. Check therapist credentials and training. Formats of Therapy Individual Therapy: One-on-one sessions. Group Therapy: Sessions with multiple participants sharing common goals. Couple's or Family Therapy: Sessions involving family members or partners. Effectiveness of Psychotherapy Rooted in science and evidence-based. Effective for a variety  of mental health conditions and personal challenges. Requires active participation and collaboration. Online Therapy As effective as in-person therapy. Ensure privacy, verify therapist credentials, and maintain a conducive environment for sessions. This overview should give you a good understanding of the different psychotherapy options and how they can help.    Call 911, [phone], mobile crisis, or present to the nearest emergency room should you experience any suicidal/homicidal ideation, auditory/visual/hallucinations, or detrimental worsening of your mental health.  Mobile Crisis Response Teams Listed by counties in vicinity of [facility] providers [facility] [phone] [facility] [phone] [facility] [phone] [facility] [phone] [place]                * [facility] [phone]                * [facility] [phone]  [facility] [phone] [facility] [employer]  [phone] * [facility] [phone]

## 2024-06-30 ENCOUNTER — Other Ambulatory Visit: Payer: Self-pay | Admitting: Cardiology

## 2024-07-10 ENCOUNTER — Ambulatory Visit: Admitting: Cardiology

## 2024-07-10 ENCOUNTER — Encounter: Payer: Self-pay | Admitting: Cardiology

## 2024-07-10 VITALS — BP 100/64 | HR 77 | Ht 64.0 in | Wt 165.0 lb

## 2024-07-10 DIAGNOSIS — I739 Peripheral vascular disease, unspecified: Secondary | ICD-10-CM | POA: Diagnosis not present

## 2024-07-10 DIAGNOSIS — I2510 Atherosclerotic heart disease of native coronary artery without angina pectoris: Secondary | ICD-10-CM | POA: Diagnosis not present

## 2024-07-10 DIAGNOSIS — I10 Essential (primary) hypertension: Secondary | ICD-10-CM

## 2024-07-10 DIAGNOSIS — E782 Mixed hyperlipidemia: Secondary | ICD-10-CM | POA: Diagnosis not present
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
## 2024-07-10 NOTE — Patient Instructions (Signed)

## 2024-08-01 ENCOUNTER — Ambulatory Visit
Admission: RE | Admit: 2024-08-01 | Discharge: 2024-08-01 | Disposition: A | Source: Ambulatory Visit | Attending: Internal Medicine | Admitting: Internal Medicine

## 2024-08-01 DIAGNOSIS — Z1231 Encounter for screening mammogram for malignant neoplasm of breast: Secondary | ICD-10-CM

## 2024-08-02 ENCOUNTER — Other Ambulatory Visit: Payer: Self-pay | Admitting: Cardiology
